# Patient Record
Sex: Female | Born: 1935 | Race: White | Hispanic: No | Smoking: Never smoker
Health system: Southern US, Community
[De-identification: ages and names within clinical notes are randomized; demographics above are authoritative.]

## PROBLEM LIST (undated history)

## (undated) DIAGNOSIS — M719 Bursopathy, unspecified: Secondary | ICD-10-CM

## (undated) HISTORY — DX: Bursopathy, unspecified: M71.9

---

## 2015-01-17 ENCOUNTER — Emergency Department
Admission: EM | Admit: 2015-01-17 | Discharge: 2015-01-17 | Disposition: A | Discharge status: Home / Self Care | Payer: Medicare Other | Attending: Emergency Medicine | Admitting: Emergency Medicine

## 2015-01-17 ENCOUNTER — Emergency Department: Payer: Medicare Other

## 2015-01-17 DIAGNOSIS — S0990XA Unspecified injury of head, initial encounter: Secondary | ICD-10-CM

## 2015-01-17 DIAGNOSIS — S0101XA Laceration without foreign body of scalp, initial encounter: Secondary | ICD-10-CM | POA: Insufficient documentation

## 2015-01-17 DIAGNOSIS — W19XXXA Unspecified fall, initial encounter: Secondary | ICD-10-CM | POA: Insufficient documentation

## 2015-01-17 MED ORDER — ACETAMINOPHEN 325 MG PO TABS
650.0000 mg | ORAL_TABLET | Freq: Once | ORAL | Status: AC
Start: 2015-01-17 — End: 2015-01-17
  Administered 2015-01-17: 650 mg via ORAL
  Filled 2015-01-17: qty 2

## 2015-01-17 NOTE — ED Provider Notes (Signed)
EMERGENCY DEPARTMENT HISTORY AND PHYSICAL EXAM    Date: 01/17/2015  Patient Name: Jessica Patton    History of Presenting Illness     Chief Complaint   Patient presents with   . Fall   . Head Laceration     BACK OF HEAD        History Provided By: Patient   Chief Complaint:  laceration  Onset: Immediately PTA  Timing: constant  Location: scalp (posterior)  Quality: denies pain  Severity:   Modifying Factors: None reported.  Associated Symptoms: Denies paresthesias, weakness in the affected area.  Tetanus UTD.    Additional History: Jessica Patton is a 81 y.o. female who was BIB family members with c/o laceration to the scalp.  This injury occurred when pt sustained mechanical fall, fell backward.  No LOC, neck pain, back pain, amnesia, blood thinners, amnesia of event, or c/o pain    PCP: Pcp, Noneorunknown, MD    No current facility-administered medications for this encounter.     Current Outpatient Prescriptions   Medication Sig Dispense Refill   . Celecoxib (CELEBREX PO) Take by mouth.     . Loratadine (CLARITIN PO) Take by mouth.     . Mirabegron (MYRBETRIQ PO) Take by mouth.       Past History     Past Medical History:  Past Medical History   Diagnosis Date   . Bursitis        Past Surgical History:  Past Surgical History   Procedure Laterality Date   . Back surgery     . Joint replacement       LEFT PARTIAL        Family History:  No family history on file.    Social History:  Social History   Substance Use Topics   . Smoking status: Never Smoker    . Smokeless tobacco: None   . Alcohol Use: Yes       Allergies:  Allergies   Allergen Reactions   . Bacitracin    . Neosporin Wound Cleanser [Benzalkonium Chloride]        Review of Systems   Review of Systems   Constitutional: Negative for fever and chills.   Musculoskeletal: Negative for joint pain and falls.   Skin: Negative for itching and rash.        + for laceration   Neurological: Negative for dizziness, tingling, sensory change, focal weakness and  weakness.   Endo/Heme/Allergies:        Negative for h/o DM     Physical Exam   BP 161/74 mmHg  Pulse 76  Temp(Src) 96.9 F (36.1 C) (Oral)  Resp 18  Ht 5\' 7"  (1.702 m)  Wt 68.04 kg  BMI 23.49 kg/m2  SpO2 98%    CONSTITUTIONAL: Well-developed, well-nourished. Alert, oriented, NAD. Vital signs reviewed. Vital signs stable.  HEAD: Normocephalic.   There is no bony tenderness, deformity, crepitus, or instability noted to skull or facial bones  EENT: PERRL, EOMs intact, red reflex + bilat.  TMs clear with normal cone of light bilat.  No pain with tragus depression or tug test.  No hemotympanum.  MS: No bony tenderness. Full ROM without pain. No edema, erythema.  SKIN: Warm, dry. No rash or lesions. Pt has 1cm linear partial thickness laceration to the scalp.  Edges are tidy and there is no foreign body noted.  2 point discrimination intact distal to wound.  There is no joint space involvement.  There is no  tendon, nerve, or vascular involvement evident during exam.  NEURO: A&O x 3. CN II-XII intact. 5/5 strength in bilateral upper and lower extremities. Sensory intact throughout. Point to point and heel to shin performed without Difficulty. Romberg and pronator drift negative. Gait steady and balanced.  GCS 15.  PSYCH: Normal affect, insight and concentration for age.    Diagnostic Study Results     Labs -     Results     ** No results found for the last 24 hours. **          Radiologic Studies -   Radiology Results (24 Hour)     Procedure Component Value Units Date/Time    CT Head without Contrast [[id number]] Collected:  01/17/15 0114    Order Status:  Completed Updated:  01/17/15 0119    Narrative:      History: Fall posterior head injury.    Comparisons: None.    Technique: Axial CT brain obtained from vertex to skull base without  contrast.     A combination of automatic exposure control, adjustment of the mA and/or  kV according to patient size and/or use of iterative reconstruction  technique was  utilized.    Findings:  Gray-white matter differentiation preserved.        Basal ganglia, thalami, midbrain, pons unremarkable.    No mass, mass effect, or midline shift. No intra-axial or extra-axial  hemorrhage or collection.    Ventricles, sulci, cisterns age-appropriate in size and configuration.    Calvarium appears intact.         Small hematoma left posterior scalp.      Impression:         Small left posterior scalp hematoma.    Adaline Sill, MD   01/17/2015 1:15 AM          Medical Decision Making   I am the first provider for this patient.    I reviewed the available nursing notes, past medical history, past surgical history, family history and social history.       Patient Vitals for the past 12 hrs:   BP Temp Pulse Resp   01/17/15 0011 161/74 mmHg (!) 96.9 F (36.1 C) 76 18   01/17/15 0007 - - - 18     Pulse Oximetry Analysis - Normal 98% on RA      Provider Notes:   Simple laceration noted to scalp.  Repaired without difficulty and with good cosmesis.  Tetanus UTD.  Pt educated on home wound care, when to return for suture removal.  Stable to d/c home at this time.  Dr. Layla Barter in agreement with POC.  Pt educated on return precautions.  D/C instructions provided to pt - verbalized understanding.  Business card provided to call with any questions or concerns.  Educated on home care post-head injury and importance of close monitoring.  Neuro exam non focal  Throughout ED visit and CT neg.     Lac Repair  Date/Time: 01/17/2015 2:07 AM  Performed by: Charlton Haws  Authorized by: Tempie Donning B    Consent:     Consent obtained:  Verbal    Consent given by:  Patient    Risks discussed:  Infection, pain, poor cosmetic result, poor wound healing, vascular damage, need for additional repair and nerve damage  Universal protocol:     Procedure explained and questions answered to patient or proxy's satisfaction: yes      Site/side marked: yes  Immediately prior to procedure, a time out was  called: yes      Patient identity confirmed:  Arm band  Anesthesia (see MAR for exact dosages):     Anesthesia method:  Local infiltration    Local anesthetic:  Lidocaine 1% WITH epi  Laceration details:     Location:  Scalp    Scalp location:  Occipital    Length (cm):  0.5    Depth (mm):  1  Repair type:     Repair type:  Simple  Exploration:     Hemostasis achieved with:  Direct pressure    Wound exploration: entire depth of wound probed and visualized      Wound extent: no foreign bodies/material noted, no muscle damage noted, no nerve damage noted, no tendon damage noted, no underlying fracture noted and no vascular damage noted      Contaminated: no    Treatment:     Area cleansed with:  Saline and Shur-Clens    Amount of cleaning:  Standard    Irrigation solution:  Sterile saline    Irrigation method:  Syringe    Visualized foreign bodies/material removed: no    Skin repair:     Repair method:  Staples    Number of staples:  4  Approximation:     Approximation:  Close    Diagnosis     Clinical Impression:   1. Scalp laceration, initial encounter    2. Head injury, initial encounter        _______________________________              Charlton Haws, NP  01/17/15 0221    Jethro Bastos, MD  01/17/15 [phone]

## 2015-01-17 NOTE — Discharge Instructions (Signed)
Thank you for trusting us with your care today, and thank you for your patience while you have been here in the emergency department.  Please do not hesitate to call or return if you have any questions or concerns, or for any change in status.    Laceration, Scalp    You have been seen today and treated for a scalp laceration (cut).    The wound has been repaired with surgical staples.    Follow up with your doctor OR come back here or go to the nearest Emergency Department for staple/suture removal in:   10 days.    Use the following wound care instructions:   Keep the wound clean and dry for the next 24 hours. Avoid excessive moisture. You can wash the wound gently with soap and water and Then keep the area as clean and dry as possible.   DO NOT let your wound to soak in water (bathing in a bathtub, using a hot tub or going swimming, for example).   You can shower. However, be careful not to rub your staples/stitches too hard (do not be too abrasive).   Let the wound dry before putting another bandage on (if using a bandage).   To help take off a scab, cleanse the area with a half hydrogen peroxide/half water mix. This will also help us to take out the staples/sutures when they are ready to be taken out.   Unless you were told not to, you can put a thin layer of antibiotic ointment over the wound. You can buy Polysporin, Bacitracin, or Neosporin over the counter (without a prescription). Neosporin can sometimes irritate the skin. If this happens, stop using it and switch to another topical antibiotic.    Keep the affected area elevated for the next 24 hours to lower swelling and pain. You may also want to put ice on the area. Put some ice cubes in a re-sealable (Ziploc) bag and add some water. Put a thin washcloth between the bag and the skin. Apply the ice bag to the area for at least 20 minutes. Do this at least 4 times per day. Longer times and more often are OK. NEVER APPLY ICE DIRECTLY TO THE  SKIN.    If you had a local anesthetic, it will wear off in about 2 hours. Until then, be careful not to hurt yourself because of less feeling in the area.    YOU SHOULD SEEK MEDICAL ATTENTION IMMEDIATELY, EITHER HERE OR AT THE NEAREST EMERGENCY DEPARTMENT, IF ANY OF THE FOLLOWING OCCURS:   Unusual redness or swelling.   Red streaks or red patches around the wound.   The wound smells bad or has a lot of drainage.   Fever (temperature higher than 100.4F / 38C), chills, increasing pain or swelling.    Head Injury, NOS    You have been seen for a head injury.    A head injury can happen after something strikes the head or as a result of a fall or other injury. Head injuries can range from mild injuries to more severe injuries. The more severe injuries can result in broken bones or injury to the brain itself. Mild head injuries will show no abnormalities if a CT (CAT) scan of the brain is done.     Although you had an injury to your head, you do not seem to have a serious brain injury.     Head injury symptoms can last from hours to months. The time depends   on how bad the injury was. It also depends on whether you ve had a concussion in the past. Some problems with a concussion can include: Sleep, memory and concentration problems. They also include chronic (ongoing) headaches and sensitivity to light. These symptoms can happen soon after the concussion. They can also develop slowly over time. They can last up to a year. When this happens, it is called "post concussion syndrome."    If you develop "post-concussive syndrome," you should follow up with your doctor. Your doctor can care for you or provide a referral to a head-injury specialist.    Treatment includes observation at home and pain medicine like acetaminophen (Tylenol) or ibuprofen (Advil or Motrin). Prescription pain medicine is probably not needed.    You might have a mild headache for a few days.    Over the next 24 hours:   Stay with  family or friends who can watch your behavior.   Avoid alcohol or drugs.    YOU SHOULD SEEK MEDICAL ATTENTION IMMEDIATELY, EITHER HERE OR AT THE NEAREST EMERGENCY DEPARTMENT, IF ANY OF THE FOLLOWING OCCURS:   Your headache gets worse.   Your headache pain changes.   You have fever (temperature higher than 100.4F / 38C), neck pain, vision changes, difficulty walking or change of behavior.   You feel numbness, tingling, weakness in your arms or legs.   You faint.   Your vision changes.   You vomit often or cannot keep medicine down.   You are confused or have difficulty waking from sleep.

## 2015-01-17 NOTE — ED Notes (Signed)
Jessica Patton is a 81 y.o. female FROM HOME C/O FALL AND LAC TO BACK OF HEAD, NO LOC.  PT TRIPPED AND  FELL BACKWARDS ONTO GRANITE BAR.  NO LOC . DENIES DIZZINESS .

## 2019-07-29 ENCOUNTER — Emergency Department (HOSPITAL_COMMUNITY)
Admission: EM | Admit: 2019-07-29 | Discharge: 2019-07-29 | Disposition: A | Discharge status: Home / Self Care | Payer: Medicare Other | Attending: Emergency Medicine | Admitting: Emergency Medicine

## 2019-07-29 ENCOUNTER — Other Ambulatory Visit: Payer: Self-pay

## 2019-07-29 ENCOUNTER — Encounter (HOSPITAL_COMMUNITY): Payer: Self-pay | Admitting: Emergency Medicine

## 2019-07-29 ENCOUNTER — Emergency Department (HOSPITAL_COMMUNITY): Payer: Medicare Other

## 2019-07-29 DIAGNOSIS — M25551 Pain in right hip: Secondary | ICD-10-CM | POA: Diagnosis not present

## 2019-07-29 DIAGNOSIS — Y939 Activity, unspecified: Secondary | ICD-10-CM | POA: Diagnosis not present

## 2019-07-29 DIAGNOSIS — Y999 Unspecified external cause status: Secondary | ICD-10-CM | POA: Insufficient documentation

## 2019-07-29 DIAGNOSIS — Z79899 Other long term (current) drug therapy: Secondary | ICD-10-CM | POA: Diagnosis not present

## 2019-07-29 DIAGNOSIS — R519 Headache, unspecified: Secondary | ICD-10-CM | POA: Insufficient documentation

## 2019-07-29 DIAGNOSIS — Y92015 Private garage of single-family (private) house as the place of occurrence of the external cause: Secondary | ICD-10-CM | POA: Insufficient documentation

## 2019-07-29 DIAGNOSIS — W1830XA Fall on same level, unspecified, initial encounter: Secondary | ICD-10-CM | POA: Diagnosis not present

## 2019-07-29 DIAGNOSIS — W19XXXA Unspecified fall, initial encounter: Secondary | ICD-10-CM

## 2019-07-29 DIAGNOSIS — M25552 Pain in left hip: Secondary | ICD-10-CM | POA: Insufficient documentation

## 2019-07-29 DIAGNOSIS — S0990XA Unspecified injury of head, initial encounter: Secondary | ICD-10-CM

## 2019-07-29 DIAGNOSIS — T07XXXA Unspecified multiple injuries, initial encounter: Secondary | ICD-10-CM | POA: Diagnosis present

## 2019-07-29 LAB — CBC
HCT: 42.4 % (ref 36.0–46.0)
Hemoglobin: 13.6 g/dL (ref 12.0–15.0)
MCH: 30.3 pg (ref 26.0–34.0)
MCHC: 32.1 g/dL (ref 30.0–36.0)
MCV: 94.4 fL (ref 80.0–100.0)
Platelets: 213 10*3/uL (ref 150–400)
RBC: 4.49 MIL/uL (ref 3.87–5.11)
RDW: 14.1 % (ref 11.5–15.5)
WBC: 6.9 10*3/uL (ref 4.0–10.5)
nRBC: 0 % (ref 0.0–0.2)

## 2019-07-29 LAB — BASIC METABOLIC PANEL
Anion gap: 8 (ref 5–15)
BUN: 18 mg/dL (ref 8–23)
CO2: 26 mmol/L (ref 22–32)
Calcium: 9.4 mg/dL (ref 8.9–10.3)
Chloride: 103 mmol/L (ref 98–111)
Creatinine, Ser: 0.85 mg/dL (ref 0.44–1.00)
GFR calc Af Amer: >60 mL/min (ref >60)
GFR calc non Af Amer: >60 mL/min (ref >60)
Glucose, Bld: 103 mg/dL — ABNORMAL HIGH (ref 70–99)
Potassium: 4.1 mmol/L (ref 3.5–5.1)
Sodium: 137 mmol/L (ref 135–145)

## 2019-07-29 LAB — CBG MONITORING, ED: Glucose-Capillary: 90 mg/dL (ref 70–99)

## 2019-07-29 MED ORDER — SODIUM CHLORIDE 0.9% FLUSH: 3.0000 mL | Freq: Once | INTRAVENOUS | Status: DC

## 2019-07-29 NOTE — ED Provider Notes (Signed)
[facility] EMERGENCY DEPARTMENT Provider Note   CSN: [id number] Arrival date & time: 07/29/19  1131     History Chief Complaint  Patient presents with  . Fall    Marie Mitchell is a 86 y.o. female who presents to the ED today with complaint of mechanical fall that occurred last night. Patient reports that she was in the garage looking at a balance machine that her husband had bought. She states that this caused her to fall backwards and land directly onto her bottom and then hit the back of her head. No loss of consciousness. Patient states she was able to get up on her own afterwards. He states that she went to bed and immediately. She states when she woke today she was very stiff. She is currently complaining of a very mild headache as well as pain in her bilateral hips. She states that she wanted to make sure she was okay to make sure that she did not have a big bruise on her backside. Patient is not anticoagulated. She has been able to ambulate. She is currently being prescribed tramadol for back and hip pain. Recent xrays on 05/03 of L spine with findings of s/p spinal fusion at L4-L5.  Hardware intact. Severe DDD at L3-L4. Xray of hips with degenerative changes.   The history is provided by the patient and medical records.       History reviewed. No pertinent past medical history.  There are no problems to display for this patient.    OB History   No obstetric history on file.     History reviewed. No pertinent family history.  Social History   Tobacco Use  . Smoking status: Not on file  Substance Use Topics  . Alcohol use: Not on file  . Drug use: Not on file    Home Medications Prior to Admission medications   Medication Sig Start Date End Date Taking? Authorizing Provider  calcium carbonate (OSCAL) 1500 (600 Ca) MG TABS tablet Take 1,200 mg by mouth daily with breakfast.   Yes [provider]  celecoxib (CELEBREX) 200 MG capsule Take  200 mg by mouth daily.   Yes [provider]  Cholecalciferol 25 MCG (1000 UT) tablet Take 1,000 Units by mouth in the morning and at bedtime.   Yes [provider]  estradiol (ESTRING) 2 MG vaginal ring Place 2 mg vaginally every 3 (three) months.   Yes [provider]  FLUoxetine (PROZAC) 20 MG capsule Take 20 mg by mouth daily.   Yes [provider]  mirabegron ER (MYRBETRIQ) 50 MG TB24 tablet Take 50 mg by mouth daily.   Yes [provider]  traMADol (ULTRAM) 50 MG tablet Take 50 mg by mouth 2 (two) times daily as needed for pain. 07/25/19  Yes [provider]  Turmeric 500 MG TABS Take 500 mg by mouth daily.   Yes [provider]    Allergies    Bacitracin  Review of Systems   Review of Systems  Constitutional: Negative for chills and fever.  Eyes: Negative for visual disturbance.  Musculoskeletal: Positive for arthralgias.  Neurological: Positive for headaches. Negative for dizziness, syncope and light-headedness.  All other systems reviewed and are negative.   Physical Exam Updated Vital Signs BP 135/73   Pulse 69   Temp 98.6 F (37 C) (Oral)   Resp 16   Ht 5' 6.5" (1.689 m)   Wt 65.8 kg   SpO2 96%   BMI  23.05 kg/m   Physical Exam Vitals and nursing note reviewed.  Constitutional:      Appearance: She is not ill-appearing or diaphoretic.  HENT:     Head: Normocephalic and atraumatic.     Comments: No raccoon's sign or battle's sign. Negative hemotympanum bilaterally.  Eyes:     Extraocular Movements: Extraocular movements intact.     Conjunctiva/sclera: Conjunctivae normal.     Pupils: Pupils are equal, round, and reactive to light.  Cardiovascular:     Rate and Rhythm: Normal rate and regular rhythm.     Pulses: Normal pulses.  Pulmonary:     Effort: Pulmonary effort is normal.     Breath sounds: Normal breath sounds. No wheezing, rhonchi or rales.  Abdominal:     Palpations: Abdomen is soft.      Tenderness: There is no abdominal tenderness. There is no guarding or rebound.  Musculoskeletal:     Cervical back: Neck supple.     Comments: No C, T, or L midline spinal TTP. + mild TTP to bilateral hips. No pain illicited with log roll or flexion of hips. No overlying skin changes including ecchymosis to the back, hips, or buttocks. Strength 5/5 to BLEs. Sensation intact throughout. 2+ DP pulses bilaterally.   Skin:    General: Skin is warm and dry.  Neurological:     Mental Status: She is alert.     ED Results / Procedures / Treatments   Labs (all labs ordered are listed, but only abnormal results are displayed) Labs Reviewed  BASIC METABOLIC PANEL - Abnormal; Notable for the following components:      Result Value   Glucose, Bld 103 (*)    All other components within normal limits  CBC  CBG MONITORING, ED    EKG None  Radiology DG Hips Bilat W or Wo Pelvis 3-4 Views  Result Date: 07/29/2019 CLINICAL DATA:  Hip pain status post fall EXAM: DG HIP (WITH OR WITHOUT PELVIS) 3-4V BILAT COMPARISON:  None. FINDINGS: There is no evidence of hip fracture or dislocation. There is no evidence of arthropathy or other focal bone abnormality. IMPRESSION: Negative. Electronically Signed   By: Katherine Mantle M.D.   On: 07/29/2019 14:49    Procedures Procedures (including critical care time)  Medications Ordered in ED Medications  sodium chloride flush (NS) 0.9 % injection 3 mL (has no administration in time range)    ED Course  I have reviewed the triage vital signs and the nursing notes.  Pertinent labs & imaging results that were available during my care of the patient were reviewed by me and considered in my medical decision making (see chart for details).    MDM Rules/Calculators/A&P                      87 year old female presents the ED today after mechanical fall that occurred last night. Currently complaining of very mild headache, no loss of consciousness last night  patient is not anticoagulated. As well as bilateral hip pain however patient has chronic bilateral hip pain and is currently taking tramadol at home for it. She did have x-rays done on 0503 by PCP for her hip pain without any acute findings. To the ED patient is afebrile, nontachycardic nontachypneic. She appears to be in no acute distress. He has no focal neuro deficits on exam today. No signs of basilar skull fractures. Has no overlying skin changes to her back buttocks or hips. She states she was concerned  that she could have a big bruise. She has some mild bilateral tenderness palpation of her hips. Will obtain x-rays of her bilateral pelvis as well as obtain CT head given fall yesterday. Lab work was obtained while patient was in the waiting room, no acute findings on CBC or BMP.   X-rays negative of hip. Still awaiting CT scan of head. Anticipate discharge home if no acute findings on CT scan.   3:34 PM At shift change case signed out to [employer], PA-C, who will dispo patient after CT scan returns.   Final Clinical Impression(s) / ED Diagnoses Final diagnoses:  Fall, initial encounter  Injury of head, initial encounter  Bilateral hip pain    Rx / DC Orders ED Discharge Orders    None       Tanda Rockers, PA-C 07/29/19 1535    Benjiman Core, MD 07/29/19 [phone]

## 2019-07-29 NOTE — ED Triage Notes (Signed)
Pt arrives to ED from home with complaints of a fall last night onto a concrete floor. Patient states she fell backwards and hit her head. Patient states that this morning her head, back, and both hips hurt. Patient denies vision problems.

## 2019-07-29 NOTE — Discharge Instructions (Addendum)
Your imaging was reassuring today. There were no broken bones seen or bruising or bleeding in your skull. Continue taking your pain medication as needed.  Follow up with your PCP for further evaluation.

## 2019-07-29 NOTE — ED Provider Notes (Signed)
6:42 PM Signout from [employer] at shift change.  Patient presents after a fall for of bilateral hip pain.  She also hit the back of her head.  Imaging of the hips was negative.  Pending head CT.  Head CT with chronic changes but no acute injury.  Patient does not have any concussion symptoms at this point.  We did briefly discuss signs and symptoms of concussion and need to follow-up if these occur.  Patient and family at bedside are comfortable with discharged home at this time.  Patient has been prescribed tramadol which she has taken several times this week and tolerated well.  No history of seizures.  BP 119/85   Pulse 77   Temp 98.3 F (36.8 C) (Oral)   Resp 18   Ht 5' 6.5" (1.689 m)   Wt 65.8 kg   SpO2 99%   BMI 23.05 kg/m    Patient urged to return with worsening symptoms or other concerns. Patient verbalized understanding and agrees with plan.      Renne Crigler, PA-C 07/29/19 1846    Sabas Sous, MD 08/04/19 Rich Fuchs

## 2019-07-29 NOTE — ED Notes (Signed)
Patient verbalizes understanding of discharge instructions. Opportunity for questioning and answers were provided. Armband removed by staff, pt discharged from ED.  

## 2021-02-08 ENCOUNTER — Emergency Department (HOSPITAL_BASED_OUTPATIENT_CLINIC_OR_DEPARTMENT_OTHER): Payer: Medicare Other | Admitting: Radiology

## 2021-02-08 ENCOUNTER — Emergency Department (HOSPITAL_BASED_OUTPATIENT_CLINIC_OR_DEPARTMENT_OTHER)
Admission: EM | Admit: 2021-02-08 | Discharge: 2021-02-08 | Disposition: A | Discharge status: Home / Self Care | Payer: Medicare Other | Attending: Emergency Medicine | Admitting: Emergency Medicine

## 2021-02-08 ENCOUNTER — Other Ambulatory Visit: Payer: Self-pay

## 2021-02-08 ENCOUNTER — Emergency Department (HOSPITAL_BASED_OUTPATIENT_CLINIC_OR_DEPARTMENT_OTHER): Payer: Medicare Other

## 2021-02-08 ENCOUNTER — Encounter (HOSPITAL_BASED_OUTPATIENT_CLINIC_OR_DEPARTMENT_OTHER): Payer: Self-pay | Admitting: *Deleted

## 2021-02-08 DIAGNOSIS — M25552 Pain in left hip: Secondary | ICD-10-CM | POA: Insufficient documentation

## 2021-02-08 DIAGNOSIS — W01198A Fall on same level from slipping, tripping and stumbling with subsequent striking against other object, initial encounter: Secondary | ICD-10-CM | POA: Diagnosis not present

## 2021-02-08 DIAGNOSIS — W19XXXA Unspecified fall, initial encounter: Secondary | ICD-10-CM

## 2021-02-08 DIAGNOSIS — M25531 Pain in right wrist: Secondary | ICD-10-CM | POA: Insufficient documentation

## 2021-02-08 DIAGNOSIS — S0001XA Abrasion of scalp, initial encounter: Secondary | ICD-10-CM | POA: Insufficient documentation

## 2021-02-08 DIAGNOSIS — S0990XA Unspecified injury of head, initial encounter: Secondary | ICD-10-CM | POA: Diagnosis present

## 2021-02-08 NOTE — Discharge Instructions (Signed)
Take Tylenol or Motrin as needed for pain.  Follow-up with your primary doctor.  Return back to ER as needed.

## 2021-02-08 NOTE — ED Notes (Signed)
No acute distress noted upon this RN's departure of patient. Verified discharge paperwork with name and DOB. Vital signs stable. Discharge paperwork discussed with patient and family. No further questions voiced upon discharge.

## 2021-02-08 NOTE — ED Triage Notes (Signed)
Pt reports she fell in her closet last night and hit her head. Denies LOC. States her right wrist and left hip are hurting. Pt is here with her caregiver who has to leave. States pt's family is on the way. Pt alert, NAD

## 2021-02-08 NOTE — ED Provider Notes (Signed)
[facility]ENTER [facility] EMERGENCY DEPT Provider Note   CSN: [id number] Arrival date & time: 02/08/21  1218     History Chief Complaint  Patient presents with   Marie Mitchell is a 87 y.o. female.  Presents to ER with concern for fall.  Patient reports that she fell in the closet last night.  Believes that she hit her head.  Did not lose consciousness.  Had some pain in her right wrist and left hip previously however currently she does not have any pain.  Not on blood thinners.  Has been ambulatory since the fall.  No nausea or vomiting or significant headache.  HPI     No past medical history on file.  There are no problems to display for this patient.  OB History   No obstetric history on file.     No family history on file.  Social History   Tobacco Use   Smoking status: Never   Smokeless tobacco: Never  Substance Use Topics   Alcohol use: Yes    Comment: occasional wine    Home Medications Prior to Admission medications   Medication Sig Start Date End Date Taking? Authorizing Provider  calcium carbonate (OSCAL) 1500 (600 Ca) MG TABS tablet Take 1,200 mg by mouth daily with breakfast.    [provider]  celecoxib (CELEBREX) 200 MG capsule Take 200 mg by mouth daily.    [provider]  Cholecalciferol 25 MCG (1000 UT) tablet Take 1,000 Units by mouth in the morning and at bedtime.    [provider]  estradiol (ESTRING) 2 MG vaginal ring Place 2 mg vaginally every 3 (three) months.    [provider]  FLUoxetine (PROZAC) 20 MG capsule Take 20 mg by mouth daily.    [provider]  mirabegron ER (MYRBETRIQ) 50 MG TB24 tablet Take 50 mg by mouth daily.    [provider]  traMADol (ULTRAM) 50 MG tablet Take 50 mg by mouth 2 (two) times daily as needed for pain. 07/25/19   [provider]  Turmeric 500 MG TABS Take 500 mg by mouth daily.    [provider]    Allergies     Bacitracin  Review of Systems   Review of Systems  Constitutional:  Negative for chills and fever.  HENT:  Negative for ear pain and sore throat.   Eyes:  Negative for pain and visual disturbance.  Respiratory:  Negative for cough and shortness of breath.   Cardiovascular:  Negative for chest pain and palpitations.  Gastrointestinal:  Negative for abdominal pain and vomiting.  Genitourinary:  Negative for dysuria and hematuria.  Musculoskeletal:  Positive for arthralgias. Negative for back pain.  Skin:  Negative for color change and rash.  Neurological:  Negative for seizures and syncope.  All other systems reviewed and are negative.  Physical Exam Updated Vital Signs BP 138/70 (BP Location: Left Arm)   Pulse 66   Temp 98.1 F (36.7 C)   Resp 16   Ht 5\' 7"  (1.702 m)   Wt 59 kg   SpO2 99%   BMI 20.36 kg/m   Physical Exam Vitals and nursing note reviewed.  Constitutional:      General: She is not in acute distress.    Appearance: She is well-developed.  HENT:     Head: Normocephalic.     Comments: Superficial abrasion to posterior occiput, no significant laceration appreciated Eyes:     Conjunctiva/sclera: Conjunctivae normal.  Cardiovascular:  Rate and Rhythm: Normal rate and regular rhythm.     Heart sounds: No murmur heard. Pulmonary:     Effort: Pulmonary effort is normal. No respiratory distress.     Breath sounds: Normal breath sounds.  Abdominal:     Palpations: Abdomen is soft.     Tenderness: There is no abdominal tenderness.  Musculoskeletal:        General: No swelling.     Cervical back: Neck supple.     Comments: Back: no C, T, L spine TTP, no step off or deformity RUE: no TTP throughout, no deformity, normal joint ROM, radial pulse intact, distal sensation and motor intact LUE: no TTP throughout, no deformity, normal joint ROM, radial pulse intact, distal sensation and motor intact RLE:  no TTP throughout, no deformity, normal joint ROM, distal  pulse, sensation and motor intact LLE: no TTP throughout, no deformity, normal joint ROM, distal pulse, sensation and motor intact  Skin:    General: Skin is warm and dry.     Capillary Refill: Capillary refill takes less than 2 seconds.  Neurological:     Mental Status: She is alert.  Psychiatric:        Mitchell and Affect: Mitchell normal.    ED Results / Procedures / Treatments   Labs (all labs ordered are listed, but only abnormal results are displayed) Labs Reviewed - No data to display  EKG None  Radiology DG Wrist Complete Right  Result Date: 02/08/2021 CLINICAL DATA:  Right wrist pain after fall last night. EXAM: RIGHT WRIST - COMPLETE 3+ VIEW COMPARISON:  None. FINDINGS: There is no evidence of fracture or dislocation. Severe degenerative changes seen involving the first carpometacarpal joint. Soft tissues are unremarkable. IMPRESSION: Severe osteoarthritis of the first carpometacarpal joint. No acute abnormality is noted. Electronically Signed   By: Marijo Conception M.D.   On: 02/08/2021 16:00   CT Head Wo Contrast  Result Date: 02/08/2021 CLINICAL DATA:  87 year old female with history of trauma from a fall with injury to the head yesterday evening. EXAM: CT HEAD WITHOUT CONTRAST CT CERVICAL SPINE WITHOUT CONTRAST TECHNIQUE: Multidetector CT imaging of the head and cervical spine was performed following the standard protocol without intravenous contrast. Multiplanar CT image reconstructions of the cervical spine were also generated. COMPARISON:  Head CT 07/29/2019. FINDINGS: CT HEAD FINDINGS Brain: Mild cerebral atrophy. Patchy and confluent areas of decreased attenuation are noted throughout the deep and periventricular white matter of the cerebral hemispheres bilaterally, compatible with chronic microvascular ischemic disease. No evidence of acute infarction, hemorrhage, hydrocephalus, extra-axial collection or mass lesion/mass effect. Vascular: No hyperdense vessel or unexpected  calcification. Skull: Normal. Negative for fracture or focal lesion. Sinuses/Orbits: No acute finding. Other: None. CT CERVICAL SPINE FINDINGS Alignment: Mild reversal of normal cervical lordosis centered at the level of C5-C6, likely chronic and degenerative in nature or positional. Alignment is otherwise anatomic. Skull base and vertebrae: No acute fracture. No primary bone lesion or focal pathologic process. Soft tissues and spinal canal: No prevertebral fluid or swelling. No visible canal hematoma. Disc levels: Multilevel degenerative disc disease, most evident at C5-C6 and C6-C7. Multilevel facet arthropathy. Upper chest: Mild scarring in the apices of the lungs bilaterally. Other: None. IMPRESSION: 1. No evidence of significant acute traumatic injury to the skull, brain or cervical spine. 2. Mild cerebral atrophy with chronic microvascular ischemic changes in the cerebral white matter, as above. 3. Multilevel degenerative disc disease and cervical spondylosis, as above. Electronically Signed  By: Vinnie Langton M.D.   On: 02/08/2021 15:53   CT Cervical Spine Wo Contrast  Result Date: 02/08/2021 CLINICAL DATA:  87 year old female with history of trauma from a fall with injury to the head yesterday evening. EXAM: CT HEAD WITHOUT CONTRAST CT CERVICAL SPINE WITHOUT CONTRAST TECHNIQUE: Multidetector CT imaging of the head and cervical spine was performed following the standard protocol without intravenous contrast. Multiplanar CT image reconstructions of the cervical spine were also generated. COMPARISON:  Head CT 07/29/2019. FINDINGS: CT HEAD FINDINGS Brain: Mild cerebral atrophy. Patchy and confluent areas of decreased attenuation are noted throughout the deep and periventricular white matter of the cerebral hemispheres bilaterally, compatible with chronic microvascular ischemic disease. No evidence of acute infarction, hemorrhage, hydrocephalus, extra-axial collection or mass lesion/mass effect.  Vascular: No hyperdense vessel or unexpected calcification. Skull: Normal. Negative for fracture or focal lesion. Sinuses/Orbits: No acute finding. Other: None. CT CERVICAL SPINE FINDINGS Alignment: Mild reversal of normal cervical lordosis centered at the level of C5-C6, likely chronic and degenerative in nature or positional. Alignment is otherwise anatomic. Skull base and vertebrae: No acute fracture. No primary bone lesion or focal pathologic process. Soft tissues and spinal canal: No prevertebral fluid or swelling. No visible canal hematoma. Disc levels: Multilevel degenerative disc disease, most evident at C5-C6 and C6-C7. Multilevel facet arthropathy. Upper chest: Mild scarring in the apices of the lungs bilaterally. Other: None. IMPRESSION: 1. No evidence of significant acute traumatic injury to the skull, brain or cervical spine. 2. Mild cerebral atrophy with chronic microvascular ischemic changes in the cerebral white matter, as above. 3. Multilevel degenerative disc disease and cervical spondylosis, as above. Electronically Signed   By: Vinnie Langton M.D.   On: 02/08/2021 15:53   DG Hip Unilat With Pelvis 2-3 Views Left  Result Date: 02/08/2021 CLINICAL DATA:  Left hip pain after fall last night. EXAM: DG HIP (WITH OR WITHOUT PELVIS) 2-3V LEFT COMPARISON:  Jul 29, 2019. FINDINGS: There is no evidence of hip fracture or dislocation. There is no evidence of arthropathy or other focal bone abnormality. IMPRESSION: Negative. Electronically Signed   By: Marijo Conception M.D.   On: 02/08/2021 15:58    Procedures Procedures   Medications Ordered in ED Medications - No data to display  ED Course  I have reviewed the triage vital signs and the nursing notes.  Pertinent labs & imaging results that were available during my care of the patient were reviewed by me and considered in my medical decision making (see chart for details).    MDM Rules/Calculators/A&P                           87 year old lady presents to ER after fall last night.  Reported hitting her head.  Initially had also reported left hip pain and right wrist pain.  CT head, plain films of left hip and right wrist were negative for acute traumatic process.  When I evaluated patient, she did not have any extremity pain.  No traumatic findings were identified on exam except for abrasion to her posterior occiput.  No active bleeding, no significant laceration requiring repair.  Discharged home with family.  After the discussed management above, the patient was determined to be safe for discharge.  The patient was in agreement with this plan and all questions regarding their care were answered.  ED return precautions were discussed and the patient will return to the ED with any significant worsening  of condition.   Final Clinical Impression(s) / ED Diagnoses Final diagnoses:  Fall, initial encounter  Abrasion of scalp, initial encounter    Rx / DC Orders ED Discharge Orders     None        Lucrezia Starch, MD 02/08/21 1850

## 2021-04-22 ENCOUNTER — Emergency Department (HOSPITAL_BASED_OUTPATIENT_CLINIC_OR_DEPARTMENT_OTHER): Payer: Medicare Other | Admitting: Radiology

## 2021-04-22 ENCOUNTER — Emergency Department (HOSPITAL_BASED_OUTPATIENT_CLINIC_OR_DEPARTMENT_OTHER): Payer: Medicare Other

## 2021-04-22 ENCOUNTER — Emergency Department (HOSPITAL_BASED_OUTPATIENT_CLINIC_OR_DEPARTMENT_OTHER)
Admission: EM | Admit: 2021-04-22 | Discharge: 2021-04-23 | Disposition: A | Discharge status: Home / Self Care | Payer: Medicare Other | Attending: Emergency Medicine | Admitting: Emergency Medicine

## 2021-04-22 ENCOUNTER — Other Ambulatory Visit: Payer: Self-pay

## 2021-04-22 DIAGNOSIS — M25511 Pain in right shoulder: Secondary | ICD-10-CM | POA: Insufficient documentation

## 2021-04-22 DIAGNOSIS — S0101XA Laceration without foreign body of scalp, initial encounter: Secondary | ICD-10-CM | POA: Insufficient documentation

## 2021-04-22 DIAGNOSIS — W19XXXA Unspecified fall, initial encounter: Secondary | ICD-10-CM

## 2021-04-22 DIAGNOSIS — S51012A Laceration without foreign body of left elbow, initial encounter: Secondary | ICD-10-CM | POA: Insufficient documentation

## 2021-04-22 DIAGNOSIS — W01198A Fall on same level from slipping, tripping and stumbling with subsequent striking against other object, initial encounter: Secondary | ICD-10-CM | POA: Diagnosis not present

## 2021-04-22 DIAGNOSIS — R410 Disorientation, unspecified: Secondary | ICD-10-CM | POA: Diagnosis not present

## 2021-04-22 DIAGNOSIS — Y92002 Bathroom of unspecified non-institutional (private) residence single-family (private) house as the place of occurrence of the external cause: Secondary | ICD-10-CM | POA: Diagnosis not present

## 2021-04-22 DIAGNOSIS — T1490XA Injury, unspecified, initial encounter: Secondary | ICD-10-CM

## 2021-04-22 DIAGNOSIS — Z20822 Contact with and (suspected) exposure to covid-19: Secondary | ICD-10-CM | POA: Diagnosis not present

## 2021-04-22 DIAGNOSIS — S0990XA Unspecified injury of head, initial encounter: Secondary | ICD-10-CM | POA: Diagnosis present

## 2021-04-22 DIAGNOSIS — Z87440 Personal history of urinary (tract) infections: Secondary | ICD-10-CM | POA: Insufficient documentation

## 2021-04-22 LAB — ETHANOL: Alcohol, Ethyl (B): <10 mg/dL (ref <10)

## 2021-04-22 LAB — COMPREHENSIVE METABOLIC PANEL
ALT: 14 U/L (ref 0–44)
AST: 19 U/L (ref 15–41)
Albumin: 3.8 g/dL (ref 3.5–5.0)
Alkaline Phosphatase: 41 U/L (ref 38–126)
Anion gap: 7 (ref 5–15)
BUN: 21 mg/dL (ref 8–23)
CO2: 28 mmol/L (ref 22–32)
Calcium: 9.5 mg/dL (ref 8.9–10.3)
Chloride: 105 mmol/L (ref 98–111)
Creatinine, Ser: 0.84 mg/dL (ref 0.44–1.00)
GFR, Estimated: >60 mL/min (ref >60)
Glucose, Bld: 130 mg/dL — ABNORMAL HIGH (ref 70–99)
Potassium: 3.5 mmol/L (ref 3.5–5.1)
Sodium: 140 mmol/L (ref 135–145)
Total Bilirubin: 0.4 mg/dL (ref 0.3–1.2)
Total Protein: 6.3 g/dL — ABNORMAL LOW (ref 6.5–8.1)

## 2021-04-22 LAB — CBC
HCT: 38.9 % (ref 36.0–46.0)
Hemoglobin: 12.5 g/dL (ref 12.0–15.0)
MCH: 29.9 pg (ref 26.0–34.0)
MCHC: 32.1 g/dL (ref 30.0–36.0)
MCV: 93.1 fL (ref 80.0–100.0)
Platelets: 267 10*3/uL (ref 150–400)
RBC: 4.18 MIL/uL (ref 3.87–5.11)
RDW: 14.2 % (ref 11.5–15.5)
WBC: 7.6 10*3/uL (ref 4.0–10.5)
nRBC: 0 % (ref 0.0–0.2)

## 2021-04-22 LAB — PROTIME-INR
INR: 0.9 (ref 0.8–1.2)
Prothrombin Time: 12.2 seconds (ref 11.4–15.2)

## 2021-04-22 LAB — RESP PANEL BY RT-PCR (FLU A&B, COVID) ARPGX2
Influenza A by PCR: NEGATIVE
Influenza B by PCR: NEGATIVE
SARS Coronavirus 2 by RT PCR: NEGATIVE

## 2021-04-22 MED ORDER — LIDOCAINE-EPINEPHRINE (PF) 2 %-1:200000 IJ SOLN
10.0000 mL | Freq: Once | INTRAMUSCULAR | Status: AC
  Administered 2021-04-22: 10 mL
  Filled 2021-04-22: qty 20

## 2021-04-22 NOTE — ED Provider Notes (Signed)
[facility] EMERGENCY DEPT Provider Note   CSN: [id number] Arrival date & time: 04/22/21  2036     History  Chief Complaint  Patient presents with   Marie Mitchell is a 88 y.o. female.   Fall   88 year old female with medical history significant for vitamin D deficiency who presents to the emergency department after a fall.  The patient states that she occasionally will lose her balance and fall at home.  She denies a history of stroke.  She denies any acute neurologic deficits.  She states that she lost her balance and struck her head on the commode earlier today.  She denied any loss of consciousness.  She endorses some pain in her right shoulder.  She sustained a skin tear to her left elbow.  She is not sure of her last tetanus status but thinks it was within the past few years.  On chart review, the patient's tetanus was updated in 2021.  She arrived to the [facility] emergency department GCS 15, ABC intact.  Home Medications Prior to Admission medications   Medication Sig Start Date End Date Taking? Authorizing Provider  cephALEXin (KEFLEX) 500 MG capsule Take 1 capsule (500 mg total) by mouth 3 (three) times daily for 7 days. 04/23/21 04/30/21 Yes Bero, Elmer Sow, MD  calcium carbonate (OSCAL) 1500 (600 Ca) MG TABS tablet Take 1,200 mg by mouth daily with breakfast.    [provider]  celecoxib (CELEBREX) 200 MG capsule Take 200 mg by mouth daily.    [provider]  Cholecalciferol 25 MCG (1000 UT) tablet Take 1,000 Units by mouth in the morning and at bedtime.    [provider]  estradiol (ESTRING) 2 MG vaginal ring Place 2 mg vaginally every 3 (three) months.    [provider]  FLUoxetine (PROZAC) 20 MG capsule Take 20 mg by mouth daily.    [provider]  mirabegron ER (MYRBETRIQ) 50 MG TB24 tablet Take 50 mg by mouth daily.    [provider]  traMADol (ULTRAM) 50 MG tablet Take 50 mg by mouth 2  (two) times daily as needed for pain. 07/25/19   [provider]  Turmeric 500 MG TABS Take 500 mg by mouth daily.    [provider]      Allergies    Bacitracin    Review of Systems   Review of Systems  All other systems reviewed and are negative.  Physical Exam Updated Vital Signs BP (!) 153/86    Pulse 70    Temp 98 F (36.7 C) (Oral)    Resp 16    Ht 5\' 7"  (1.702 m)    Wt 59 kg    SpO2 97%    BMI 20.36 kg/m  Physical Exam Vitals and nursing note reviewed.  Constitutional:      General: She is not in acute distress.    Appearance: She is well-developed.     Comments: GCS 15, ABC intact  HENT:     Head: Normocephalic.     Comments: 2 cm laceration to the left parietal scalp Eyes:     Extraocular Movements: Extraocular movements intact.     Conjunctiva/sclera: Conjunctivae normal.     Pupils: Pupils are equal, round, and reactive to light.  Neck:     Comments: No midline tenderness to palpation of the cervical spine.  Range of motion intact Cardiovascular:     Rate and Rhythm: Normal rate and regular rhythm.  Heart sounds: No murmur heard. Pulmonary:     Effort: Pulmonary effort is normal. No respiratory distress.     Breath sounds: Normal breath sounds.  Chest:     Comments: Clavicles stable nontender to AP compression.  Chest wall stable and nontender to AP and lateral compression. Abdominal:     Palpations: Abdomen is soft.     Tenderness: There is no abdominal tenderness.  Musculoskeletal:     Cervical back: Neck supple.     Comments: No midline tenderness to palpation of the thoracic or lumbar spine.  Extremities atraumatic with intact range of motion, mild tenderness of the right shoulder  Skin:    General: Skin is warm and dry.     Comments: Skin tear on left elbow  Neurological:     Mental Status: She is alert.     Comments: Cranial nerves II through XII grossly intact.  Moving all 4 extremities spontaneously.  Sensation grossly intact  all 4 extremities    ED Results / Procedures / Treatments   Labs (all labs ordered are listed, but only abnormal results are displayed) Labs Reviewed  COMPREHENSIVE METABOLIC PANEL - Abnormal; Notable for the following components:      Result Value   Glucose, Bld 130 (*)    Total Protein 6.3 (*)    All other components within normal limits  URINALYSIS, ROUTINE W REFLEX MICROSCOPIC - Abnormal; Notable for the following components:   APPearance HAZY (*)    Hgb urine dipstick TRACE (*)    Nitrite POSITIVE (*)    Leukocytes,Ua LARGE (*)    WBC, UA >50 (*)    Bacteria, UA FEW (*)    All other components within normal limits  RESP PANEL BY RT-PCR (FLU A&B, COVID) ARPGX2  URINE CULTURE  CBC  ETHANOL  PROTIME-INR    EKG None  Radiology DG Elbow Complete Left  Result Date: 04/22/2021 CLINICAL DATA:  Fall. EXAM: LEFT ELBOW - COMPLETE 3+ VIEW COMPARISON:  None. FINDINGS: There is no evidence of fracture, dislocation, or joint effusion. There is no evidence of arthropathy or other focal bone abnormality. Soft tissues are unremarkable. IMPRESSION: Negative. Electronically Signed   By: Darliss CheneyAmy  Guttmann M.D.   On: 04/22/2021 22:31   CT HEAD WO CONTRAST  Result Date: 04/22/2021 CLINICAL DATA:  Neck trauma.  Fall. EXAM: CT HEAD WITHOUT CONTRAST CT CERVICAL SPINE WITHOUT CONTRAST TECHNIQUE: Multidetector CT imaging of the head and cervical spine was performed following the standard protocol without intravenous contrast. Multiplanar CT image reconstructions of the cervical spine were also generated. RADIATION DOSE REDUCTION: This exam was performed according to the departmental dose-optimization program which includes automated exposure control, adjustment of the mA and/or kV according to patient size and/or use of iterative reconstruction technique. COMPARISON:  CT head and cervical spine 02/08/2021. FINDINGS: CT HEAD FINDINGS Brain: No evidence of acute infarction, hemorrhage, hydrocephalus,  extra-axial collection or mass lesion/mass effect. There is stable mild diffuse atrophy and mild periventricular white matter hypodensity, likely chronic small vessel ischemic change. Vascular: No hyperdense vessel or unexpected calcification. Skull: Normal. Negative for fracture or focal lesion. Sinuses/Orbits: No acute finding. Other: None. CT CERVICAL SPINE FINDINGS Alignment: Normal. There is reversal of normal cervical lordosis which can be seen with muscle spasm. Skull base and vertebrae: No acute fracture. No primary bone lesion or focal pathologic process. The bones are osteopenic. Soft tissues and spinal canal: No prevertebral fluid or swelling. No visible canal hematoma. Disc levels: There is moderate disc space  narrowing and endplate osteophyte formation at C5-C6, C6-C7 and C7-T1 compatible with degenerative change similar to the prior study. No significant central canal or neural foraminal stenosis at any level. Upper chest: Negative. Other: None. IMPRESSION: No acute intracranial process. No acute fracture or traumatic subluxation of the cervical spine. Electronically Signed   By: Darliss Cheney M.D.   On: 04/22/2021 22:18   CT CERVICAL SPINE WO CONTRAST  Result Date: 04/22/2021 CLINICAL DATA:  Neck trauma.  Fall. EXAM: CT HEAD WITHOUT CONTRAST CT CERVICAL SPINE WITHOUT CONTRAST TECHNIQUE: Multidetector CT imaging of the head and cervical spine was performed following the standard protocol without intravenous contrast. Multiplanar CT image reconstructions of the cervical spine were also generated. RADIATION DOSE REDUCTION: This exam was performed according to the departmental dose-optimization program which includes automated exposure control, adjustment of the mA and/or kV according to patient size and/or use of iterative reconstruction technique. COMPARISON:  CT head and cervical spine 02/08/2021. FINDINGS: CT HEAD FINDINGS Brain: No evidence of acute infarction, hemorrhage, hydrocephalus,  extra-axial collection or mass lesion/mass effect. There is stable mild diffuse atrophy and mild periventricular white matter hypodensity, likely chronic small vessel ischemic change. Vascular: No hyperdense vessel or unexpected calcification. Skull: Normal. Negative for fracture or focal lesion. Sinuses/Orbits: No acute finding. Other: None. CT CERVICAL SPINE FINDINGS Alignment: Normal. There is reversal of normal cervical lordosis which can be seen with muscle spasm. Skull base and vertebrae: No acute fracture. No primary bone lesion or focal pathologic process. The bones are osteopenic. Soft tissues and spinal canal: No prevertebral fluid or swelling. No visible canal hematoma. Disc levels: There is moderate disc space narrowing and endplate osteophyte formation at C5-C6, C6-C7 and C7-T1 compatible with degenerative change similar to the prior study. No significant central canal or neural foraminal stenosis at any level. Upper chest: Negative. Other: None. IMPRESSION: No acute intracranial process. No acute fracture or traumatic subluxation of the cervical spine. Electronically Signed   By: Darliss Cheney M.D.   On: 04/22/2021 22:18   DG Chest Port 1 View  Result Date: 04/22/2021 CLINICAL DATA:  Fall EXAM: PORTABLE CHEST 1 VIEW COMPARISON:  None. FINDINGS: Increased interstitial markings. No focal consolidation. No pleural effusion or pneumothorax. The heart is normal in size. IMPRESSION: No evidence of acute cardiopulmonary disease. Electronically Signed   By: Charline Bills M.D.   On: 04/22/2021 22:29   DG Shoulder Right Port  Result Date: 04/22/2021 CLINICAL DATA:  Fall. EXAM: RIGHT SHOULDER - 1 VIEW COMPARISON:  None. FINDINGS: There is no evidence of fracture or dislocation. The bones are osteopenic. There are mild degenerative changes of the acromioclavicular joint and glenohumeral joint with joint space narrowing. Soft tissues are unremarkable. IMPRESSION: 1. No acute fracture or dislocation of  the right shoulder. Electronically Signed   By: Darliss Cheney M.D.   On: 04/22/2021 22:31    Procedures .Marland KitchenLaceration Repair  Date/Time: 04/22/2021 11:45 PM Performed by: Ernie Avena, MD Authorized by: Ernie Avena, MD   Consent:    Consent obtained:  Verbal   Consent given by:  Patient   Risks discussed:  Infection and pain   Alternatives discussed:  No treatment Universal protocol:    Patient identity confirmed:  Verbally with patient Anesthesia:    Anesthesia method:  Local infiltration   Local anesthetic:  Lidocaine 1% WITH epi Laceration details:    Location:  Scalp   Scalp location:  L parietal   Length (cm):  2   Depth (mm):  3  Treatment:    Area cleansed with:  Saline   Amount of cleaning:  Standard Skin repair:    Repair method:  Staples   Number of staples:  4 Approximation:    Approximation:  Close Repair type:    Repair type:  Simple Post-procedure details:    Procedure completion:  Tolerated    Medications Ordered in ED Medications  lidocaine-EPINEPHrine (XYLOCAINE W/EPI) 2 %-1:200000 (PF) injection 10 mL (10 mLs Infiltration Given 04/22/21 2236)    ED Course/ Medical Decision Making/ A&P                           Medical Decision Making Amount and/or Complexity of Data Reviewed Labs: ordered. Radiology: ordered.  Risk Prescription drug management.   88 year old female with medical history significant for vitamin D deficiency who presents to the emergency department after a fall.  The patient states that she occasionally will lose her balance and fall at home.  She denies a history of stroke.  She denies any acute neurologic deficits.  She states that she lost her balance and struck her head on the commode earlier today.  She denied any loss of consciousness.  She endorses some pain in her right shoulder.  She sustained a skin tear to her left elbow.  She is not sure of her last tetanus status but thinks it was within the past few years.  On chart  review, the patient's tetanus was updated in 2021.  [facility] GCS [facility], ABC intact.  Marie EvensBridget Matich is a 88 y.o. female who presents by EMS as an unleveled trauma patient after a fall as per above.  Currently, she is awake, alert, and protecting her own airway and is hemodynamically stable.  Trauma imaging revealed (full reports in EMR): CT head and cervical spine: No acute traumatic abnormalities.  XR Right shoulder and left elbow: Negative CXR: Negative  Labs: Unremarkable, urinalysis pending, to be obtained from a the patient's indwelling foley.   There were no significant lab abnormalities.  The patient's scalp laceration was repairs with staples as per the procedure note above. Wound care instructions were provided.  Plan to follow-up the patient's urine studies as her daughter had reported intermittent confusion recently.Signout given to Dr. Pilar PlateBero at 0000.      Final Clinical Impression(s) / ED Diagnoses Final diagnoses:  Trauma  Fall, initial encounter  Laceration of scalp, initial encounter    Rx / DC Orders ED Discharge Orders          Ordered    cephALEXin (KEFLEX) 500 MG capsule  3 times daily        04/23/21 0026              Ernie AvenaLawsing, Bert Ptacek, MD 04/23/21 1406

## 2021-04-22 NOTE — Discharge Instructions (Addendum)
You were evaluated in the Emergency Department and after careful evaluation, we did not find any emergent condition requiring admission or further testing in the hospital.  Your exam/testing today was overall reassuring.  No evidence of broken bones or emergencies.  Your urine sample may be infected.  Recommend taking the Keflex as directed and following up closely with your regular doctor.  Please return to the Emergency Department if you experience any worsening of your condition.  Thank you for allowing [place] to be a part of your care.

## 2021-04-22 NOTE — ED Triage Notes (Signed)
Pt was in bathroom at home and lost balance hitting left sid eof head on bathtub, denies any loc, also has small scrape to right shoulder and left elbow. Laceration to left side of head- bleeding controlled. Pt complains of should pain. Per daughter pt has dementia at baseline but seems more confused to her. Pt is alert and oriented during triage.

## 2021-04-22 NOTE — ED Notes (Signed)
Patient transported to CT 

## 2021-04-22 NOTE — ED Provider Notes (Signed)
°  Provider Note MRN:  [id number]  Arrival date & time: 04/22/21    ED Course and Medical Decision Making  Assumed care from Dr. Armandina Gemma at shift change.  Ground-level fall today, also endorsing some intermittent confusion.  Recently stopped antibiotics for a UTI.  Currently with a normal neurological exam and mental status.  Normal vital signs.  Work-up thus far is reassuring, no signs of traumatic injuries.  Awaiting urinalysis, anticipating discharge possibly with antibiotics.  Procedures  Final Clinical Impressions(s) / ED Diagnoses     ICD-10-CM   1. Fall, initial encounter  W19.XXXA     2. Trauma  T14.90XA DG Chest [facility] 1 View    DG Shoulder Right Port    DG Chest Port 1 View    DG Shoulder Right Port    3. Laceration of scalp, initial encounter  [email or web address]       ED Discharge Orders     None         Discharge Instructions      You were evaluated in the Emergency Department and after careful evaluation, we did not find any emergent condition requiring admission or further testing in the hospital.  Your exam/testing today was overall reassuring.  Please return to the Emergency Department if you experience any worsening of your condition.  Thank you for allowing [place] to be a part of your care.     Barth Kirks. Sedonia Small, [place] [email or web address]    Maudie Flakes, MD 04/23/21 [phone]

## 2021-04-23 LAB — URINALYSIS, ROUTINE W REFLEX MICROSCOPIC
Bilirubin Urine: NEGATIVE
Glucose, UA: NEGATIVE mg/dL
Ketones, ur: NEGATIVE mg/dL
Nitrite: POSITIVE — AB
Protein, ur: NEGATIVE mg/dL
Specific Gravity, Urine: 1.012 (ref 1.005–1.030)
WBC, UA: >50 WBC/hpf — ABNORMAL HIGH (ref 0–5)
pH: 6.5 (ref 5.0–8.0)

## 2021-04-23 MED ORDER — CEPHALEXIN 500 MG PO CAPS: 500.0000 mg | ORAL_CAPSULE | Freq: Three times a day (TID) | ORAL | 0 refills | Status: AC

## 2021-04-23 NOTE — ED Notes (Signed)
Dressed skin tear with telfa and curlix

## 2021-04-23 NOTE — ED Notes (Signed)
Pt and daughter verbalize understanding of discharge instructions. Opportunity for questioning and answers were provided. Pt discharged from ED to home.

## 2021-04-25 LAB — URINE CULTURE: Culture: >=100000 — AB

## 2021-04-26 ENCOUNTER — Telehealth (HOSPITAL_BASED_OUTPATIENT_CLINIC_OR_DEPARTMENT_OTHER): Payer: Self-pay | Admitting: Emergency Medicine

## 2021-04-26 NOTE — Telephone Encounter (Signed)
Post ED Visit - Positive Culture Follow-up  Culture report reviewed by antimicrobial stewardship pharmacist: [facility] []  [address] Pharm.D. []  [place], Pharm.D., BCPS AQ-ID []  , Pharm.D., BCPS []  Celedonio Miyamoto, Pharm.D., BCPS []  [place], Garvin Fila.D., BCPS, AAHIVP []  , Pharm.D., BCPS, AAHIVP []  Georgina Pillion, PharmD, BCPS []  , PharmD, BCPS []  [place], PharmD, BCPS [x]  [address], PharmD []  , PharmD, BCPS []  Estella Husk, PharmD  Pharmacy Team []  Lysle Pearl, PharmD []  , PharmD []  Phillips Climes, PharmD []  , Rph []  Agapito Games) , PharmD []  Daylene Posey, PharmD []  , PharmD []  Mervyn Gay, PharmD []  , PharmD []  Vinnie Level, PharmD []  [facility], PharmD []  , PharmD []  Len Childs, PharmD   Positive urine culture Treated with Cephalexin, organism sensitive to the same and no further patient follow-up is required at this time.  04/26/2021, 9:33 AM

## 2021-05-09 ENCOUNTER — Ambulatory Visit: Payer: Self-pay | Admitting: *Deleted

## 2021-05-09 NOTE — Telephone Encounter (Signed)
Pt's daughter calling hoping to initiate placement for pt. States "Near miss fall" today. Pt requiring max assist in home. Advised PCP best resource, advised to speak with Case manager or SW at practice. Pt has PCP. States will do so.

## 2021-08-14 ENCOUNTER — Emergency Department (HOSPITAL_COMMUNITY): Payer: Medicare Other

## 2021-08-14 ENCOUNTER — Other Ambulatory Visit: Payer: Self-pay

## 2021-08-14 ENCOUNTER — Inpatient Hospital Stay (HOSPITAL_COMMUNITY)
Admission: EM | Admit: 2021-08-14 | Discharge: 2021-08-17 | DRG: 065 | Disposition: A | Discharge status: Rehabilitation Facility | Payer: Medicare Other | Source: Skilled Nursing Facility | Attending: Family Medicine | Admitting: Family Medicine

## 2021-08-14 ENCOUNTER — Encounter (HOSPITAL_COMMUNITY): Payer: Self-pay

## 2021-08-14 DIAGNOSIS — R531 Weakness: Secondary | ICD-10-CM

## 2021-08-14 DIAGNOSIS — Z79899 Other long term (current) drug therapy: Secondary | ICD-10-CM

## 2021-08-14 DIAGNOSIS — I639 Cerebral infarction, unspecified: Secondary | ICD-10-CM | POA: Diagnosis present

## 2021-08-14 DIAGNOSIS — Y92129 Unspecified place in nursing home as the place of occurrence of the external cause: Secondary | ICD-10-CM

## 2021-08-14 DIAGNOSIS — Z881 Allergy status to other antibiotic agents status: Secondary | ICD-10-CM

## 2021-08-14 DIAGNOSIS — F419 Anxiety disorder, unspecified: Secondary | ICD-10-CM

## 2021-08-14 DIAGNOSIS — W19XXXA Unspecified fall, initial encounter: Secondary | ICD-10-CM

## 2021-08-14 DIAGNOSIS — K5900 Constipation, unspecified: Secondary | ICD-10-CM | POA: Diagnosis present

## 2021-08-14 DIAGNOSIS — W1830XA Fall on same level, unspecified, initial encounter: Secondary | ICD-10-CM | POA: Diagnosis present

## 2021-08-14 DIAGNOSIS — R4701 Aphasia: Secondary | ICD-10-CM | POA: Diagnosis present

## 2021-08-14 DIAGNOSIS — I63512 Cerebral infarction due to unspecified occlusion or stenosis of left middle cerebral artery: Secondary | ICD-10-CM | POA: Diagnosis not present

## 2021-08-14 DIAGNOSIS — N3281 Overactive bladder: Secondary | ICD-10-CM | POA: Diagnosis present

## 2021-08-14 DIAGNOSIS — I10 Essential (primary) hypertension: Secondary | ICD-10-CM

## 2021-08-14 DIAGNOSIS — E785 Hyperlipidemia, unspecified: Secondary | ICD-10-CM | POA: Diagnosis present

## 2021-08-14 DIAGNOSIS — R54 Age-related physical debility: Secondary | ICD-10-CM | POA: Diagnosis present

## 2021-08-14 DIAGNOSIS — F05 Delirium due to known physiological condition: Secondary | ICD-10-CM | POA: Diagnosis present

## 2021-08-14 DIAGNOSIS — M8008XA Age-related osteoporosis with current pathological fracture, vertebra(e), initial encounter for fracture: Secondary | ICD-10-CM | POA: Diagnosis present

## 2021-08-14 DIAGNOSIS — R4789 Other speech disturbances: Secondary | ICD-10-CM

## 2021-08-14 DIAGNOSIS — G8191 Hemiplegia, unspecified affecting right dominant side: Secondary | ICD-10-CM | POA: Diagnosis present

## 2021-08-14 DIAGNOSIS — M810 Age-related osteoporosis without current pathological fracture: Secondary | ICD-10-CM

## 2021-08-14 DIAGNOSIS — S3210XA Unspecified fracture of sacrum, initial encounter for closed fracture: Principal | ICD-10-CM

## 2021-08-14 DIAGNOSIS — R296 Repeated falls: Secondary | ICD-10-CM

## 2021-08-14 DIAGNOSIS — R29705 NIHSS score 5: Secondary | ICD-10-CM | POA: Diagnosis present

## 2021-08-14 LAB — CBC WITH DIFFERENTIAL/PLATELET
Abs Immature Granulocytes: 0.04 10*3/uL (ref 0.00–0.07)
Basophils Absolute: 0 10*3/uL (ref 0.0–0.1)
Basophils Relative: 0 %
Eosinophils Absolute: 0.2 10*3/uL (ref 0.0–0.5)
Eosinophils Relative: 3 %
HCT: 41.3 % (ref 36.0–46.0)
Hemoglobin: 13 g/dL (ref 12.0–15.0)
Immature Granulocytes: 1 %
Lymphocytes Relative: 7 %
Lymphs Abs: 0.5 10*3/uL — ABNORMAL LOW (ref 0.7–4.0)
MCH: 30.2 pg (ref 26.0–34.0)
MCHC: 31.5 g/dL (ref 30.0–36.0)
MCV: 96 fL (ref 80.0–100.0)
Monocytes Absolute: 0.5 10*3/uL (ref 0.1–1.0)
Monocytes Relative: 6 %
Neutro Abs: 6.2 10*3/uL (ref 1.7–7.7)
Neutrophils Relative %: 83 %
Platelets: 226 10*3/uL (ref 150–400)
RBC: 4.3 MIL/uL (ref 3.87–5.11)
RDW: 14.1 % (ref 11.5–15.5)
WBC: 7.5 10*3/uL (ref 4.0–10.5)
nRBC: 0 % (ref 0.0–0.2)

## 2021-08-14 LAB — COMPREHENSIVE METABOLIC PANEL
ALT: 25 U/L (ref 0–44)
AST: 30 U/L (ref 15–41)
Albumin: 3.6 g/dL (ref 3.5–5.0)
Alkaline Phosphatase: 53 U/L (ref 38–126)
Anion gap: 5 (ref 5–15)
BUN: 20 mg/dL (ref 8–23)
CO2: 26 mmol/L (ref 22–32)
Calcium: 8.7 mg/dL — ABNORMAL LOW (ref 8.9–10.3)
Chloride: 107 mmol/L (ref 98–111)
Creatinine, Ser: 1.04 mg/dL — ABNORMAL HIGH (ref 0.44–1.00)
GFR, Estimated: 52 mL/min — ABNORMAL LOW (ref >60)
Glucose, Bld: 116 mg/dL — ABNORMAL HIGH (ref 70–99)
Potassium: 3.6 mmol/L (ref 3.5–5.1)
Sodium: 138 mmol/L (ref 135–145)
Total Bilirubin: 0.4 mg/dL (ref 0.3–1.2)
Total Protein: 6.5 g/dL (ref 6.5–8.1)

## 2021-08-14 LAB — URINALYSIS, ROUTINE W REFLEX MICROSCOPIC
Bilirubin Urine: NEGATIVE
Glucose, UA: NEGATIVE mg/dL
Hgb urine dipstick: NEGATIVE
Ketones, ur: NEGATIVE mg/dL
Leukocytes,Ua: NEGATIVE
Nitrite: NEGATIVE
Protein, ur: NEGATIVE mg/dL
Specific Gravity, Urine: 1.006 (ref 1.005–1.030)
pH: 8 (ref 5.0–8.0)

## 2021-08-14 MED ORDER — SODIUM CHLORIDE 0.9 % IV BOLUS
500.0000 mL | Freq: Once | INTRAVENOUS | Status: AC
  Administered 2021-08-14: 500 mL via INTRAVENOUS

## 2021-08-14 NOTE — ED Triage Notes (Signed)
Pt arrived via EMS from [facility] reporting a fall. Pt fell in the kitchen while making a sandwich denies hitting her head, denies LOC, denies blood thinner use. Pt states she has been feeling weak. Pt c/o pain to her sacrum.

## 2021-08-14 NOTE — ED Provider Notes (Signed)
[facility]EMERGENCY [facility]PT Provider Note   CSN: [id number] Arrival date & time: 08/14/21  2002     History  Chief Complaint  Patient presents with   Marie Mitchell is a 88 y.o. female.  HPI Patient is a 88 year old female with a history of frequent falls, recent T12 compression fracture requiring T8, T12, L1 spine kyphoplasty on May 10, 2021 with neurosurgeon Dr. Manson Passey.  She presents to the emergency department today from her skilled nursing facility due to a fall.  Per EMS, patient was feeling weak while making a sandwich and fell to the floor.  Patient reports pain to the left hip.  Denies any head trauma or any other regions of pain.  I spoke to the patient's daughter Dewayne Hatch. Patient currently appears confused.  She is able to tell me who the president is but otherwise cannot tell me the year or her location.  Her daughter states that at times she will become confused, particularly in the evening.  She states that is not normal for her to feel weak or weakness.  She does have a chronic Foley catheter in place.  Also notes that she has had less p.o. intake over the past 24 hours.  Level 5 caveat due to confusion.     Home Medications Prior to Admission medications   Medication Sig Start Date End Date Taking? Authorizing Provider  calcium carbonate (OSCAL) 1500 (600 Ca) MG TABS tablet Take 1,200 mg by mouth daily with breakfast.    [provider]  celecoxib (CELEBREX) 200 MG capsule Take 200 mg by mouth daily.    [provider]  Cholecalciferol 25 MCG (1000 UT) tablet Take 1,000 Units by mouth in the morning and at bedtime.    [provider]  estradiol (ESTRING) 2 MG vaginal ring Place 2 mg vaginally every 3 (three) months.    [provider]  FLUoxetine (PROZAC) 20 MG capsule Take 20 mg by mouth daily.    [provider]  mirabegron ER (MYRBETRIQ) 50 MG TB24 tablet Take 50 mg by mouth daily.     [provider]  traMADol (ULTRAM) 50 MG tablet Take 50 mg by mouth 2 (two) times daily as needed for pain. 07/25/19   [provider]  Turmeric 500 MG TABS Take 500 mg by mouth daily.    [provider]      Allergies    Bacitracin    Review of Systems   Review of Systems  Unable to perform ROS: Other   Physical Exam Updated Vital Signs BP (!) 162/104   Pulse 97   Temp 97.8 F (36.6 C) (Oral)   Resp 16   SpO2 99%  Physical Exam Vitals and nursing note reviewed.  Constitutional:      General: She is not in acute distress.    Appearance: Normal appearance. She is not ill-appearing, toxic-appearing or diaphoretic.  HENT:     Head: Normocephalic and atraumatic.     Right Ear: External ear normal.     Left Ear: External ear normal.     Nose: Nose normal.     Mouth/Throat:     Mouth: Mucous membranes are moist.     Pharynx: Oropharynx is clear. No oropharyngeal exudate or posterior oropharyngeal erythema.  Eyes:     Extraocular Movements: Extraocular movements intact.  Cardiovascular:     Rate and Rhythm: Normal rate and regular rhythm.     Pulses: Normal pulses.  Heart sounds: Normal heart sounds. No murmur heard.   No friction rub. No gallop.     Comments: Regular rate and rhythm without murmurs, rubs, or gallops. Pulmonary:     Effort: Pulmonary effort is normal. No respiratory distress.     Breath sounds: Normal breath sounds. No stridor. No wheezing, rhonchi or rales.     Comments: Lungs are clear to auscultation bilaterally. Abdominal:     General: Abdomen is flat.     Palpations: Abdomen is soft.     Tenderness: There is no abdominal tenderness.     Comments: Abdomen is soft and nontender.  Musculoskeletal:        General: Tenderness present. Normal range of motion.     Cervical back: Normal range of motion and neck supple. No tenderness.     Right lower leg: No edema.     Left lower leg: No edema.     Comments: Mild tenderness  noted to the left posterior hip.  No midline C, T, or L-spine tenderness.  No step-offs, crepitus, or deformities.  Full range of motion of all the joints of the arms and legs with no signs of tenderness.  Skin:    General: Skin is warm and dry.  Neurological:     General: No focal deficit present.     Mental Status: She is alert.     Comments: Answering questions and following commands.  Moving all 4 extremities with ease.  No gross deficits.  Patient can tell me her name and the president but unsure of the year or her location.  Psychiatric:        Mitchell and Affect: Mitchell normal.        Behavior: Behavior normal.   ED Results / Procedures / Treatments   Labs (all labs ordered are listed, but only abnormal results are displayed) Labs Reviewed  COMPREHENSIVE METABOLIC PANEL - Abnormal; Notable for the following components:      Result Value   Glucose, Bld 116 (*)    Creatinine, Ser 1.04 (*)    Calcium 8.7 (*)    GFR, Estimated 52 (*)    All other components within normal limits  CBC WITH DIFFERENTIAL/PLATELET - Abnormal; Notable for the following components:   Lymphs Abs 0.5 (*)    All other components within normal limits  URINALYSIS, ROUTINE W REFLEX MICROSCOPIC - Abnormal; Notable for the following components:   Color, Urine COLORLESS (*)    All other components within normal limits    EKG None  Radiology CT HEAD WO CONTRAST ( )  Result Date: 08/14/2021 CLINICAL DATA:  Head trauma due to a fall. No loss of consciousness. No blood thinner use. EXAM: CT HEAD WITHOUT CONTRAST TECHNIQUE: Contiguous axial images were obtained from the base of the skull through the vertex without intravenous contrast. RADIATION DOSE REDUCTION: This exam was performed according to the departmental dose-optimization program which includes automated exposure control, adjustment of the mA and/or kV according to patient size and/or use of iterative reconstruction technique. COMPARISON:  04/22/2021  FINDINGS: Brain: Diffuse cerebral atrophy. Ventricular dilatation consistent with central atrophy. Low-attenuation changes in the deep white matter consistent with small vessel ischemia. No abnormal extra-axial fluid collections. No mass effect or midline shift. Gray-white matter junctions are distinct. Basal cisterns are not effaced. No acute intracranial hemorrhage. Vascular: Intracranial arterial vascular calcifications. Skull: Calvarium appears intact. Small subcutaneous scalp hematoma over the anterior frontal region. Sinuses/Orbits: Paranasal sinuses and mastoid air cells are clear. Other: None. IMPRESSION: No  acute intracranial abnormalities. Chronic atrophy and small vessel ischemic changes. Electronically Signed   By: Burman Nieves M.D.   On: 08/14/2021 20:50   CT Cervical Spine Wo Contrast  Result Date: 08/14/2021 CLINICAL DATA:  Acute neck pain after a fall. EXAM: CT CERVICAL SPINE WITHOUT CONTRAST TECHNIQUE: Multidetector CT imaging of the cervical spine was performed without intravenous contrast. Multiplanar CT image reconstructions were also generated. RADIATION DOSE REDUCTION: This exam was performed according to the departmental dose-optimization program which includes automated exposure control, adjustment of the mA and/or kV according to patient size and/or use of iterative reconstruction technique. COMPARISON:  04/22/2021 FINDINGS: Alignment: Straightening of usual cervical lordosis without anterior subluxation. This may be due to patient positioning but could indicate muscle spasm. Normal alignment of the posterior elements. C1-2 articulation appears intact. Skull base and vertebrae: No acute fracture. No primary bone lesion or focal pathologic process. Soft tissues and spinal canal: No prevertebral fluid or swelling. No visible canal hematoma. Disc levels: Diffuse degenerative change with intervertebral disc space narrowing and endplate osteophyte formation throughout. Degenerative  changes in the facet joints. Upper chest: Mild scarring and calcification in the lung apices, likely postinflammatory. Other: None. IMPRESSION: Nonspecific straightening of usual cervical lordosis. Diffuse degenerative change. No acute displaced fractures are identified. Electronically Signed   By: Burman Nieves M.D.   On: 08/14/2021 20:52   DG Hip Unilat W or Wo Pelvis 2-3 Views Left  Result Date: 08/14/2021 CLINICAL DATA:  Fall, left hip pain EXAM: DG HIP (WITH OR WITHOUT PELVIS) 2-3V LEFT COMPARISON:  None Available. FINDINGS: Normal alignment. No acute fracture or dislocation. Mild left hip degenerative arthritis. Lumbar fusion hardware partially visualized. Foley catheter overlies the expected bladder lumen. Soft tissues are otherwise unremarkable. IMPRESSION: No acute abnormality Electronically Signed   By: Helyn Numbers M.D.   On: 08/14/2021 21:06    Procedures Procedures   Medications Ordered in ED Medications  sodium chloride 0.9 % bolus 500 mL (500 mLs Intravenous New Bag/Given 08/14/21 2218)   ED Course/ Medical Decision Making/ A&P Clinical Course as of 08/14/21 2338  Wed Aug 14, 2021  2146 Nursing staff notified me that patient accidentally removed her Foley catheter.  We will place an order for a new Foley. [LJ]    Clinical Course User Index [LJ] Placido Sou, PA-C                           Medical Decision Making Amount and/or Complexity of Data Reviewed Labs: ordered. Radiology: ordered.  Pt is a 88 y.o. female who presents to the emergency department due to weakness as well as a fall that occurred prior to arrival.  Labs: CBC with lymphocytes of 0.5. CMP with a glucose of 116, creatinine of 1.04, calcium of 8.7, GFR 52. UA is within normal limits.  Imaging: X-ray the left hip and pelvis shows no acute abnormality.  CT scan of the cervical spine without contrast shows nonspecific straightening of usual cervical lordosis.  Diffuse degenerative change.  No  acute displaced fractures are identified.  CT scan of the head without contrast shows no acute intracranial abnormalities.  Chronic atrophy and small vessel ischemic changes  I, Placido Sou, PA-C, personally reviewed and evaluated these images and lab results as part of my medical decision-making.  On my exam patient is disoriented.  She knows her name and the president but otherwise is unsure of where she is at or the year.  Moving  all 4 extremities.  Following commands.  She was complaining of pain to the left hip and has some mild tenderness along the left posterior hip but has good range of motion in the left hip.  Neurovascularly intact in the bilateral lower extremities.  CT scan of the head, cervical spine as well as x-rays of the left hip and pelvis appeared reassuring.  Patient's daughter at bedside states that she has had poor p.o. intake recently.  Creatinine is mildly elevated at 1.04 with a GFR 52.  Possible mild AKI.  She was given 500 cc of normal saline.  UA does not appear infectious.  CBC without leukocytosis.  After IV fluids nursing staff attempted to ambulate the patient but she had a difficult time standing upright and nearly fell.  Does not appear to be able to ambulate safely at this time.  Patient discussed with and evaluated by my attending physician Dr. Bethann BerkshireJoseph Zammit who recommends additional CT scans of the lumbar spine as well as left hip.  These have been ordered.  [facility]  Patient pending remaining CT scans.  Patient will require possible admission.  If not admitted, patient will likely need to board in the emergency department pending PT/OT evaluation.  Note: Portions of this report may have been transcribed using voice recognition software. Every effort was made to ensure accuracy; however, inadvertent computerized transcription errors may be present.   Final Clinical Impression(s) / ED Diagnoses Final diagnoses:   Weakness  Fall, initial encounter   Rx / DC Orders ED Discharge Orders     None         Placido SouJoldersma, Caston Coop[place], PA-C 08/14/21 2340    Bethann BerkshireZammit, Joseph, MD 08/17/21 [phone]

## 2021-08-15 ENCOUNTER — Emergency Department (HOSPITAL_COMMUNITY): Payer: Medicare Other

## 2021-08-15 ENCOUNTER — Inpatient Hospital Stay (HOSPITAL_COMMUNITY): Payer: Medicare Other

## 2021-08-15 DIAGNOSIS — Y92129 Unspecified place in nursing home as the place of occurrence of the external cause: Secondary | ICD-10-CM | POA: Diagnosis not present

## 2021-08-15 DIAGNOSIS — R4701 Aphasia: Secondary | ICD-10-CM | POA: Diagnosis present

## 2021-08-15 DIAGNOSIS — R296 Repeated falls: Secondary | ICD-10-CM | POA: Diagnosis present

## 2021-08-15 DIAGNOSIS — I69322 Dysarthria following cerebral infarction: Secondary | ICD-10-CM | POA: Diagnosis not present

## 2021-08-15 DIAGNOSIS — F05 Delirium due to known physiological condition: Secondary | ICD-10-CM | POA: Diagnosis present

## 2021-08-15 DIAGNOSIS — F32A Depression, unspecified: Secondary | ICD-10-CM | POA: Diagnosis present

## 2021-08-15 DIAGNOSIS — R4789 Other speech disturbances: Secondary | ICD-10-CM | POA: Diagnosis present

## 2021-08-15 DIAGNOSIS — E785 Hyperlipidemia, unspecified: Secondary | ICD-10-CM | POA: Diagnosis present

## 2021-08-15 DIAGNOSIS — S3210XA Unspecified fracture of sacrum, initial encounter for closed fracture: Secondary | ICD-10-CM

## 2021-08-15 DIAGNOSIS — G4700 Insomnia, unspecified: Secondary | ICD-10-CM | POA: Diagnosis present

## 2021-08-15 DIAGNOSIS — Z79899 Other long term (current) drug therapy: Secondary | ICD-10-CM | POA: Diagnosis not present

## 2021-08-15 DIAGNOSIS — N3289 Other specified disorders of bladder: Secondary | ICD-10-CM | POA: Diagnosis present

## 2021-08-15 DIAGNOSIS — I639 Cerebral infarction, unspecified: Secondary | ICD-10-CM | POA: Diagnosis present

## 2021-08-15 DIAGNOSIS — F419 Anxiety disorder, unspecified: Secondary | ICD-10-CM

## 2021-08-15 DIAGNOSIS — I10 Essential (primary) hypertension: Secondary | ICD-10-CM | POA: Diagnosis not present

## 2021-08-15 DIAGNOSIS — W1830XA Fall on same level, unspecified, initial encounter: Secondary | ICD-10-CM | POA: Diagnosis present

## 2021-08-15 DIAGNOSIS — Z881 Allergy status to other antibiotic agents status: Secondary | ICD-10-CM | POA: Diagnosis not present

## 2021-08-15 DIAGNOSIS — M533 Sacrococcygeal disorders, not elsewhere classified: Secondary | ICD-10-CM | POA: Diagnosis not present

## 2021-08-15 DIAGNOSIS — R52 Pain, unspecified: Secondary | ICD-10-CM | POA: Diagnosis not present

## 2021-08-15 DIAGNOSIS — I6389 Other cerebral infarction: Secondary | ICD-10-CM | POA: Diagnosis not present

## 2021-08-15 DIAGNOSIS — N3281 Overactive bladder: Secondary | ICD-10-CM | POA: Diagnosis present

## 2021-08-15 DIAGNOSIS — E559 Vitamin D deficiency, unspecified: Secondary | ICD-10-CM | POA: Diagnosis present

## 2021-08-15 DIAGNOSIS — R29705 NIHSS score 5: Secondary | ICD-10-CM | POA: Diagnosis present

## 2021-08-15 DIAGNOSIS — R54 Age-related physical debility: Secondary | ICD-10-CM | POA: Diagnosis present

## 2021-08-15 DIAGNOSIS — I69351 Hemiplegia and hemiparesis following cerebral infarction affecting right dominant side: Secondary | ICD-10-CM | POA: Diagnosis not present

## 2021-08-15 DIAGNOSIS — R001 Bradycardia, unspecified: Secondary | ICD-10-CM | POA: Diagnosis not present

## 2021-08-15 DIAGNOSIS — I63512 Cerebral infarction due to unspecified occlusion or stenosis of left middle cerebral artery: Secondary | ICD-10-CM | POA: Diagnosis present

## 2021-08-15 DIAGNOSIS — I959 Hypotension, unspecified: Secondary | ICD-10-CM | POA: Diagnosis not present

## 2021-08-15 DIAGNOSIS — K5900 Constipation, unspecified: Secondary | ICD-10-CM | POA: Diagnosis not present

## 2021-08-15 DIAGNOSIS — G8191 Hemiplegia, unspecified affecting right dominant side: Secondary | ICD-10-CM | POA: Diagnosis present

## 2021-08-15 DIAGNOSIS — W1839XD Other fall on same level, subsequent encounter: Secondary | ICD-10-CM | POA: Diagnosis not present

## 2021-08-15 DIAGNOSIS — R7989 Other specified abnormal findings of blood chemistry: Secondary | ICD-10-CM | POA: Diagnosis not present

## 2021-08-15 DIAGNOSIS — M810 Age-related osteoporosis without current pathological fracture: Secondary | ICD-10-CM

## 2021-08-15 DIAGNOSIS — M8008XA Age-related osteoporosis with current pathological fracture, vertebra(e), initial encounter for fracture: Secondary | ICD-10-CM | POA: Diagnosis present

## 2021-08-15 DIAGNOSIS — S3219XD Other fracture of sacrum, subsequent encounter for fracture with routine healing: Secondary | ICD-10-CM | POA: Diagnosis not present

## 2021-08-15 DIAGNOSIS — R339 Retention of urine, unspecified: Secondary | ICD-10-CM | POA: Diagnosis present

## 2021-08-15 DIAGNOSIS — I69398 Other sequelae of cerebral infarction: Secondary | ICD-10-CM | POA: Diagnosis present

## 2021-08-15 DIAGNOSIS — K5909 Other constipation: Secondary | ICD-10-CM | POA: Diagnosis present

## 2021-08-15 LAB — HEMOGLOBIN A1C
Hgb A1c MFr Bld: 5.3 % (ref 4.8–5.6)
Mean Plasma Glucose: 105.41 mg/dL

## 2021-08-15 LAB — ECHOCARDIOGRAM COMPLETE
Area-P 1/2: 1.91 cm2
Calc EF: 59.3 %
S' Lateral: 3.1 cm
Single Plane A2C EF: 59.3 %
Single Plane A4C EF: 58.4 %

## 2021-08-15 MED ORDER — STROKE: EARLY STAGES OF RECOVERY BOOK
Freq: Once | Status: AC
  Filled 2021-08-15: qty 1

## 2021-08-15 MED ORDER — OYSTER SHELL CALCIUM/D3 500-5 MG-MCG PO TABS
2.0000 | ORAL_TABLET | Freq: Every day | ORAL | Status: DC
  Administered 2021-08-16 – 2021-08-17 (×2): 2 via ORAL
  Filled 2021-08-15 (×2): qty 2

## 2021-08-15 MED ORDER — CLOPIDOGREL BISULFATE 75 MG PO TABS
75.0000 mg | ORAL_TABLET | Freq: Every day | ORAL | Status: DC
  Administered 2021-08-16 – 2021-08-17 (×2): 75 mg via ORAL
  Filled 2021-08-15 (×3): qty 1

## 2021-08-15 MED ORDER — IOHEXOL 350 MG/ML SOLN
75.0000 mL | Freq: Once | INTRAVENOUS | Status: AC | PRN
  Administered 2021-08-15: 75 mL via INTRAVENOUS

## 2021-08-15 MED ORDER — ACETAMINOPHEN 500 MG PO TABS
1000.0000 mg | ORAL_TABLET | Freq: Once | ORAL | Status: AC
  Administered 2021-08-15: 500 mg via ORAL
  Filled 2021-08-15: qty 2

## 2021-08-15 MED ORDER — OYSTER SHELL CALCIUM/D3 500-5 MG-MCG PO TABS
1.0000 | ORAL_TABLET | Freq: Every day | ORAL | Status: DC
  Filled 2021-08-15: qty 1

## 2021-08-15 MED ORDER — CALCIUM CARBONATE 1500 (600 CA) MG PO TABS: 600.0000 mg | ORAL_TABLET | ORAL | Status: DC

## 2021-08-15 MED ORDER — ASPIRIN 300 MG RE SUPP: 300.0000 mg | Freq: Every day | RECTAL | Status: DC

## 2021-08-15 MED ORDER — FENTANYL CITRATE PF 50 MCG/ML IJ SOSY
50.0000 ug | PREFILLED_SYRINGE | Freq: Once | INTRAMUSCULAR | Status: AC
  Administered 2021-08-15: 50 ug via INTRAVENOUS
  Filled 2021-08-15: qty 1

## 2021-08-15 MED ORDER — ACETAMINOPHEN 160 MG/5ML PO SOLN: 650.0000 mg | ORAL | Status: DC | PRN

## 2021-08-15 MED ORDER — VITAMIN D3 25 MCG (1000 UNIT) PO TABS
1000.0000 [IU] | ORAL_TABLET | Freq: Every day | ORAL | Status: DC
  Administered 2021-08-16: 1000 [IU] via ORAL
  Filled 2021-08-15 (×4): qty 1

## 2021-08-15 MED ORDER — FLUOXETINE HCL 10 MG PO CAPS
30.0000 mg | ORAL_CAPSULE | Freq: Every day | ORAL | Status: DC
  Administered 2021-08-16 – 2021-08-17 (×2): 30 mg via ORAL
  Filled 2021-08-15: qty 3
  Filled 2021-08-15: qty 2
  Filled 2021-08-15: qty 3

## 2021-08-15 MED ORDER — SODIUM CHLORIDE 0.9 % IV SOLN: INTRAVENOUS | Status: AC

## 2021-08-15 MED ORDER — CHLORHEXIDINE GLUCONATE CLOTH 2 % EX PADS
6.0000 | MEDICATED_PAD | Freq: Every day | CUTANEOUS | Status: DC
  Administered 2021-08-16 – 2021-08-17 (×2): 6 via TOPICAL

## 2021-08-15 MED ORDER — ASPIRIN 325 MG PO TABS
325.0000 mg | ORAL_TABLET | Freq: Every day | ORAL | Status: DC
  Administered 2021-08-16: 325 mg via ORAL
  Filled 2021-08-15: qty 1

## 2021-08-15 MED ORDER — SODIUM CHLORIDE 0.9 % IV SOLN: Freq: Once | INTRAVENOUS | Status: AC

## 2021-08-15 MED ORDER — ACETAMINOPHEN 325 MG PO TABS
650.0000 mg | ORAL_TABLET | ORAL | Status: DC | PRN
  Administered 2021-08-17: 650 mg via ORAL
  Filled 2021-08-15: qty 2

## 2021-08-15 MED ORDER — ACETAMINOPHEN 650 MG RE SUPP: 650.0000 mg | RECTAL | Status: DC | PRN

## 2021-08-15 MED ORDER — ATORVASTATIN CALCIUM 40 MG PO TABS
40.0000 mg | ORAL_TABLET | Freq: Every day | ORAL | Status: DC
  Administered 2021-08-16 – 2021-08-17 (×2): 40 mg via ORAL
  Filled 2021-08-15 (×2): qty 1

## 2021-08-15 MED ORDER — SENNOSIDES-DOCUSATE SODIUM 8.6-50 MG PO TABS: 1.0000 | ORAL_TABLET | Freq: Every evening | ORAL | Status: DC | PRN

## 2021-08-15 NOTE — Progress Notes (Signed)
  Echocardiogram 2D Echocardiogram has been performed.  Augustine Radar 08/15/2021, 2:53 PM

## 2021-08-15 NOTE — H&P (Signed)
History and Physical    Patient: Marie Mitchell [id number] DOB: 12-28-1933 DOA: 08/14/2021 DOS: the patient was seen and examined on 08/15/2021 PCP: System, Provider Not In  Patient coming from: ALF/ILF  Chief Complaint:  Chief Complaint  Patient presents with   Fall   HPI: Marie Mitchell is a 88 y.o. female with medical history significant of osteoporosis, vitamin d deficiency, anxiety. Presenting with fall. History is from her daughter. 2 nights ago, she fell after closing her blinds in her room. Unclear if she had and head injury or LOC. She decided not to come to ED. Yesterday afternoon, she was becoming more confused and said it was difficult to see and write. She went to make dinner in her kitchen. Her legs felt weak and she fell. There was now head injury or LOC. EMS was alerted and she was brought to the ED. The daughter was with her last night in the ED and she noticed that she was slurring speech and choking on water.   Review of Systems: As mentioned in the history of present illness. All other systems reviewed and are negative.  PMHx Anxiety Overactive bladder  PSHx Kyphoplasty Knee arthroplasty Hysterectomy Vericose veinsurgery  Social History:  reports that she has never smoked. She has never used smokeless tobacco. She reports current alcohol use. No history on file for drug use.  Allergies  Allergen Reactions   Bacitracin     Not on MAR    No family history on file.  Prior to Admission medications   Medication Sig Start Date End Date Taking? Authorizing Provider  acetaminophen (TYLENOL) 500 MG tablet Take 500 mg by mouth every 6 (six) hours as needed (for pain).   Yes [provider]  calcium carbonate (OSCAL) 1500 (600 Ca) MG TABS tablet Take 600-1,200 mg by mouth See admin instructions. Give 2 tablets by mouth in the morning then give 1 tablet every evening   Yes [provider]  Cholecalciferol 25 MCG (1000 UT) tablet Take 1,000 Units  by mouth at bedtime.   Yes [provider]  FLUoxetine (PROZAC) 20 MG tablet Take 30 mg by mouth daily.   Yes [provider]  polyethylene glycol (MIRALAX / GLYCOLAX) 17 g packet Take 17 g by mouth daily as needed (for constipation).   Yes [provider]  Turmeric 500 MG TABS Take 500 mg by mouth 2 (two) times daily.   Yes [provider]  vitamin B-12 (CYANOCOBALAMIN) 1000 MCG tablet Take 1,000 mcg by mouth daily.   Yes [provider]    Physical Exam: Vitals:   08/15/21 0610 08/15/21 0722 08/15/21 0834 08/15/21 0945  BP:  (!) 147/93 (!) 158/129 (!) 156/113  Pulse: 91 94 (!) 106 97  Resp: 14 16 16 16   Temp:  98 F (36.7 C)    TempSrc:  Oral    SpO2: 95% 97% 96% 97%   General: 88 y.o. female resting in bed in NAD Eyes: PERRL, normal sclera ENMT: Nares patent w/o discharge, orophaynx clear, dentition normal, ears w/o discharge/lesions/ulcers Neck: Supple, trachea midline Cardiovascular: RRR, +S1, S2, no m/g/r, equal pulses throughout Respiratory: CTABL, no w/r/r, normal WOB GI: BS+, NDNT, no masses noted, no organomegaly noted MSK: No e/c/c Neuro: A&O x 3, no focal deficits but hard to completely assess d/t sacral pain, she is somewhat confused/having difficulty relaying her thoughts Psyc: flat affect, sluggish calm/cooperative  Data Reviewed:  Na+  138 Scr 1.04 WBC  7.5  MRI Brain 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 Slight edema without mass effect.  MRI Sacrum/SI 1. Acute nondisplaced fracture of the inferior aspect of the left sacral ala extending into the S3 vertebral body with associated marrow edema and adjacent soft tissue edema. 2. Mild muscle edema in the left piriformis muscle likely reflecting reactive edema secondary to the adjacent sacral fracture versus mild muscle strain.  Assessment and Plan: Acute CVA     - admit to inpt, tele @ [facility]     - neurology  consulted, appreciate assistance     - MRI as above     - check CTA H&N, echo     - lipid panel, A1c     - PT/OT/SLP     - ASA, plavix, statin     - swallow screen, if pass, can have diet     - permissive HTN     - some expressive aphasia noted on exam, otherwise, no focality with the caveat of limited BLE exam d/t pain  Fall Sacral fracture     - MRI sacrum as above     - pain control     - PT/OT  HTN     - allow for permissive HTN  Anxiety     - resume home regimen when confirmed  Osteoporosis     - continue home regimen when confirmed  Advance Care Planning:   Code Status: FULL  Consults: Neurology (Dr. Theda Sers)  Family Communication: w/ daughter by phone  Severity of Illness: The appropriate patient status for this patient is INPATIENT. Inpatient status is judged to be reasonable and necessary in order to provide the required intensity of service to ensure the patient's safety. The patient's presenting symptoms, physical exam findings, and initial radiographic and laboratory data in the context of their chronic comorbidities is felt to place them at high risk for further clinical deterioration. Furthermore, it is not anticipated that the patient will be medically stable for discharge from the hospital within 2 midnights of admission.   * I certify that at the point of admission it is my clinical judgment that the patient will require inpatient hospital care spanning beyond 2 midnights from the point of admission due to high intensity of service, high risk for further deterioration and high frequency of surveillance required.*  Author: Jonnie Finner, DO 08/15/2021 11:31 AM  For on call review [email or web address].

## 2021-08-15 NOTE — ED Provider Notes (Signed)
Assumed care at shift change.  See prior note for full H&P.  Briefly, 88 year old female who fell today and now unable to ambulate.  Initial imaging is reassuring.  Daughter reports history of compression fractures not visible on x-ray so CT of hip and lumbar spine ordered.  We will follow-up on images.  Results for orders placed or performed during the hospital encounter of 08/14/21  Comprehensive metabolic panel  Result Value Ref Range   Sodium 138 135 - 145 mmol/L   Potassium 3.6 3.5 - 5.1 mmol/L   Chloride 107 98 - 111 mmol/L   CO2 26 22 - 32 mmol/L   Glucose, Bld 116 (H) 70 - 99 mg/dL   BUN 20 8 - 23 mg/dL   Creatinine, Ser 2.59 (H) 0.44 - 1.00 mg/dL   Calcium 8.7 (L) 8.9 - 10.3 mg/dL   Total Protein 6.5 6.5 - 8.1 g/dL   Albumin 3.6 3.5 - 5.0 g/dL   AST 30 15 - 41 U/L   ALT 25 0 - 44 U/L   Alkaline Phosphatase 53 38 - 126 U/L   Total Bilirubin 0.4 0.3 - 1.2 mg/dL   GFR, Estimated 52 (L) >60 mL/min   Anion gap 5 5 - 15  CBC with Differential  Result Value Ref Range   WBC 7.5 4.0 - 10.5 K/uL   RBC 4.30 3.87 - 5.11 MIL/uL   Hemoglobin 13.0 12.0 - 15.0 g/dL   HCT 56.3 87.5 - 64.3 %   MCV 96.0 80.0 - 100.0 fL   MCH 30.2 26.0 - 34.0 pg   MCHC 31.5 30.0 - 36.0 g/dL   RDW 32.9 51.8 - 84.1 %   Platelets 226 150 - 400 K/uL   nRBC 0.0 0.0 - 0.2 %   Neutrophils Relative % 83 %   Neutro Abs 6.2 1.7 - 7.7 K/uL   Lymphocytes Relative 7 %   Lymphs Abs 0.5 (L) 0.7 - 4.0 K/uL   Monocytes Relative 6 %   Monocytes Absolute 0.5 0.1 - 1.0 K/uL   Eosinophils Relative 3 %   Eosinophils Absolute 0.2 0.0 - 0.5 K/uL   Basophils Relative 0 %   Basophils Absolute 0.0 0.0 - 0.1 K/uL   Immature Granulocytes 1 %   Abs Immature Granulocytes 0.04 0.00 - 0.07 K/uL  Urinalysis, Routine w reflex microscopic Urine, Catheterized  Result Value Ref Range   Color, Urine COLORLESS (A) YELLOW   APPearance CLEAR CLEAR   Specific Gravity, Urine 1.006 1.005 - 1.030   pH 8.0 5.0 - 8.0   Glucose, UA NEGATIVE  NEGATIVE mg/dL   Hgb urine dipstick NEGATIVE NEGATIVE   Bilirubin Urine NEGATIVE NEGATIVE   Ketones, ur NEGATIVE NEGATIVE mg/dL   Protein, ur NEGATIVE NEGATIVE mg/dL   Nitrite NEGATIVE NEGATIVE   Leukocytes,Ua NEGATIVE NEGATIVE   CT HEAD WO CONTRAST ( )  Result Date: 08/14/2021 CLINICAL DATA:  Head trauma due to a fall. No loss of consciousness. No blood thinner use. EXAM: CT HEAD WITHOUT CONTRAST TECHNIQUE: Contiguous axial images were obtained from the base of the skull through the vertex without intravenous contrast. RADIATION DOSE REDUCTION: This exam was performed according to the departmental dose-optimization program which includes automated exposure control, adjustment of the mA and/or kV according to patient size and/or use of iterative reconstruction technique. COMPARISON:  04/22/2021 FINDINGS: Brain: Diffuse cerebral atrophy. Ventricular dilatation consistent with central atrophy. Low-attenuation changes in the deep white matter consistent with small vessel ischemia. No abnormal extra-axial fluid collections. No mass effect or  midline shift. Gray-white matter junctions are distinct. Basal cisterns are not effaced. No acute intracranial hemorrhage. Vascular: Intracranial arterial vascular calcifications. Skull: Calvarium appears intact. Small subcutaneous scalp hematoma over the anterior frontal region. Sinuses/Orbits: Paranasal sinuses and mastoid air cells are clear. Other: None. IMPRESSION: No acute intracranial abnormalities. Chronic atrophy and small vessel ischemic changes. Electronically Signed   By: Burman NievesWilliam  Stevens M.D.   On: 08/14/2021 20:50   CT Cervical Spine Wo Contrast  Result Date: 08/14/2021 CLINICAL DATA:  Acute neck pain after a fall. EXAM: CT CERVICAL SPINE WITHOUT CONTRAST TECHNIQUE: Multidetector CT imaging of the cervical spine was performed without intravenous contrast. Multiplanar CT image reconstructions were also generated. RADIATION DOSE REDUCTION: This exam was  performed according to the departmental dose-optimization program which includes automated exposure control, adjustment of the mA and/or kV according to patient size and/or use of iterative reconstruction technique. COMPARISON:  04/22/2021 FINDINGS: Alignment: Straightening of usual cervical lordosis without anterior subluxation. This may be due to patient positioning but could indicate muscle spasm. Normal alignment of the posterior elements. C1-2 articulation appears intact. Skull base and vertebrae: No acute fracture. No primary bone lesion or focal pathologic process. Soft tissues and spinal canal: No prevertebral fluid or swelling. No visible canal hematoma. Disc levels: Diffuse degenerative change with intervertebral disc space narrowing and endplate osteophyte formation throughout. Degenerative changes in the facet joints. Upper chest: Mild scarring and calcification in the lung apices, likely postinflammatory. Other: None. IMPRESSION: Nonspecific straightening of usual cervical lordosis. Diffuse degenerative change. No acute displaced fractures are identified. Electronically Signed   By: Burman NievesWilliam  Stevens M.D.   On: 08/14/2021 20:52   CT Lumbar Spine Wo Contrast  Result Date: 08/15/2021 CLINICAL DATA:  Low back pain, trauma, fall EXAM: CT LUMBAR SPINE WITHOUT CONTRAST TECHNIQUE: Multidetector CT imaging of the lumbar spine was performed without intravenous contrast administration. Multiplanar CT image reconstructions were also generated. RADIATION DOSE REDUCTION: This exam was performed according to the departmental dose-optimization program which includes automated exposure control, adjustment of the mA and/or kV according to patient size and/or use of iterative reconstruction technique. COMPARISON:  None Available. FINDINGS: Evaluation is somewhat limited by degree of osteopenia segmentation: 5 lumbar type vertebrae. Alignment: Trace anterolisthesis of T11 on T12, in part secondary to the T12  compression fracture. Trace anterolisthesis of L3 on L4 and L4 on L5 which appears facet mediated. Vertebrae: No definite acute fracture. T12 and L1 chronic appearing compression fractures status post kyphoplasty. There is approximately 5 mm of retropulsion of the posterosuperior cortex of T12 and 4 mm retropulsion of the posterosuperior cortex of L1 L4-L5 posterior decompression and fixation with interbody disc spacer. Paraspinal and other soft tissues: Aortic atherosclerosis. Prior cholecystectomy. Diverticulosis without evidence diverticulitis. Disc levels: Mild spinal canal stenosis at T11-T12 and T12-L1 secondary to retropulsed fracture fragments. Moderate spinal canal stenosis at L3-L4 secondary to disc bulge, ligamentum flavum hypertrophy, and facet arthropathy. IMPRESSION: Evaluation is somewhat limited by degree of osteopenia. Within this limitation, no definite acute fracture or traumatic listhesis. Electronically Signed   By: Wiliam KeAlison  Vasan M.D.   On: 08/15/2021 00:27   CT Hip Left Wo Contrast  Result Date: 08/15/2021 CLINICAL DATA:  Hip trauma, fracture suspected. EXAM: CT OF THE LEFT HIP WITHOUT CONTRAST TECHNIQUE: Multidetector CT imaging of the left hip was performed according to the standard protocol. Multiplanar CT image reconstructions were also generated. RADIATION DOSE REDUCTION: This exam was performed according to the departmental dose-optimization program which includes automated exposure control, adjustment  of the mA and/or kV according to patient size and/or use of iterative reconstruction technique. COMPARISON:  08/14/2021. FINDINGS: Bones/Joint/Cartilage No acute fracture or dislocation at the left hip. Mild degenerative changes are present at the left hip. There is a linear lucency involving the lateral aspect of the inferior sacrum on the left and cortical offset, suspicious for nondisplaced fracture. Degenerative changes are noted at the symphysis pubis and sacroiliac joints  bilaterally. Ligaments Suboptimally assessed by CT. Muscles and Tendons There is mild edema and enlargement of the posterior aspect of the obturator internus and piriformis muscles on the left in the region of the presacral space with fat stranding. No significant hematoma is seen. Soft tissues No significant hematoma. Scattered diverticula are noted along the colon without evidence of diverticulitis. There is a left inguinal hernia containing nonobstructed bowel. A Foley catheter is present in the urinary bladder IMPRESSION: 1. No acute fracture at the left hip. 2. Linear lucency and cortical offset in the inferior aspect of the lateral aspect of the sacrum on the left, possible nondisplaced fracture. MRI is suggested for further evaluation. 3. Soft tissue edema and enlargement of the piriformis and obturator internus muscles on the left. No intramuscular hematoma is identified. 4. Left inguinal hernia containing nonobstructed bowel. Electronically Signed   By: Thornell Sartorius M.D.   On: 08/15/2021 00:24   DG Hip Unilat W or Wo Pelvis 2-3 Views Left  Result Date: 08/14/2021 CLINICAL DATA:  Fall, left hip pain EXAM: DG HIP (WITH OR WITHOUT PELVIS) 2-3V LEFT COMPARISON:  None Available. FINDINGS: Normal alignment. No acute fracture or dislocation. Mild left hip degenerative arthritis. Lumbar fusion hardware partially visualized. Foley catheter overlies the expected bladder lumen. Soft tissues are otherwise unremarkable. IMPRESSION: No acute abnormality Electronically Signed   By: Helyn Numbers M.D.   On: 08/14/2021 21:06    CT of hip is negative but there is question of possible nondisplaced fracture of the left sacrum.  MRI is suggested for further evaluation.  CT of lumbar spine without any noted acute fractures.  Unfortunately, at this hour MRI is unavailable.  As patient is unable to ambulate.  Will discuss with hospitalist for admission and obtain MRI in the morning.  1:25 AM Discussed with hospitalist,  Dr. Julian Reil-- recommends to get MRI in the morning and then can call back if acute fractures requiring admission.  Otherwise without admitting diagnosis, would need CSW involvement for alternative placement or PT/OT.  Daughter at bedside made aware of plan for MRI in the morning and further disposition from there.   Care will be signed out to oncoming provider in the morning to follow-up on MRI.    Garlon Hatchet, PA-C 08/15/21 [id number]    Tilden Fossa, MD 08/15/21 [phone]

## 2021-08-15 NOTE — ED Notes (Signed)
Spoke to patient's daughter Dewayne Hatch and provided brief update that pt will be admitted to hospital. Call was cut off but daughter expressed concern that pt had had mini stroke, suspects pt had one earlier this year. Concerned that pt stated her legs felt weak and then she fell. Also stated that pt was slurring words and coughing when drinking water. This writer noted pt coughing when drinking water as well. Informed daughter concerns would be passed on to provider.

## 2021-08-15 NOTE — Evaluation (Signed)
Occupational Therapy Evaluation Patient Details Name: Marie Mitchell MRN: [id number] DOB: 12-Dec-1933 Today's Date: 08/15/2021   History of Present Illness 88 y.o. female with medical history significant of osteoporosis, vitamin d deficiency, anxiety. Presenting with fall.  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  Pelvis BT:DHRCB nondisplaced fracture of the inferior aspect of the left  sacral ala extending into the S3 vertebral body.   Clinical Impression   Patient admitted for the diagnosis above.  PTA she lives at a local ALF, was walking to the common dinning room 200'+ with her 4WRW, and was performing her own ADLs from a sit/stand level.  Decreased initiation, poor static stand balance, slowed movements and responses are some of the deficits impacting independence.  Currently she is needing up to Max A for lower body ADL, and Mod A for basic transfers at [facility] level.  OT will follow in the acute setting, with AIR being recommended for post acute rehab prior to returning home.  Patient has had several falls with compression fractures.        Recommendations for follow up therapy are one component of a multi-disciplinary discharge planning process, led by the attending physician.  Recommendations may be updated based on patient status, additional functional criteria and insurance authorization.   Follow Up Recommendations  Acute inpatient rehab (3hours/day)    Assistance Recommended at Discharge Frequent or constant Supervision/Assistance  Patient can return home with the following A lot of help with walking and/or transfers;A lot of help with bathing/dressing/bathroom;Direct supervision/assist for medications management;Assist for transportation    Functional Status Assessment  Patient has had a recent decline in their functional status and demonstrates the ability to make significant improvements in function in a reasonable and  predictable amount of time.  Equipment Recommendations  Wheelchair (measurements OT);Wheelchair cushion (measurements OT);BSC/3in1    Recommendations for Other Services Rehab consult     Precautions / Restrictions Precautions Precautions: Fall Restrictions Weight Bearing Restrictions: No      Mobility Bed Mobility Overal bed mobility: Needs Assistance Bed Mobility: Supine to Sit     Supine to sit: Mod assist          Transfers Overall transfer level: Needs assistance   Transfers: Sit to/from Stand, Bed to chair/wheelchair/BSC Sit to Stand: Mod assist Stand pivot transfers: Mod assist                Balance Overall balance assessment: Needs assistance Sitting-balance support: Feet supported, Bilateral upper extremity supported Sitting balance-Leahy Scale: Fair   Postural control: Posterior lean Standing balance support: Reliant on assistive device for balance Standing balance-Leahy Scale: Zero Standing balance comment: heavy lean backwards                           ADL either performed or assessed with clinical judgement   ADL Overall ADL's : Needs assistance/impaired Eating/Feeding: NPO   Grooming: Wash/dry hands;Wash/dry face;Min guard;Sitting   Upper Body Bathing: Minimal assistance;Sitting   Lower Body Bathing: Sitting/lateral leans;Maximal assistance   Upper Body Dressing : Minimal assistance;Sitting   Lower Body Dressing: Maximal assistance;Sit to/from stand   Toilet Transfer: Moderate assistance;Stand-pivot;BSC/3in1;Rolling walker (2 wheels)                   Vision Baseline Vision/History: 4 Cataracts Ability to See in Adequate Light: 0 Adequate Patient Visual Report: No change from baseline Vision Assessment?: No apparent visual deficits  Perception Perception Perception: Within Functional Limits   Praxis Praxis Praxis: Intact    Pertinent Vitals/Pain Pain Assessment Pain Assessment: Faces Faces Pain  Scale: No hurt Pain Intervention(s): Monitored during session     Hand Dominance Right   Extremity/Trunk Assessment Upper Extremity Assessment Upper Extremity Assessment: Generalized weakness;RUE deficits/detail;LUE deficits/detail RUE Sensation: WNL RUE Coordination: decreased fine motor;decreased gross motor LUE Sensation: WNL LUE Coordination: decreased fine motor;decreased gross motor   Lower Extremity Assessment Lower Extremity Assessment: Defer to PT evaluation   Cervical / Trunk Assessment Cervical / Trunk Assessment: Kyphotic   Communication Communication Communication: Expressive difficulties   Cognition Arousal/Alertness: Awake/alert Behavior During Therapy: WFL for tasks assessed/performed Overall Cognitive Status: Impaired/Different from baseline Area of Impairment: Orientation, Memory, Following commands, Attention, Problem solving                 Orientation Level: Person, Place, Time Current Attention Level: Selective Memory: Decreased short-term memory Following Commands: Follows one step commands inconsistently     Problem Solving: Decreased initiation, Requires verbal cues, Slow processing       General Comments   VSS on RA    Exercises     Shoulder Instructions      Home Living Family/patient expects to be discharged to:: Assisted living                             Home Equipment: Rolling Walker (2 wheels);Grab bars - toilet;Grab bars - tub/shower;Hand held shower head;Rollator (4 wheels)   Additional Comments: Uses 4WRW to walk to common dinning room.      Prior Functioning/Environment Prior Level of Function : Needs assist;History of Falls (last six months)                        OT Problem List: Decreased strength;Decreased coordination;Impaired balance (sitting and/or standing);Decreased safety awareness;Pain      OT Treatment/Interventions: Self-care/ADL training;Therapeutic exercise;Patient/family  education;Balance training;Therapeutic activities;DME and/or AE instruction    OT Goals(Current goals can be found in the care plan section) Acute Rehab OT Goals Patient Stated Goal: To return to her apartment OT Goal Formulation: With patient Time For Goal Achievement: 08/29/21 Potential to Achieve Goals: Good  OT Frequency: Min 2X/week    Co-evaluation              AM-PAC OT "6 Clicks" Daily Activity     Outcome Measure Help from another person eating meals?: Total Help from another person taking care of personal grooming?: A Little Help from another person toileting, which includes using toliet, bedpan, or urinal?: A Lot Help from another person bathing (including washing, rinsing, drying)?: A Lot Help from another person to put on and taking off regular upper body clothing?: A Little Help from another person to put on and taking off regular lower body clothing?: A Lot 6 Click Score: 13   End of Session Equipment Utilized During Treatment: Gait belt;Rolling walker (2 wheels) Nurse Communication: Mobility status  Activity Tolerance: Patient tolerated treatment well Patient left: in chair;with call bell/phone within reach;with family/visitor present  OT Visit Diagnosis: Unsteadiness on feet (R26.81);Muscle weakness (generalized) (M62.81);History of falling (Z91.81)                Time: 1540-1605 OT Time Calculation (min): 25 min Charges:  OT General Charges $OT Visit: 1 Visit OT Evaluation $OT Eval Moderate Complexity: 1 Mod OT Treatments $Therapeutic Activity: 8-22 mins  08/15/2021  RP,  OTR/L  Acute Rehabilitation Services  Office:  [phone]   Suzanna ObeyRichard D Kati Riggenbach 08/15/2021, 4:21 PM

## 2021-08-15 NOTE — ED Notes (Signed)
Pt transported to MRI 

## 2021-08-15 NOTE — ED Provider Notes (Signed)
Fall at facility, unable to ambulate. CT pelvis and L-spine without finding, MRI sacrum for further review. If fx- will need admit for PT/OT. If unable to admit- will need SW.  Physical Exam  BP (!) 156/113   Pulse 97   Temp 98 F (36.7 C) (Oral)   Resp 16   SpO2 97%   Physical Exam  Procedures  Procedures  ED Course / MDM   Clinical Course as of 08/15/21 0950  Wed Aug 14, 2021  2146 Nursing staff notified me that patient accidentally removed her Foley catheter.  We will place an order for a new Foley. [LJ]    Clinical Course User Index [LJ] Placido Sou, PA-C   Medical Decision Making Amount and/or Complexity of Data Reviewed Labs: ordered. Radiology: ordered.  Risk OTC drugs. Prescription drug management.   MRI with +fracture of sacrum. Hospitalist paged for admission, patient updated with results and plan of care.   Case discussed with hospitalist Dr. Ronaldo Miyamoto, concern for RN note from this morning regarding daughter's Dewayne Hatch) concern for possible stroke, suspects she may have had 1 earlier this year, notes that patient's legs felt weak and then she fell, also notes slurring words and coughing when drinking water. This provider was not informed of this conversation until after consult with Dr. Ronaldo Miyamoto and discussing with RN who reports conversation with daughter on the phone this morning but got disconnected. Patient was reassessed, she is a fairly good historian, tells me about moving here from [place] where she lived [place], her daughter has an 59 area code phone number as she also moved here from [place]. 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  She is aware that it is 2023. She has equal sensation in all 4 extremities, facial movements are symmetric.  She does not enunciate her words however I do not know if this is baseline for her. Call to daughter x 2 unanswered.  Will add MRI brain to help  clarify concern for speech change. Call to Dr. Ronaldo Miyamoto who will see the patient.     Jeannie Fend, PA-C 08/15/21 0950    Tilden Fossa, MD 08/15/21 [phone]

## 2021-08-16 DIAGNOSIS — I639 Cerebral infarction, unspecified: Secondary | ICD-10-CM | POA: Diagnosis not present

## 2021-08-16 LAB — LIPID PANEL
Cholesterol: 236 mg/dL — ABNORMAL HIGH (ref 0–200)
HDL: 104 mg/dL (ref >40)
LDL Cholesterol: 119 mg/dL — ABNORMAL HIGH (ref 0–99)
Total CHOL/HDL Ratio: 2.3 RATIO
Triglycerides: 66 mg/dL (ref <150)
VLDL: 13 mg/dL (ref 0–40)

## 2021-08-16 MED ORDER — ENOXAPARIN SODIUM 40 MG/0.4ML IJ SOSY
40.0000 mg | PREFILLED_SYRINGE | INTRAMUSCULAR | Status: DC
  Administered 2021-08-16 – 2021-08-17 (×2): 40 mg via SUBCUTANEOUS
  Filled 2021-08-16 (×2): qty 0.4

## 2021-08-16 MED ORDER — AMLODIPINE BESYLATE 5 MG PO TABS
5.0000 mg | ORAL_TABLET | Freq: Every day | ORAL | Status: DC
Start: 2021-08-16 — End: 2021-08-17
  Administered 2021-08-16 – 2021-08-17 (×2): 5 mg via ORAL
  Filled 2021-08-16 (×2): qty 1

## 2021-08-16 MED ORDER — ASPIRIN 81 MG PO TBEC
81.0000 mg | DELAYED_RELEASE_TABLET | Freq: Every day | ORAL | Status: DC
  Administered 2021-08-17: 81 mg via ORAL
  Filled 2021-08-16: qty 1

## 2021-08-16 NOTE — Progress Notes (Signed)
Occupational Therapy Treatment Patient Details Name: Marie Mitchell MRN: [id number] DOB: [birth year] Today's Date: 08/16/2021   History of present illness 88 y.o. female presenting to ED with fall at [facility] on 5/24. 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  MRI pelvis showing acute nondisplaced fracture of the inferior aspect of the left  sacral ala extending into the S3 vertebral body. PMH including osteoporosis, vitamin d deficiency, anxiety, and recent falls with T12 compression fx requiring s/p T8, T12, L1 kyphplasty (05/10/21).   OT comments  Pt progressing towards established OT goals and continues to demonstrate high motivation to participate in therapy. Pt requiring initial Mod-Max A for functional mobility due to posterior lean; able to achieve Min A with time. Pt performing oral care at sink with Min A for balance and cues for problem solving. Pt requiring increased time throughout for processing. Continue to recommend dc to AIR and will continue to follow acutely as admitted.    Recommendations for follow up therapy are one component of a multi-disciplinary discharge planning process, led by the attending physician.  Recommendations may be updated based on patient status, additional functional criteria and insurance authorization.    Follow Up Recommendations  Acute inpatient rehab (3hours/day)    Assistance Recommended at Discharge Frequent or constant Supervision/Assistance  Patient can return home with the following  A lot of help with walking and/or transfers;A lot of help with bathing/dressing/bathroom;Direct supervision/assist for medications management;Assist for transportation   Equipment Recommendations  Wheelchair (measurements OT);Wheelchair cushion (measurements OT);BSC/3in1    Recommendations for Other Services Rehab consult    Precautions / Restrictions Precautions Precautions:  Fall Restrictions Weight Bearing Restrictions: No       Mobility Bed Mobility               General bed mobility comments: Sitting in recliner upon arrival    Transfers Overall transfer level: Needs assistance Equipment used: Rolling walker (2 wheels) Transfers: Sit to/from Stand, Bed to chair/wheelchair/BSC Sit to Stand: Mod assist           General transfer comment: Mod A to power up into standing and weight shift forward     Balance Overall balance assessment: Needs assistance Sitting-balance support: Feet supported, Bilateral upper extremity supported Sitting balance-Leahy Scale: Fair     Standing balance support: Reliant on assistive device for balance, During functional activity, Bilateral upper extremity supported, No upper extremity supported Standing balance-Leahy Scale: Poor Standing balance comment: Heavy posterior lean. Able to achieve moments of fair balance at the sink                           ADL either performed or assessed with clinical judgement   ADL Overall ADL's : Needs assistance/impaired     Grooming: Wash/dry hands;Wash/dry face;Min guard;Sitting   Upper Body Bathing: Minimal assistance;Sitting   Lower Body Bathing: Sitting/lateral leans;Maximal assistance   Upper Body Dressing : Minimal assistance;Sitting   Lower Body Dressing: Maximal assistance;Sit to/from stand   Toilet Transfer: Moderate assistance;Stand-pivot;BSC/3in1;Rolling walker (2 wheels)           Functional mobility during ADLs: Minimal assistance;Maximal assistance;Rolling walker (2 wheels) (Initially Max A for correcting posterior lean.) General ADL Comments: Pt presenting with decreased coordination, strength, balance, and processing.    Extremity/Trunk Assessment Upper Extremity Assessment Upper Extremity Assessment: RUE deficits/detail;LUE deficits/detail RUE Deficits / Details: Decreased pinch/grasp strength and in-hand manipulation as seen  during oral care. Decreased coordination RUE Sensation: WNL RUE Coordination: decreased fine motor;decreased gross motor LUE Deficits / Details: decreased strength and coorindation LUE Sensation: WNL LUE Coordination: decreased fine motor;decreased gross motor   Lower Extremity Assessment Lower Extremity Assessment: Defer to PT evaluation        Vision   Vision Assessment?: No apparent visual deficits   Perception Perception Perception: Within Functional Limits   Praxis Praxis Praxis: Intact    Cognition Arousal/Alertness: Awake/alert Behavior During Therapy: WFL for tasks assessed/performed Overall Cognitive Status: Impaired/Different from baseline Area of Impairment: Memory, Following commands, Attention, Problem solving                   Current Attention Level: Selective Memory: Decreased short-term memory Following Commands: Follows one step commands inconsistently     Problem Solving: Decreased initiation, Requires verbal cues, Slow processing General Comments: Demonstrating selective attention to perform ADLs in distracting environment. Unable to divide to perform two tasks together.        Exercises      Shoulder Instructions       General Comments      Pertinent Vitals/ Pain       Pain Assessment Pain Assessment: No/denies pain  Home Living                                          Prior Functioning/Environment              Frequency  Min 2X/week        Progress Toward Goals  OT Goals(current goals can now be found in the care plan section)  Progress towards OT goals: Progressing toward goals  Acute Rehab OT Goals OT Goal Formulation: With patient Time For Goal Achievement: 08/29/21 Potential to Achieve Goals: Good  Plan Discharge plan remains appropriate    Co-evaluation                 AM-PAC OT "6 Clicks" Daily Activity     Outcome Measure   Help from another person eating meals?: A  Lot Help from another person taking care of personal grooming?: A Little Help from another person toileting, which includes using toliet, bedpan, or urinal?: A Lot Help from another person bathing (including washing, rinsing, drying)?: A Lot Help from another person to put on and taking off regular upper body clothing?: A Little Help from another person to put on and taking off regular lower body clothing?: A Lot 6 Click Score: 14    End of Session Equipment Utilized During Treatment: Gait belt;Rolling walker (2 wheels)  OT Visit Diagnosis: Unsteadiness on feet (R26.81);Muscle weakness (generalized) (M62.81);History of falling (Z91.81)   Activity Tolerance Patient tolerated treatment well   Patient Left in chair;with call bell/phone within reach;with family/visitor present   Nurse Communication Mobility status        Time: 0017-4944 OT Time Calculation (min): 40 min  Charges: OT General Charges $OT Visit: 1 Visit OT Treatments $Self Care/Home Management : 38-52 mins  Dajiah Kooi MSOT, OTR/L Acute Rehab Pager: [phone] Office: [phone]  Theodoro Grist Jaylnn Ullery 08/16/2021, 1:21 PM

## 2021-08-16 NOTE — Evaluation (Signed)
Physical Therapy Evaluation Patient Details Name: Marie EvensBridget Gallicchio MRN: [id number] DOB: 1934/02/04 Today's Date: 08/16/2021  History of Present Illness  88 y.o. female presenting to ED with fall at [facility] on 5/24. 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  MRI pelvis showing acute nondisplaced fracture of the inferior aspect of the left  sacral ala extending into the S3 vertebral body. PMH including osteoporosis, vitamin d deficiency, anxiety, and recent falls with T12 compression fx requiring s/p T8, T12, L1 kyphplasty (05/10/21).  Clinical Impression  Pt admitted with above diagnosis. Pt was able to pivot to chair with max assist and cues as pt leans posteriorly and to her right needing incr support and use of RW. Pt will need Rehab to gain strength and endurance prior to d/c back to A living.  Pt currently with functional limitations due to the deficits listed below (see PT Problem List). Pt will benefit from skilled PT to increase their independence and safety with mobility to allow discharge to the venue listed below.          Recommendations for follow up therapy are one component of a multi-disciplinary discharge planning process, led by the attending physician.  Recommendations may be updated based on patient status, additional functional criteria and insurance authorization.  Follow Up Recommendations Acute inpatient rehab (3hours/day)    Assistance Recommended at Discharge Frequent or constant Supervision/Assistance  Patient can return home with the following  A lot of help with walking and/or transfers;A lot of help with bathing/dressing/bathroom;Help with stairs or ramp for entrance;Direct supervision/assist for medications management;Direct supervision/assist for financial management;Assistance with cooking/housework    Equipment Recommendations None recommended by PT  Recommendations for Other Services   Rehab consult    Functional Status Assessment Patient has had a recent decline in their functional status and demonstrates the ability to make significant improvements in function in a reasonable and predictable amount of time.     Precautions / Restrictions Precautions Precautions: Fall Restrictions Weight Bearing Restrictions: No      Mobility  Bed Mobility Overal bed mobility: Needs Assistance Bed Mobility: Supine to Sit     Supine to sit: Min assist     General bed mobility comments: If given time, pt can move to EOB without much assist.  Did assist to get hips to EOB with use of pad once pt was sitting with LEs off bed.    Transfers Overall transfer level: Needs assistance Equipment used: Rolling walker (2 wheels) Transfers: Sit to/from Stand, Bed to chair/wheelchair/BSC Sit to Stand: Mod assist, Max assist, From elevated surface   Step pivot transfers: Mod assist, Max assist       General transfer comment: Mod to max A to power up into standing and weight shift forward.  Pt with forward lean onto RW and hips shifted to her right side. Pt had diffifulty with anterior weight shift and keeps buttocks posterior to body. Also with narrow BOS and would not widen it even wtih commands.  She was able to step around to the chair with mod to max assist.    Ambulation/Gait                  Stairs            Wheelchair Mobility    Modified Rankin (Stroke Patients Only)       Balance Overall balance assessment: Needs assistance Sitting-balance support: Feet supported, Bilateral upper extremity supported Sitting  balance-Leahy Scale: Fair Sitting balance - Comments: Can sit EOB with supervision Postural control: Posterior lean Standing balance support: Reliant on assistive device for balance, During functional activity, Bilateral upper extremity supported, No upper extremity supported Standing balance-Leahy Scale: Poor Standing balance comment: Heavy  posterior lean. Able to achieve better posture with cues, assist and weight shifting manually by therapist.                             Pertinent Vitals/Pain Pain Assessment Pain Assessment: No/denies pain Faces Pain Scale: No hurt    Home Living Family/patient expects to be discharged to:: Assisted living                 Home Equipment: Rolling Walker (2 wheels);Grab bars - toilet;Grab bars - tub/shower;Hand held shower head;Rollator (4 wheels) Additional Comments: Uses 4WRW to walk to common dinning room.    Prior Function Prior Level of Function : Needs assist;History of Falls (last six months)                     Hand Dominance   Dominant Hand: Right    Extremity/Trunk Assessment   Upper Extremity Assessment Upper Extremity Assessment: Defer to OT evaluation RUE Deficits / Details: Decreased pinch/grasp strength and in-hand manipulation as seen during oral care. Decreased coordination RUE Sensation: WNL RUE Coordination: decreased fine motor;decreased gross motor LUE Deficits / Details: decreased strength and coorindation LUE Sensation: WNL LUE Coordination: decreased fine motor;decreased gross motor    Lower Extremity Assessment Lower Extremity Assessment: RLE deficits/detail;LLE deficits/detail RLE Deficits / Details: grossly 3/5 RLE Coordination: decreased gross motor LLE Deficits / Details: grossly 3/5 LLE Coordination: decreased gross motor    Cervical / Trunk Assessment Cervical / Trunk Assessment: Kyphotic  Communication   Communication: Expressive difficulties  Cognition Arousal/Alertness: Awake/alert Behavior During Therapy: WFL for tasks assessed/performed Overall Cognitive Status: Impaired/Different from baseline Area of Impairment: Memory, Following commands, Attention, Problem solving                 Orientation Level: Person, Place, Time Current Attention Level: Selective Memory: Decreased short-term  memory Following Commands: Follows one step commands inconsistently     Problem Solving: Decreased initiation, Requires verbal cues, Slow processing General Comments: Demonstrating selective attention to perform ADLs in distracting environment. Unable to divide to perform two tasks together.        General Comments General comments (skin integrity, edema, etc.): VSS    Exercises     Assessment/Plan    PT Assessment Patient needs continued PT services  PT Problem List Decreased activity tolerance;Decreased balance;Decreased mobility;Decreased knowledge of use of DME;Decreased safety awareness;Decreased knowledge of precautions;Decreased coordination       PT Treatment Interventions DME instruction;Functional mobility training;Therapeutic activities;Therapeutic exercise;Balance training;Patient/family education;Gait training    PT Goals (Current goals can be found in the Care Plan section)  Acute Rehab PT Goals Patient Stated Goal: unable to state PT Goal Formulation: Patient unable to participate in goal setting Time For Goal Achievement: 08/30/21 Potential to Achieve Goals: Good    Frequency Min 4X/week     Co-evaluation               AM-PAC PT "6 Clicks" Mobility  Outcome Measure Help needed turning from your back to your side while in a flat bed without using bedrails?: A Little Help needed moving from lying on your back to sitting on the side of a flat bed  without using bedrails?: A Little Help needed moving to and from a bed to a chair (including a wheelchair)?: A Lot Help needed standing up from a chair using your arms (e.g., wheelchair or bedside chair)?: A Lot Help needed to walk in hospital room?: Total Help needed climbing 3-5 steps with a railing? : Total 6 Click Score: 12    End of Session Equipment Utilized During Treatment: Gait belt Activity Tolerance: Patient limited by fatigue Patient left: in chair;with call bell/phone within reach;with chair  alarm set Nurse Communication: Mobility status PT Visit Diagnosis: Unsteadiness on feet (R26.81);Muscle weakness (generalized) (M62.81)    Time: 1245-8099 PT Time Calculation (min) (ACUTE ONLY): 17 min   Charges:   PT Evaluation $PT Eval Moderate Complexity: 1 Mod          Atziry Baranski M,PT Acute Rehab Services [phone] (pager)   Bevelyn Buckles 08/16/2021, 2:18 PM

## 2021-08-16 NOTE — Progress Notes (Signed)
Inpatient Rehab Admissions Coordinator Note:   Per therapy recommendations patient was screened for CIR candidacy by Stephania Fragmin, PT. At this time, pt appears to be a potential candidate for CIR. I will place an order for rehab consult for full assessment, per our protocol.  Please contact me any with questions.Estill Dooms, PT, DPT [phone] 08/16/21 3:00 PM

## 2021-08-16 NOTE — Plan of Care (Signed)
  Problem: Health Behavior/Discharge Planning: Goal: Ability to manage health-related needs will improve Outcome: Progressing   Problem: Clinical Measurements: Goal: Ability to maintain clinical measurements within normal limits will improve Outcome: Progressing Goal: Will remain free from infection Outcome: Progressing Goal: Diagnostic test results will improve Outcome: Progressing Goal: Respiratory complications will improve Outcome: Progressing Goal: Cardiovascular complication will be avoided Outcome: Progressing   Problem: Activity: Goal: Risk for activity intolerance will decrease Outcome: Progressing   Problem: Nutrition: Goal: Adequate nutrition will be maintained Outcome: Progressing   Problem: Elimination: Goal: Will not experience complications related to bowel motility Outcome: Progressing Goal: Will not experience complications related to urinary retention Outcome: Progressing   Problem: Pain Managment: Goal: General experience of comfort will improve Outcome: Progressing   Problem: Skin Integrity: Goal: Risk for impaired skin integrity will decrease Outcome: Progressing   

## 2021-08-16 NOTE — Consult Note (Addendum)
Neurology Consultation Reason for Consult: Stroke Requesting Physician: Maurilio Lovely  CC: "I fell"  History is obtained from: Patient and chart review   HPI: Marie Mitchell is a 88 y.o. female with a past medical history significant for recent kyphoplasty (T8, T12, L1, 07/08/2021), frequent falls, chronic Foley catheter.  She presented after another fall, and was found to have a sacral fracture.  Additionally while here she was noted to have some difficulty drinking water and slurred speech for which MRI brain was obtained which demonstrated an acute infarct for which neurology is consulted.  She is still slightly confused on my evaluation, for example when I ask about her stroke she starts explaining little bit about her fracture to me.  She denies any headache or other acute pain at this time.  Per chart review, her daughter reported that she does have some sundowning at home, but typically does not have this much weakness  Per nurse at bedside she is markedly improved at the time of my bedside evaluation at 4:15 AM then she was the day before  LKW: 2 days prior to admission tPA given?: No, out of the window Premorbid modified rankin scale: 3-4     3 - Moderate disability. Requires some help, but able to walk unassisted.     4 - Moderately severe disability. Unable to attend to own bodily needs without assistance, and unable to walk unassisted.  ROS: Unable to obtain due to altered mental status.   History reviewed. No pertinent past medical history. See pertinent history in HPI above   Current Outpatient Medications  Medication Instructions   acetaminophen (TYLENOL) 500 mg, Oral, Every 6 hours PRN   calcium carbonate (OSCAL) 600-1,200 mg, Oral, See admin instructions, Give 2 tablets by mouth in the morning then give 1 tablet every evening   Cholecalciferol 1,000 Units, Oral, Nightly   FLUoxetine (PROZAC) 30 mg, Oral, Daily   polyethylene glycol (MIRALAX / GLYCOLAX) 17 g, Oral,  Daily PRN   Turmeric 500 mg, Oral, 2 times daily   vitamin B-12 (CYANOCOBALAMIN) 1,000 mcg, Oral, Daily    No family history on file.  Social History:  reports that she has never smoked. She has never used smokeless tobacco. She reports current alcohol use. No history on file for drug use.   Exam: Current vital signs: BP (!) 153/87 (BP Location: Right Arm)   Pulse 80   Temp 99.2 F (37.3 C) (Oral)   Resp 18   SpO2 95%  Vital signs in last 24 hours: Temp:  [97.9 F (36.6 C)-99.2 F (37.3 C)] 99.2 F (37.3 C) (05/25 2317) Pulse Rate:  [66-108] 80 (05/25 2317) Resp:  [14-21] 18 (05/25 2317) BP: (139-166)/(83-129) 153/87 (05/25 2317) SpO2:  [94 %-98 %] 95 % (05/25 2317)   Physical Exam  Constitutional: Appears frail Psych: Affect appropriate to situation, calm and cooperative Eyes: No scleral injection HENT: No oropharyngeal obstruction.  MSK: no joint deformities.  Cardiovascular: Normal rate and regular rhythm. Perfusing extremities well Respiratory: Effort normal, non-labored breathing GI: Soft.  No distension.  Skin: Warm dry and intact visible skin  Neuro: Mental Status: Patient is awake, alert, oriented to person, place, month, though a bit unclear on details of the situation. She has mild aphasia, for example naming the band of the watch as "wrist" and having some mild speech hesitation and some missed words when asked to repeat Cranial Nerves: II: Visual Fields are full. Pupils are equal, round, and reactive to light.   III,IV,  VI: EOMI without ptosis or diploplia.  V: Facial sensation is symmetric to temperature VII: Facial movement is notable for mild right facial droop VIII: hearing is intact to voice with hearing aids in place X: Uvula elevates symmetrically XI: Shoulder shrug is slightly weaker on the right side XII: tongue is midline without atrophy or fasciculations.  Motor: Tone is normal. Bulk is normal. 5/5 strength was present in all four  extremities, other than mild deltoid and elbow extension weakness on the right side 4+/5.  She is bradykinetic with right upper extremity movements.  She does have some slight pronator drift of the right upper extremity Sensory: Sensation is symmetric to light touch in the arms and legs. Deep Tendon Reflexes: 3+ in the bilateral biceps and brachioradialis, 2+ in the bilateral patellae  Cerebellar: FNF and HKS are intact bilaterally Gait:  Deferred   NIHSS total 5 Score breakdown: One-point for slight drowsiness, one-point for right facial droop, one-point for right upper extremity pronator drift, one-point for mild aphasia (question if this is baseline), one-point for mild dysarthria Performed at 4 AM on 5/26   I have reviewed labs in epic and the results pertinent to this consultation are:  Basic Metabolic Panel: Recent Labs  Lab 08/14/21 2153  NA 138  K 3.6  CL 107  CO2 26  GLUCOSE 116*  BUN 20  CREATININE 1.04*  CALCIUM 8.7*  GFR 52, (Baseline Cr 0.8)  Lab Results  Component Value Date   ALT 25 08/14/2021   AST 30 08/14/2021   ALKPHOS 53 08/14/2021   BILITOT 0.4 08/14/2021    CBC: Recent Labs  Lab 08/14/21 2153  WBC 7.5  NEUTROABS 6.2  HGB 13.0  HCT 41.3  MCV 96.0  PLT 226    Coagulation Studies: No results for input(s): LABPROT, INR in the last 72 hours.    I have reviewed the images obtained:  5/24 Head CT personally reviewed, agree with radiology: No acute intracranial abnormalities. Chronic atrophy and small vessel ischemic changes.  5/25 MRI brain personally reviewed, agree with radiology: Acute perforator infarct in the left basal ganglia, extending into overlying corona radiata. Slight edema without mass effect. [3-4 foci of  cortical microhemorrhages on GRE sequences]   5/25 CTA reviewed, agree with radiology  CTA neck:  1. The common carotid, internal carotid and vertebral arteries are patent within the neck without stenosis or  significant atherosclerotic disease. 2. Cervical spondylosis and cervical dextrocurvature. 3. Nonspecific 10 mm cutaneous/subcutaneous lesion within the left perimandibular soft tissues. Direct visualization recommended.   CTA head: 1. No intracranial large vessel occlusion is identified. 2. 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 3. Mild focal stenosis within the left PCA at the P1/P2 junction.  ECHO  1. Left ventricular ejection fraction, by estimation, is 55 to 60%. Left  ventricular ejection fraction by 2D MOD biplane is 59.3 %. The left  ventricle has normal function. The left ventricle has no regional wall  motion abnormalities. Left ventricular  diastolic parameters are consistent with Grade I diastolic dysfunction  (impaired relaxation).   2. Right ventricular systolic function is normal. The right ventricular  size is normal. There is normal pulmonary artery systolic pressure. The  estimated right ventricular systolic pressure is 25.5 mmHg.   3. The mitral valve is grossly normal. Trivial mitral valve  regurgitation.   4. The aortic valve is tricuspid. Aortic valve regurgitation is not  visualized.   5. The inferior vena cava  is normal in size with greater than 50%  respiratory variability, suggesting right atrial pressure of 3 mmHg [Normal biatrial sizes]  Lab Results  Component Value Date   HGBA1C 5.3 08/15/2021    No results found for: CHOL, HDL, LDLCALC, LDLDIRECT, TRIG, CHOLHDL [pending]  Impression: Likely atheroembolic or small vessel stroke in the setting of some intracranial atherosclerotic disease (severe focal stenosis within the left callosomarginal artery)  Recommendations: # Left basal ganglia / corona radiata stroke, likely atheroembolic - Stroke labs: HgbA1c meeting goal of < 7 (5.3%) - fasting lipid panel pending, goal LDL < 70, please initiate atorvastatin 40 to 80 mg if LDL is above goal - Frequent  neuro checks - Prophylactic therapy-Antiplatelet med: Aspirin - dose 325mg  PO or 300mg  PR, followed by 81 mg daily - Plavix 300 mg load with 75 mg daily for 21 day course once patient able to take PO -- appreciate primary team ordering this if there is no operative intervention planned for the sacral fracture that would make Plavix contraindicated - Risk factor modification - Telemetry monitoring - Blood pressure goal   - Normotension as she is out of the window for permissive HTN - PT consult, OT consult, Speech consult - Given stroke mechanism has been elucidated and work-up is complete, neurology will be available on an as-needed basis, please reach out if new questions or concerns arise  Brooke DareSrishti Jazzy Parmer MD-PhD [facility] [phone] Available 7 PM to 7 AM, outside of these hours please call Neurologist on call as listed on Amion.

## 2021-08-16 NOTE — TOC CAGE-AID Note (Signed)
Transition of Care [facility]) - CAGE-AID Screening   Patient Details  Name: Marie Mitchell MRN: [id number] Date of Birth: [birth year]  [facility] CM/SW Contact:    Bradie Lacock C Tarpley-Carter, LCSWA Phone Number: 08/16/2021, 9:36 AM   Clinical Narrative: Pt participated in Cage-Aid.  Pt stated she does not use substance or ETOH.  Pt was not offered resources, due to no usage of substance or ETOH.     Doyal Saric Tarpley-Carter, MSW, LCSW-A Pronouns:  She/Her/Hers [facility] Clinical Social Worker Direct Number:  [phone] [email or web address]  CAGE-AID Screening:    Have You Ever Felt You Ought to Cut Down on Your Drinking or Drug Use?: No Have People Annoyed You By [employer] Your Drinking Or Drug Use?: No Have You Felt Bad Or Guilty About Your Drinking Or Drug Use?: No Have You Ever Had a Drink or Used Drugs First Thing In The Morning to Steady Your Nerves or to Get Rid of a Hangover?: No CAGE-AID Score: 0  Substance Abuse Education Offered: No

## 2021-08-16 NOTE — Hospital Course (Addendum)
88 y.o. female with medical history significant of osteoporosis, vitamin d deficiency, anxiety. Presenting with fall. History is from her daughter. 2 nights PTA she fell after closing her blinds in her room. Unclear if she had and head injury or LOC. She decided not to come to ED 5/25.  5/24 afternoon, she was becoming more confused and said it was difficult to see and write. She went to make dinner in her kitchen. Her legs felt weak and she fell. There was now head injury or LOC. EMS was alerted and she was brought to the ED. The daughter was with her last night in the ED and she noticed that she was slurring speech and choking on water. Patient was admitted for further work-up MRI brain showed acute perforator infarct in the left basal ganglia extending into the overlying corona radiata.MRI sacrum acute nondisplaced fracture of the inferior aspect of the left sacral ala Extending into the S3 vertebral body with associated marrow edema and adjacent soft tissue edema, mild muscle edema in the left pyriformis muscle likely reflecting reactive edema.Neurology was consulted

## 2021-08-16 NOTE — Progress Notes (Addendum)
PROGRESS NOTE Seletha Zimmermann  ZOX:[id number] DOB: Oct 26, 1933 DOA: 08/14/2021 PCP: System, Provider Not In   Brief Narrative/Hospital Course: 88 y.o. female with medical history significant of osteoporosis, vitamin d deficiency, anxiety. Presenting with fall. History is from her daughter. 2 nights PTA she fell after closing her blinds in her room. Unclear if she had and head injury or LOC. She decided not to come to ED 5/25.  5/24 afternoon, she was becoming more confused and said it was difficult to see and write. She went to make dinner in her kitchen. Her legs felt weak and she fell. There was now head injury or LOC. EMS was alerted and she was brought to the ED. The daughter was with her last night in the ED and she noticed that she was slurring speech and choking on water. Patient was admitted for further work-up MRI brain showed acute perforator infarct in the left basal ganglia extending into the overlying corona radiata.MRI sacrum acute nondisplaced fracture of the inferior aspect of the left sacral ala Extending into the S3 vertebral body with associated marrow edema and adjacent soft tissue edema, mild muscle edema in the left pyriformis muscle likely reflecting reactive edema.Neurology was consulted    Subjective: Seen and examined this morning she is alert awake oriented, appears weak, ill looking and frail. No pain on her pelvis while rested. Able to move her extremities well.   Assessment and Plan: Principal Problem:   Acute CVA (cerebrovascular accident) (HCC) Active Problems:   Falls frequently   Sacral fracture (HCC)   HTN (hypertension)   Anxiety   Osteoporosis   Left basal ganglia/corona radiata stroke likely atheroembolic: Patient completed stroke work-up A1c at goal less than 7, lipid panel-LDL 119 goal less than 70 initiate statin, echocardiogram EF 55 to 60%, grade 1 DD normal RV systolic function mitral valve normal aortic valve tricuspid.CTA head and neck shows patent  common carotid internal carotid and vertebral arteries, nonspecific 10 mm cutaneous/subcutaneous lesion within the left perimandibular soft tissue, 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 Dr Iver Nestle initially advised prophylactic antiplatelet with aspirin 81 daily after loading dose and Plavix  x 90 days I further discussed with Dr Iver Nestle and Dr Pearlean Brownie at 5 pm-  Callosal margin is ACA territory and  acute stroke is in MCA territory-and  asa 81 and Plavix is to be continued for 3 wks then asa alone.Cont risk factor modification telemetry monitoring and normotension as BP goal. OT advised  CIR pending. She will need to fu with Dr Pearlean Brownie who will manage on post stroke follow up- please f/u Dr Iver Nestle addended note this evening.  Falls frequently Sacral fracture: Seen in MRI as above continue pain control PT OT. Discussed with my and Dr. Jena Gauss reviewed the film WBAT- OP f/u.  HTN:BP not controlled, goal is normotension as she is out of window for permissive HTN-started on amlodipine 5 mg. Anxiety:cont home meds-Prozac. Osteoporosis: Cont Vitamin D supplementation.  DVT prophylaxis: enoxaparin (LOVENOX) injection 40 mg Start: 08/16/21 1000 SCD's Start: 08/15/21 1138 Code Status:   Code Status: Full Code Family Communication: plan of care discussed with patient at bedside. Daughter and son in law -updated on phone in details. Patient status is: Inpatient because of ongoing management of his stroke and sacral fracture PT OT evaluation Level of care: Telemetry Medical   Dispo: The patient is from:Home            Anticipated disposition:TBD pending PT eval,OT recs  CIR.  Mobility Assessment (last 72 hours)     Mobility Assessment     Row Name 08/16/21 0800 08/15/21 2015 08/15/21 1617 08/15/21 1400     Does patient have an order for bedrest or is patient medically unstable Yes- Bedfast (Level 1) - Complete Yes- Bedfast (Level 1) - Complete -- Yes-  Bedfast (Level 1) - Complete    What is the highest level of mobility based on the progressive mobility assessment? Level 1 (Bedfast) - Unable to balance while sitting on edge of bed Level 2 (Chairfast) - Balance while sitting on edge of bed and cannot stand Level 2 (Chairfast) - Balance while sitting on edge of bed and cannot stand Level 1 (Bedfast) - Unable to balance while sitting on edge of bed    Is the above level different from baseline mobility prior to current illness? Yes - Recommend PT order Yes - Recommend PT order -- Yes - Recommend PT order            Objective: Vitals last 24 hrs: Vitals:   08/15/21 2317 08/16/21 0316 08/16/21 0801 08/16/21 0803  BP: (!) 153/87 (!) 159/101 (!) 163/113 (!) 154/102  Pulse: 80 88 89 90  Resp: 18 20 20 16   Temp: 99.2 F (37.3 C) 98.7 F (37.1 C) 98 F (36.7 C)   TempSrc: Oral Oral Oral   SpO2: 95% 97% 97% 96%   Weight change:   Physical Examination: General exam: alert awake, at baseline,older than stated age, weak appearing. HEENT:Oral mucosa moist, Ear/Nose WNL grossly, dentition normal. Respiratory system: bilaterally diminished BS, no use of accessory muscle Cardiovascular system: S1 & S2 +, No JVD. Gastrointestinal system: Abdomen soft,NT,ND, BS+ Nervous System:Alert, awake, moving extremities and grossly nonfocal Extremities: LE edema none,distal peripheral pulses palpable.  Skin: No rashes,no icterus. MSK: Normal muscle bulk,tone, power  Medications reviewed:  Scheduled Meds:  amLODipine  5 mg Oral Daily   aspirin  300 mg Rectal Daily   Or   aspirin  325 mg Oral Daily   atorvastatin  40 mg Oral Daily   calcium-vitamin D  1 tablet Oral QHS   calcium-vitamin D  2 tablet Oral Q breakfast   Chlorhexidine Gluconate Cloth  6 each Topical Daily   cholecalciferol  1,000 Units Oral QHS   clopidogrel  75 mg Oral Daily   enoxaparin (LOVENOX) injection  40 mg Subcutaneous Q24H   FLUoxetine  30 mg Oral Daily  Continuous  Infusions:   Diet Order             Diet regular Room service appropriate? Yes; Fluid consistency: Thin  Diet effective now                  Intake/Output Summary (Last 24 hours) at 08/16/2021 1112 Last data filed at 08/16/2021 0900 Gross per 24 hour  Intake 670.39 ml  Output 2275 ml  Net -1604.61 ml   Net IO Since Admission: -4,854.61 mL [08/16/21 1112]  Wt Readings from Last 3 Encounters:  04/22/21 59 kg  02/08/21 59 kg  07/29/19 65.8 kg     Unresulted Labs (From admission, onward)     Start     Ordered   08/23/21 0500  Creatinine, serum  (enoxaparin (LOVENOX)    CrCl >/= 30 ml/min)  Weekly,   R     Comments: while on enoxaparin therapy    08/16/21 08/18/21          Data Reviewed: I have personally reviewed following labs and  imaging studies CBC: Recent Labs  Lab 08/14/21 2153  WBC 7.5  NEUTROABS 6.2  HGB 13.0  HCT 41.3  MCV 96.0  PLT 226   Basic Metabolic Panel: Recent Labs  Lab 08/14/21 2153  NA 138  K 3.6  CL 107  CO2 26  GLUCOSE 116*  BUN 20  CREATININE 1.04*  CALCIUM 8.7*   GFR: CrCl cannot be calculated (Unknown ideal weight.). Liver Function Tests: Recent Labs  Lab 08/14/21 2153  AST 30  ALT 25  ALKPHOS 53  BILITOT 0.4  PROT 6.5  ALBUMIN 3.6   No results for input(s): LIPASE, AMYLASE in the last 168 hours. No results for input(s): AMMONIA in the last 168 hours. Coagulation Profile: No results for input(s): INR, PROTIME in the last 168 hours. BNP (last 3 results) No results for input(s): PROBNP in the last 8760 hours. HbA1C: Recent Labs    08/15/21 1138  HGBA1C 5.3   CBG: No results for input(s): GLUCAP in the last 168 hours. Lipid Profile: Recent Labs    08/16/21 0412  CHOL 236*  HDL 104  LDLCALC 119*  TRIG 66  CHOLHDL 2.3   Thyroid Function Tests: No results for input(s): TSH, T4TOTAL, FREET4, T3FREE, THYROIDAB in the last 72 hours. Sepsis Labs: No results for input(s): PROCALCITON, LATICACIDVEN in the last  168 hours.  No results found for this or any previous visit (from the past 240 hour(s)).  Antimicrobials: Anti-infectives (From admission, onward)    None      Culture/Microbiology    Component Value Date/Time   SDES  04/22/2021 2333    URINE, CLEAN CATCH Performed at [facility], [address], [place], [place] [address]    [facility]  04/22/2021 2333    NONE Performed at [facility], [address], [place], [place] [address]    CULT >=100,000 COLONIES/mL KLEBSIELLA PNEUMONIAE (A) 04/22/2021 2333   REPTSTATUS 04/25/2021 FINAL 04/22/2021 2333    Other culture-see note  Radiology Studies: CT HEAD WO CONTRAST ( )  Result Date: 08/14/2021 CLINICAL DATA:  Head trauma due to a fall. No loss of consciousness. No blood thinner use. EXAM: CT HEAD WITHOUT CONTRAST TECHNIQUE: Contiguous axial images were obtained from the base of the skull through the vertex without intravenous contrast. RADIATION DOSE REDUCTION: This exam was performed according to the departmental dose-optimization program which includes automated exposure control, adjustment of the mA and/or kV according to patient size and/or use of iterative reconstruction technique. COMPARISON:  04/22/2021 FINDINGS: Brain: Diffuse cerebral atrophy. Ventricular dilatation consistent with central atrophy. Low-attenuation changes in the deep white matter consistent with small vessel ischemia. No abnormal extra-axial fluid collections. No mass effect or midline shift. Gray-white matter junctions are distinct. Basal cisterns are not effaced. No acute intracranial hemorrhage. Vascular: Intracranial arterial vascular calcifications. Skull: Calvarium appears intact. Small subcutaneous scalp hematoma over the anterior frontal region. Sinuses/Orbits: Paranasal sinuses and mastoid air cells are clear. Other: None. IMPRESSION: No acute intracranial abnormalities. Chronic atrophy and small vessel ischemic  changes. Electronically Signed   By: Burman Nieves M.D.   On: 08/14/2021 20:50   CT Cervical Spine Wo Contrast  Result Date: 08/14/2021 CLINICAL DATA:  Acute neck pain after a fall. EXAM: CT CERVICAL SPINE WITHOUT CONTRAST TECHNIQUE: Multidetector CT imaging of the cervical spine was performed without intravenous contrast. Multiplanar CT image reconstructions were also generated. RADIATION DOSE REDUCTION: This exam was performed according to the departmental dose-optimization program which includes automated exposure control, adjustment of the mA and/or  kV according to patient size and/or use of iterative reconstruction technique. COMPARISON:  04/22/2021 FINDINGS: Alignment: Straightening of usual cervical lordosis without anterior subluxation. This may be due to patient positioning but could indicate muscle spasm. Normal alignment of the posterior elements. C1-2 articulation appears intact. Skull base and vertebrae: No acute fracture. No primary bone lesion or focal pathologic process. Soft tissues and spinal canal: No prevertebral fluid or swelling. No visible canal hematoma. Disc levels: Diffuse degenerative change with intervertebral disc space narrowing and endplate osteophyte formation throughout. Degenerative changes in the facet joints. Upper chest: Mild scarring and calcification in the lung apices, likely postinflammatory. Other: None. IMPRESSION: Nonspecific straightening of usual cervical lordosis. Diffuse degenerative change. No acute displaced fractures are identified. Electronically Signed   By: Burman NievesWilliam  Stevens M.D.   On: 08/14/2021 20:52   CT Lumbar Spine Wo Contrast  Result Date: 08/15/2021 CLINICAL DATA:  Low back pain, trauma, fall EXAM: CT LUMBAR SPINE WITHOUT CONTRAST TECHNIQUE: Multidetector CT imaging of the lumbar spine was performed without intravenous contrast administration. Multiplanar CT image reconstructions were also generated. RADIATION DOSE REDUCTION: This exam was  performed according to the departmental dose-optimization program which includes automated exposure control, adjustment of the mA and/or kV according to patient size and/or use of iterative reconstruction technique. COMPARISON:  None Available. FINDINGS: Evaluation is somewhat limited by degree of osteopenia segmentation: 5 lumbar type vertebrae. Alignment: Trace anterolisthesis of T11 on T12, in part secondary to the T12 compression fracture. Trace anterolisthesis of L3 on L4 and L4 on L5 which appears facet mediated. Vertebrae: No definite acute fracture. T12 and L1 chronic appearing compression fractures status post kyphoplasty. There is approximately 5 mm of retropulsion of the posterosuperior cortex of T12 and 4 mm retropulsion of the posterosuperior cortex of L1 L4-L5 posterior decompression and fixation with interbody disc spacer. Paraspinal and other soft tissues: Aortic atherosclerosis. Prior cholecystectomy. Diverticulosis without evidence diverticulitis. Disc levels: Mild spinal canal stenosis at T11-T12 and T12-L1 secondary to retropulsed fracture fragments. Moderate spinal canal stenosis at L3-L4 secondary to disc bulge, ligamentum flavum hypertrophy, and facet arthropathy. IMPRESSION: Evaluation is somewhat limited by degree of osteopenia. Within this limitation, no definite acute fracture or traumatic listhesis. Electronically Signed   By: Wiliam KeAlison  Vasan M.D.   On: 08/15/2021 00:27   MR BRAIN WO CONTRAST  Result Date: 08/15/2021 CLINICAL DATA:  Neuro deficit, acute, stroke suspected EXAM: MRI HEAD WITHOUT CONTRAST TECHNIQUE: Multiplanar, multiecho pulse sequences of the brain and surrounding structures were obtained without intravenous contrast. COMPARISON:  CT head Aug 14, 2021. FINDINGS: Brain: Acute perforator infarct in the left basal ganglia, extending into overlying corona radiata. Slight edema without mass effect. Mild to moderate additional scattered T2/FLAIR hyperintensities in the white  matter, nonspecific but compatible with chronic microvascular disease. No evidence of acute hemorrhage, mass lesion, midline shift, hydrocephalus, or extra-axial fluid collection. A few small foci of susceptibility artifact, compatible with prior microhemorrhages. Vascular: Major arterial flow voids are maintained skull base. Skull and upper cervical spine: Normal marrow signal. Sinuses/Orbits: Clear sinuses.  No acute orbital findings. Other: No mastoid effusions IMPRESSION: Acute perforator infarct in the left basal ganglia, extending into overlying corona radiata. Slight edema without mass effect. Electronically Signed   By: Feliberto HartsFrederick S Jones M.D.   On: 08/15/2021 10:35   MR SACRUM SI JOINTS WO CONTRAST  Result Date: 08/15/2021 CLINICAL DATA:  Hip trauma, left-sided pain. EXAM: MRI LUMBAR SPINE WITHOUT CONTRAST TECHNIQUE: Multiplanar, multisequence MR imaging of the lumbar spine was performed.  No intravenous contrast was administered. COMPARISON:  CT hip 08/14/2021 FINDINGS: Patient motion degrades image quality limiting evaluation. Bones/Joint/Cartilage Acute nondisplaced fracture of the inferior aspect of the left sacral ala extending into the S3 vertebral body with associated marrow edema and adjacent soft tissue edema. No other acute fracture or subluxation. Normal alignment. No joint effusion. Mild osteoarthritis of bilateral SI joints. No SI joint widening or erosive changes. No subchondral reactive marrow changes. No SI joint effusion. Partially visualized posterior lumbar fusion at L4-5. Mild broad-based disc bulge at L5-S1 and bilateral facet arthropathy. Lumbar spine is better evaluated on the CT of the lumbar spine performed earlier same day and reported separately. Ligaments, Muscles and Tendons Mild muscle edema in the left piriformis muscle likely reflecting reactive edema secondary to the adjacent sacral fracture versus mild muscle strain. No intramuscular fluid collection or hematoma. Soft  tissue No fluid collection or hematoma. No soft tissue mass. Normal neurovascular bundles. IMPRESSION: 1. Acute nondisplaced fracture of the inferior aspect of the left sacral ala extending into the S3 vertebral body with associated marrow edema and adjacent soft tissue edema. 2. Mild muscle edema in the left piriformis muscle likely reflecting reactive edema secondary to the adjacent sacral fracture versus mild muscle strain. Electronically Signed   By: Elige Ko M.D.   On: 08/15/2021 08:11   CT Hip Left Wo Contrast  Result Date: 08/15/2021 CLINICAL DATA:  Hip trauma, fracture suspected. EXAM: CT OF THE LEFT HIP WITHOUT CONTRAST TECHNIQUE: Multidetector CT imaging of the left hip was performed according to the standard protocol. Multiplanar CT image reconstructions were also generated. RADIATION DOSE REDUCTION: This exam was performed according to the departmental dose-optimization program which includes automated exposure control, adjustment of the mA and/or kV according to patient size and/or use of iterative reconstruction technique. COMPARISON:  08/14/2021. FINDINGS: Bones/Joint/Cartilage No acute fracture or dislocation at the left hip. Mild degenerative changes are present at the left hip. There is a linear lucency involving the lateral aspect of the inferior sacrum on the left and cortical offset, suspicious for nondisplaced fracture. Degenerative changes are noted at the symphysis pubis and sacroiliac joints bilaterally. Ligaments Suboptimally assessed by CT. Muscles and Tendons There is mild edema and enlargement of the posterior aspect of the obturator internus and piriformis muscles on the left in the region of the presacral space with fat stranding. No significant hematoma is seen. Soft tissues No significant hematoma. Scattered diverticula are noted along the colon without evidence of diverticulitis. There is a left inguinal hernia containing nonobstructed bowel. A Foley catheter is present in  the urinary bladder IMPRESSION: 1. No acute fracture at the left hip. 2. Linear lucency and cortical offset in the inferior aspect of the lateral aspect of the sacrum on the left, possible nondisplaced fracture. MRI is suggested for further evaluation. 3. Soft tissue edema and enlargement of the piriformis and obturator internus muscles on the left. No intramuscular hematoma is identified. 4. Left inguinal hernia containing nonobstructed bowel. Electronically Signed   By: Thornell Sartorius M.D.   On: 08/15/2021 00:24   ECHOCARDIOGRAM COMPLETE  Result Date: 08/15/2021    ECHOCARDIOGRAM REPORT   Patient Name:   LASHEKA KEMPNER Date of Exam: 08/15/2021 Medical Rec #:  [id number]       Height:       67.0 in Accession #:    [id number]      Weight:       130.0 lb Date of Birth:  1933-05-21  BSA:          1.684 m Patient Age:    88 years        BP:           148/90 mmHg Patient Gender: F               HR:           71 bpm. Exam Location:  Inpatient Procedure: 2D Echo, Cardiac Doppler and Color Doppler Indications:    Stroke I63.9  History:        Patient has no prior history of Echocardiogram examinations.                 Stroke; Risk Factors:Hypertension.  Sonographer:    Eulah Pont RDCS Referring Phys: [id number] Teddy Spike IMPRESSIONS  1. Left ventricular ejection fraction, by estimation, is 55 to 60%. Left ventricular ejection fraction by 2D MOD biplane is 59.3 %. The left ventricle has normal function. The left ventricle has no regional wall motion abnormalities. Left ventricular diastolic parameters are consistent with Grade I diastolic dysfunction (impaired relaxation).  2. Right ventricular systolic function is normal. The right ventricular size is normal. There is normal pulmonary artery systolic pressure. The estimated right ventricular systolic pressure is 25.5 mmHg.  3. The mitral valve is grossly normal. Trivial mitral valve regurgitation.  4. The aortic valve is tricuspid. Aortic valve regurgitation is  not visualized.  5. The inferior vena cava is normal in size with greater than 50% respiratory variability, suggesting right atrial pressure of 3 mmHg. Comparison(s): No prior Echocardiogram. FINDINGS  Left Ventricle: Left ventricular ejection fraction, by estimation, is 55 to 60%. Left ventricular ejection fraction by 2D MOD biplane is 59.3 %. The left ventricle has normal function. The left ventricle has no regional wall motion abnormalities. The left ventricular internal cavity size was normal in size. There is no left ventricular hypertrophy. Left ventricular diastolic parameters are consistent with Grade I diastolic dysfunction (impaired relaxation). Indeterminate filling pressures. Right Ventricle: The right ventricular size is normal. No increase in right ventricular wall thickness. Right ventricular systolic function is normal. There is normal pulmonary artery systolic pressure. The tricuspid regurgitant velocity is 2.37 m/s, and  with an assumed right atrial pressure of 3 mmHg, the estimated right ventricular systolic pressure is 25.5 mmHg. Left Atrium: Left atrial size was normal in size. Right Atrium: Right atrial size was normal in size. Pericardium: There is no evidence of pericardial effusion. Mitral Valve: The mitral valve is grossly normal. Trivial mitral valve regurgitation. Tricuspid Valve: The tricuspid valve is grossly normal. Tricuspid valve regurgitation is mild. Aortic Valve: The aortic valve is tricuspid. Aortic valve regurgitation is not visualized. Pulmonic Valve: The pulmonic valve was normal in structure. Pulmonic valve regurgitation is not visualized. Aorta: The aortic root and ascending aorta are structurally normal, with no evidence of dilitation. Venous: The inferior vena cava is normal in size with greater than 50% respiratory variability, suggesting right atrial pressure of 3 mmHg. IAS/Shunts: No atrial level shunt detected by color flow Doppler.  LEFT VENTRICLE PLAX 2D                         Biplane EF (MOD) LVIDd:         4.30 cm         LV Biplane EF:   Left LVIDs:         3.10 cm  ventricular LV PW:         0.90 cm                          ejection LV IVS:        0.70 cm                          fraction by LVOT diam:     2.10 cm                          2D MOD LV SV:         62                               biplane is LV SV Index:   37                               59.3 %. LVOT Area:     3.46 cm                                Diastology                                LV e' medial:    5.48 cm/s LV Volumes (MOD)               LV E/e' medial:  9.7 LV vol d, MOD    56.8 ml       LV e' lateral:   7.20 cm/s A2C:                           LV E/e' lateral: 7.4 LV vol d, MOD    68.8 ml A4C: LV vol s, MOD    23.1 ml A2C: LV vol s, MOD    28.6 ml A4C: LV SV MOD A2C:   33.7 ml LV SV MOD A4C:   68.8 ml LV SV MOD BP:    37.8 ml RIGHT VENTRICLE RV S prime:     11.30 cm/s TAPSE (M-mode): 2.0 cm LEFT ATRIUM             Index        RIGHT ATRIUM           Index LA diam:        3.00 cm 1.78 cm/m   RA Area:     13.70 cm LA Vol (A2C):   45.9 ml 27.26 ml/m  RA Volume:   30.70 ml  18.23 ml/m LA Vol (A4C):   34.0 ml 20.19 ml/m LA Biplane Vol: 41.4 ml 24.59 ml/m  AORTIC VALVE LVOT Vmax:   82.50 cm/s LVOT Vmean:  53.000 cm/s LVOT VTI:    0.180 m  AORTA Ao Root diam: 3.60 cm Ao Asc diam:  3.60 cm MITRAL VALVE               TRICUSPID VALVE MV Area (PHT): 1.91 cm    TR Peak grad:   22.5 mmHg MV Decel Time: 398 msec    TR Vmax:        237.00 cm/s MV E velocity: 53.30  cm/s MV A velocity: 44.80 cm/s  SHUNTS MV E/A ratio:  1.19        Systemic VTI:  0.18 m                            Systemic Diam: 2.10 cm Zoila Shutter MD Electronically signed by Zoila Shutter MD Signature Date/Time: 08/15/2021/4:38:01 PM    Final    DG Hip Unilat W or Wo Pelvis 2-3 Views Left  Result Date: 08/14/2021 CLINICAL DATA:  Fall, left hip pain EXAM: DG HIP (WITH OR WITHOUT PELVIS) 2-3V LEFT COMPARISON:  None  Available. FINDINGS: Normal alignment. No acute fracture or dislocation. Mild left hip degenerative arthritis. Lumbar fusion hardware partially visualized. Foley catheter overlies the expected bladder lumen. Soft tissues are otherwise unremarkable. IMPRESSION: No acute abnormality Electronically Signed   By: Helyn Numbers M.D.   On: 08/14/2021 21:06   CT ANGIO HEAD NECK W WO CM (CODE STROKE)  Result Date: 08/15/2021 CLINICAL DATA:  Stroke, follow-up. EXAM: CT ANGIOGRAPHY HEAD AND NECK TECHNIQUE: Multidetector CT imaging of the head and neck was performed using the standard protocol during bolus administration of intravenous contrast. Multiplanar CT image reconstructions and MIPs were obtained to evaluate the vascular anatomy. Carotid stenosis measurements (when applicable) are obtained utilizing NASCET criteria, using the distal internal carotid diameter as the denominator. RADIATION DOSE REDUCTION: This exam was performed according to the departmental dose-optimization program which includes automated exposure control, adjustment of the mA and/or kV according to patient size and/or use of iterative reconstruction technique. CONTRAST:  75mL OMNIPAQUE IOHEXOL 350 MG/ML SOLN COMPARISON:  Brain MRI 08/15/2021. FINDINGS: CT HEAD FINDINGS Brain: Mild generalized cerebral atrophy. Redemonstrated 16 mm acute infarct within the left corona radiata. Background moderate patchy and ill-defined hypoattenuation within the cerebral white matter, nonspecific but compatible with chronic small vessel ischemic disease. There is no acute intracranial hemorrhage. No extra-axial fluid collection. No evidence of an intracranial mass. No midline shift. Vascular: No hyperdense vessel.  Atherosclerotic calcifications. Skull: No fracture or aggressive osseous lesion. Sinuses: Trace mucosal thickening scattered within the bilateral ethmoid sinuses. Orbits: No orbital mass or acute orbital finding. Review of the MIP images confirms the  above findings CTA NECK FINDINGS Aortic arch: Common origin of the innominate and left common carotid arteries. The visualized aortic arch is normal in caliber. No hemodynamically significant innominate or proximal subclavian artery stenosis. Right carotid system: CCA and ICA patent within the neck without stenosis. No significant atherosclerotic disease. Left carotid system: CCA and ICA patent within the neck without stenosis. No significant atherosclerotic disease. Vertebral arteries: Vertebral arteries patent within the neck without hemodynamically significant stenosis. Skeleton: Cervical dextrocurvature. Cervical spondylosis. Disc space narrowing is advanced at C5-C6, C6-C7 and C7-T1. No appreciable high-grade spinal canal stenosis. Multilevel bony neural foraminal narrowing. No acute fracture or aggressive osseous lesion. Other neck: Nonspecific 10 mm cutaneous/subcutaneous lesion within the left perimandibular soft tissues (series 13, image 237). Upper chest: No consolidation within the imaged lung apices. Review of the MIP images confirms the above findings CTA HEAD FINDINGS Anterior circulation: The intracranial internal carotid arteries are patent. The M1 middle cerebral arteries are patent. No M2 proximal branch occlusion or high-grade proximal stenosis. The anterior cerebral arteries are patent. The right A1 segment is developmentally hypoplastic. 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 1-2 mm inferiorly projecting vascular protrusion arising  from the supraclinoid left ICA, which may reflect an infundibulum or aneurysm. Posterior circulation: The intracranial vertebral arteries are patent. The basilar artery is patent. The posterior cerebral arteries are patent. The right PCA is fetal in origin. The left posterior communicating artery is diminutive or absent. Mild focal stenosis within the left PCA at the  P1/P2 junction. Venous sinuses: Within the limitations of contrast timing, no convincing thrombus. Anatomic variants: As described Review of the MIP images confirms the above findings IMPRESSION: CT head: 1. Redemonstrated 16 mm acute infarct within the left corona radiata. 2. Background moderate chronic small vessel ischemic changes within the cerebral white matter. 3. Mild generalized cerebral atrophy. CTA neck: 1. The common carotid, internal carotid and vertebral arteries are patent within the neck without stenosis or significant atherosclerotic disease. 2. Cervical spondylosis and cervical dextrocurvature. 3. Nonspecific 10 mm cutaneous/subcutaneous lesion within the left perimandibular soft tissues. Direct visualization recommended. CTA head: 1. No intracranial large vessel occlusion is identified. 2. 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 3. Mild focal stenosis within the left PCA at the P1/P2 junction. Electronically Signed   By: Jackey Loge D.O.   On: 08/15/2021 13:30     LOS: 1 day   Lanae Boast, MD [facility]  08/16/2021, 11:12 AM

## 2021-08-16 NOTE — Progress Notes (Signed)
PT Cancellation Note  Patient Details Name: Alexza Kina MRN: [id number] DOB: July 13, 1933   Cancelled Treatment:    Reason Eval/Treat Not Completed: Medical issues which prohibited therapy (Await clarification from trauma in regards to precautions due to sacral fracture prior to mobilizing pt.)   Alvira Philips 08/16/2021, 8:57 AM Petra Sargeant M,PT Acute Rehab Services [phone] (pager)

## 2021-08-16 NOTE — Evaluation (Signed)
Clinical/Bedside Swallow Evaluation Patient Details  Name: Marie Mitchell MRN: [id number] Date of Birth: 1933/05/20  Today's Date: 08/16/2021 Time: SLP Start Time (ACUTE ONLY): 7494 SLP Stop Time (ACUTE ONLY): 0845 SLP Time Calculation (min) (ACUTE ONLY): 17 min  Past Medical History: History reviewed. No pertinent past medical history. Past Surgical History:  The histories are not reviewed yet. Please review them in the "History" navigator section and refresh this SmartLink. HPI:  Marie Mitchell is a 88 y.o. female with a past medical history significant for recent kyphoplasty, frequent falls, chronic Foley catheter who presented after another fall, and was found to have a sacral fracture. Per chart she was noted to have some difficulty drinking water and slurred speech for which MRI brain was obtained which demonstrated an acute infarct in left basal ganglia extending into overlying corona radiata.    Assessment / Plan / Recommendation  Clinical Impression  Pt exhibits a low vocal intensity suspecting may be her baseline with mild-moderate dysarthric speech. Her oral-motor, volitional cough and swallow is functional. Cup drinking resulted in mild anterior spill due to decreased labial seal. She needed mild feeding assist to coordinate spoon to mouth and no s/s aspiration with puree or solid texture. She took multiple sips with straw with one immediate cough mitigated by single or 2 sips at a time. Recommend she have feeding assist when needed for regular texture, thin liquids, pills whole in puree and small sips. ST will briefly follow for swallow. SLP Visit Diagnosis: Dysphagia, unspecified (R13.10)    Aspiration Risk  Mild aspiration risk    Diet Recommendation Regular;Thin liquid   Liquid Administration via: Straw;Cup Medication Administration: Whole meds with puree Supervision: Staff to assist with self feeding;Intermittent supervision to cue for compensatory  strategies Compensations: Small sips/bites;Slow rate;Minimize environmental distractions Postural Changes: Seated upright at 90 degrees    Other  Recommendations Oral Care Recommendations: Oral care BID    Recommendations for follow up therapy are one component of a multi-disciplinary discharge planning process, led by the attending physician.  Recommendations may be updated based on patient status, additional functional criteria and insurance authorization.  Follow up Recommendations No SLP follow up      Assistance Recommended at Discharge Intermittent Supervision/Assistance  Functional Status Assessment Patient has had a recent decline in their functional status and demonstrates the ability to make significant improvements in function in a reasonable and predictable amount of time.  Frequency and Duration min 2x/week  2 weeks       Prognosis Prognosis for Safe Diet Advancement: Good Barriers to Reach Goals: Cognitive deficits      Swallow Study   General Date of Onset: 08/14/21 HPI: Marie Mitchell is a 88 y.o. female with a past medical history significant for recent kyphoplasty, frequent falls, chronic Foley catheter who presented after another fall, and was found to have a sacral fracture. Per chart she was noted to have some difficulty drinking water and slurred speech for which MRI brain was obtained which demonstrated an acute infarct in left basal ganglia extending into overlying corona radiata. Type of Study: Bedside Swallow Evaluation Previous Swallow Assessment:  (none) Diet Prior to this Study: NPO Temperature Spikes Noted: No Respiratory Status: Room air History of Recent Intubation: No Behavior/Cognition: Alert;Cooperative;Pleasant mood;Requires cueing Oral Cavity Assessment: Within Functional Limits Oral Care Completed by SLP: No Oral Cavity - Dentition: Adequate natural dentition Vision: Functional for self-feeding Self-Feeding Abilities: Needs assist Patient  Positioning: Upright in bed Baseline Vocal Quality: Normal Volitional Cough: Strong Volitional  Swallow: Able to elicit    Oral/Motor/Sensory Function Overall Oral Motor/Sensory Function: Within functional limits   Ice Chips Ice chips: Not tested   Thin Liquid Thin Liquid: Impaired Oral Phase Impairments: Reduced labial seal Oral Phase Functional Implications: Other (comment) (anterior spill) Pharyngeal  Phase Impairments: Cough - Immediate    Nectar Thick Nectar Thick Liquid: Not tested   Honey Thick Honey Thick Liquid: Not tested   Puree Puree: Within functional limits   Solid     Solid: Within functional limits      Royce Macadamia 08/16/2021,9:13 AM

## 2021-08-17 ENCOUNTER — Inpatient Hospital Stay (HOSPITAL_COMMUNITY)
Admission: EM | Admit: 2021-08-17 | Discharge: 2021-08-29 | DRG: 057 | Disposition: A | Discharge status: Skilled Nursing Facility | Payer: Medicare Other | Source: Intra-hospital | Attending: Physical Medicine and Rehabilitation | Admitting: Physical Medicine and Rehabilitation

## 2021-08-17 ENCOUNTER — Other Ambulatory Visit: Payer: Self-pay

## 2021-08-17 ENCOUNTER — Encounter (HOSPITAL_COMMUNITY): Payer: Self-pay | Admitting: Physical Medicine and Rehabilitation

## 2021-08-17 DIAGNOSIS — G4700 Insomnia, unspecified: Secondary | ICD-10-CM | POA: Diagnosis present

## 2021-08-17 DIAGNOSIS — Z79899 Other long term (current) drug therapy: Secondary | ICD-10-CM

## 2021-08-17 DIAGNOSIS — M810 Age-related osteoporosis without current pathological fracture: Secondary | ICD-10-CM | POA: Diagnosis present

## 2021-08-17 DIAGNOSIS — K5909 Other constipation: Secondary | ICD-10-CM | POA: Diagnosis present

## 2021-08-17 DIAGNOSIS — F32A Depression, unspecified: Secondary | ICD-10-CM | POA: Diagnosis present

## 2021-08-17 DIAGNOSIS — K5900 Constipation, unspecified: Secondary | ICD-10-CM | POA: Diagnosis present

## 2021-08-17 DIAGNOSIS — I959 Hypotension, unspecified: Secondary | ICD-10-CM | POA: Diagnosis not present

## 2021-08-17 DIAGNOSIS — S3210XA Unspecified fracture of sacrum, initial encounter for closed fracture: Secondary | ICD-10-CM | POA: Diagnosis not present

## 2021-08-17 DIAGNOSIS — R296 Repeated falls: Secondary | ICD-10-CM | POA: Diagnosis present

## 2021-08-17 DIAGNOSIS — S3219XD Other fracture of sacrum, subsequent encounter for fracture with routine healing: Secondary | ICD-10-CM

## 2021-08-17 DIAGNOSIS — I69322 Dysarthria following cerebral infarction: Secondary | ICD-10-CM

## 2021-08-17 DIAGNOSIS — I10 Essential (primary) hypertension: Secondary | ICD-10-CM | POA: Diagnosis present

## 2021-08-17 DIAGNOSIS — R339 Retention of urine, unspecified: Secondary | ICD-10-CM | POA: Diagnosis present

## 2021-08-17 DIAGNOSIS — E785 Hyperlipidemia, unspecified: Secondary | ICD-10-CM | POA: Diagnosis present

## 2021-08-17 DIAGNOSIS — I639 Cerebral infarction, unspecified: Secondary | ICD-10-CM | POA: Diagnosis not present

## 2021-08-17 DIAGNOSIS — E559 Vitamin D deficiency, unspecified: Secondary | ICD-10-CM | POA: Diagnosis present

## 2021-08-17 DIAGNOSIS — W1839XD Other fall on same level, subsequent encounter: Secondary | ICD-10-CM | POA: Diagnosis not present

## 2021-08-17 DIAGNOSIS — F419 Anxiety disorder, unspecified: Secondary | ICD-10-CM

## 2021-08-17 DIAGNOSIS — Z978 Presence of other specified devices: Secondary | ICD-10-CM

## 2021-08-17 DIAGNOSIS — R7989 Other specified abnormal findings of blood chemistry: Secondary | ICD-10-CM | POA: Diagnosis not present

## 2021-08-17 DIAGNOSIS — N3289 Other specified disorders of bladder: Secondary | ICD-10-CM | POA: Diagnosis present

## 2021-08-17 DIAGNOSIS — R001 Bradycardia, unspecified: Secondary | ICD-10-CM | POA: Diagnosis not present

## 2021-08-17 DIAGNOSIS — R52 Pain, unspecified: Secondary | ICD-10-CM | POA: Diagnosis not present

## 2021-08-17 DIAGNOSIS — I69351 Hemiplegia and hemiparesis following cerebral infarction affecting right dominant side: Principal | ICD-10-CM

## 2021-08-17 DIAGNOSIS — I69398 Other sequelae of cerebral infarction: Secondary | ICD-10-CM | POA: Diagnosis present

## 2021-08-17 MED ORDER — ATORVASTATIN CALCIUM 40 MG PO TABS
40.0000 mg | ORAL_TABLET | Freq: Every day | ORAL | Status: DC
Start: 2021-08-18 — End: 2021-08-29
  Administered 2021-08-18 – 2021-08-29 (×12): 40 mg via ORAL
  Filled 2021-08-17 (×12): qty 1

## 2021-08-17 MED ORDER — ATORVASTATIN CALCIUM 40 MG PO TABS: 40.0000 mg | ORAL_TABLET | Freq: Every day | ORAL | Status: AC

## 2021-08-17 MED ORDER — OYSTER SHELL CALCIUM/D3 500-5 MG-MCG PO TABS
2.0000 | ORAL_TABLET | Freq: Every day | ORAL | Status: DC
  Administered 2021-08-18 – 2021-08-29 (×12): 2 via ORAL
  Filled 2021-08-17 (×12): qty 2

## 2021-08-17 MED ORDER — VITAMIN D3 25 MCG (1000 UNIT) PO TABS
1000.0000 [IU] | ORAL_TABLET | Freq: Every day | ORAL | Status: DC
  Administered 2021-08-17 – 2021-08-28 (×12): 1000 [IU] via ORAL
  Filled 2021-08-17 (×25): qty 1

## 2021-08-17 MED ORDER — CLOPIDOGREL BISULFATE 75 MG PO TABS
75.0000 mg | ORAL_TABLET | Freq: Every day | ORAL | Status: DC
Start: 2021-08-18 — End: 2021-08-29
  Administered 2021-08-18 – 2021-08-29 (×12): 75 mg via ORAL
  Filled 2021-08-17 (×12): qty 1

## 2021-08-17 MED ORDER — BISACODYL 10 MG RE SUPP: 10.0000 mg | Freq: Every day | RECTAL | Status: DC | PRN

## 2021-08-17 MED ORDER — AMLODIPINE BESYLATE 5 MG PO TABS
5.0000 mg | ORAL_TABLET | Freq: Every day | ORAL | Status: DC
  Administered 2021-08-18 – 2021-08-23 (×6): 5 mg via ORAL
  Filled 2021-08-17 (×7): qty 1

## 2021-08-17 MED ORDER — POLYETHYLENE GLYCOL 3350 17 G PO PACK
17.0000 g | PACK | ORAL | Status: DC
  Administered 2021-08-17: 17 g via ORAL
  Filled 2021-08-17: qty 1

## 2021-08-17 MED ORDER — ASPIRIN 81 MG PO TBEC
81.0000 mg | DELAYED_RELEASE_TABLET | Freq: Every day | ORAL | Status: DC
  Administered 2021-08-18 – 2021-08-29 (×12): 81 mg via ORAL
  Filled 2021-08-17 (×12): qty 1

## 2021-08-17 MED ORDER — ACETAMINOPHEN 325 MG PO TABS
325.0000 mg | ORAL_TABLET | Freq: Four times a day (QID) | ORAL | Status: DC
  Administered 2021-08-17 – 2021-08-19 (×8): 325 mg via ORAL
  Filled 2021-08-17 (×8): qty 1

## 2021-08-17 MED ORDER — FLUOXETINE HCL 20 MG PO CAPS
30.0000 mg | ORAL_CAPSULE | Freq: Every day | ORAL | Status: DC
  Administered 2021-08-18 – 2021-08-29 (×12): 30 mg via ORAL
  Filled 2021-08-17 (×12): qty 1

## 2021-08-17 MED ORDER — ACETAMINOPHEN 325 MG PO TABS
650.0000 mg | ORAL_TABLET | ORAL | Status: DC | PRN
  Administered 2021-08-20: 650 mg via ORAL

## 2021-08-17 MED ORDER — OYSTER SHELL CALCIUM/D3 500-5 MG-MCG PO TABS
1.0000 | ORAL_TABLET | Freq: Every day | ORAL | Status: DC
  Administered 2021-08-17 – 2021-08-28 (×12): 1 via ORAL
  Filled 2021-08-17 (×12): qty 1

## 2021-08-17 MED ORDER — ENOXAPARIN SODIUM 40 MG/0.4ML IJ SOSY
40.0000 mg | PREFILLED_SYRINGE | INTRAMUSCULAR | Status: DC
  Administered 2021-08-18 – 2021-08-29 (×12): 40 mg via SUBCUTANEOUS
  Filled 2021-08-17 (×12): qty 0.4

## 2021-08-17 MED ORDER — CLOPIDOGREL BISULFATE 75 MG PO TABS: 75.0000 mg | ORAL_TABLET | Freq: Every day | ORAL | 0 refills | Status: DC

## 2021-08-17 MED ORDER — ASPIRIN 81 MG PO TBEC: 81.0000 mg | DELAYED_RELEASE_TABLET | Freq: Every day | ORAL | 12 refills | Status: AC

## 2021-08-17 MED ORDER — AMLODIPINE BESYLATE 5 MG PO TABS: 5.0000 mg | ORAL_TABLET | Freq: Every day | ORAL | Status: DC

## 2021-08-17 MED ORDER — POLYETHYLENE GLYCOL 3350 17 G PO PACK
17.0000 g | PACK | Freq: Every day | ORAL | Status: DC | PRN
Start: 2021-08-17 — End: 2021-08-17

## 2021-08-17 MED ORDER — CHLORHEXIDINE GLUCONATE CLOTH 2 % EX PADS
6.0000 | MEDICATED_PAD | Freq: Every day | CUTANEOUS | Status: DC
  Administered 2021-08-18 – 2021-08-23 (×6): 6 via TOPICAL

## 2021-08-17 MED ORDER — ACETAMINOPHEN 160 MG/5ML PO SOLN: 650.0000 mg | ORAL | Status: DC | PRN

## 2021-08-17 MED ORDER — SENNOSIDES-DOCUSATE SODIUM 8.6-50 MG PO TABS: 1.0000 | ORAL_TABLET | Freq: Every evening | ORAL | Status: DC | PRN

## 2021-08-17 MED ORDER — ACETAMINOPHEN 650 MG RE SUPP: 650.0000 mg | RECTAL | Status: DC | PRN

## 2021-08-17 NOTE — Progress Notes (Signed)
Patient ID: Marie Mitchell, female   DOB: [birth year], 88 y.o.   MRN: [id number] INPATIENT REHABILITATION ADMISSION NOTE   Arrival Method: wheelchair      Mental Orientation: A&O x4    Assessment: completed per flowsheet    Skin:   IV'S: one 20G IV in LFA   Pain:0/10   Tubes and Drains: chronic foley    Safety Measures: 3 side rails up and    Vital Signs: completed per flowsheets   Height and Weight: 5'7" 61.1 KG    Rehab Orientation: completed   Family: notified     Notes:

## 2021-08-17 NOTE — H&P (Signed)
Physical Medicine and Rehabilitation Admission H&P  CC: weakness  Marie Mitchell is an 88 y.o. female.   HPI: Patient is an 88 yo R handed female with past medical history significant for osteoporosis, vitamin d deficiency, frequent falls, chronic Foley, and anxiety. Presented this admission s/p fall at home on 08/12/21, which happened after closing her blinds at home.  Patient is unsure if there was a LOC or head injury.  Initially she did not go to the ED.  However, in the afternoon on 08/13/21 she became increasingly confused with difficulty with seeing and writing. Upon going to the kitchen to prepare dinner her legs became weak and she fell, which resulted in LOC and head injury.  She then went to the ED with her daughter, at which time she had slurred speech and difficulty drinking water without chocking.  MRI brain brain shows acute perforator infarct in the left basal ganglia, extending into overlying corona radiata, with slight edema without mass effect. CT head shows chronic atrophy and small vessel ischemic changes, with no acute intracranial abnormalities.  CTA head negative for large vessel occlusion. 2D echo, normal EF 55-60%. CT left hip was negative for acute hip fracture. Linear lucency and cortical offset in the inferior aspect of the lateral aspect of the sacrum on the left, possible nondisplaced fracture. MR sacrum demonstrated acute nondisplaced fracture of the inferior aspect of the left sacral ala extending into the S3 vertebral body with associated marrow edema and adjacent soft tissue edema.  Patient's LDL 119; HbA1c 5.3; and EF 55-60%; also NIHSS 5   Her Foley was changed in the ED.  Patient needs to be admitted to CIR for acute decline in level of function: mobility, ADL's, and cognition.  According to her daughter, had Botox of bladder recently. Uses chronic foley for complex bladder issues- is a massive under reporter and will never say she's hurting- suggested either  scheduling tylenol, or that doesn't work, putting her on Tramadol, although it makes her constipated.     Review of Systems  Unable to perform ROS: Acuity of condition  Constitutional:  Positive for fatigue.  History reviewed. No pertinent past medical history.  No family history on file. Social History:  reports that she has never smoked. She has never used smokeless tobacco. She reports current alcohol use. No history on file for drug use. Allergies:  Allergies  Allergen Reactions   Bacitracin     Not on MAR   Medications Prior to Admission  Medication Sig Dispense Refill   acetaminophen (TYLENOL) 500 MG tablet Take 500 mg by mouth every 6 (six) hours as needed (for pain).     calcium carbonate (OSCAL) 1500 (600 Ca) MG TABS tablet Take 600-1,200 mg by mouth See admin instructions. Give 2 tablets by mouth in the morning then give 1 tablet every evening     Cholecalciferol 25 MCG (1000 UT) tablet Take 1,000 Units by mouth at bedtime.     FLUoxetine (PROZAC) 20 MG tablet Take 30 mg by mouth daily.     polyethylene glycol (MIRALAX / GLYCOLAX) 17 g packet Take 17 g by mouth daily as needed (for constipation).     Turmeric 500 MG TABS Take 500 mg by mouth 2 (two) times daily.     vitamin B-12 (CYANOCOBALAMIN) 1000 MCG tablet Take 1,000 mcg by mouth daily.      Drug Regimen Review  Drug regimen was reviewed and remains appropriate with no significant issues identified   Home: Home Living  Family/patient expects to be discharged to:: Assisted living Living Arrangements: Alone, Other (Comment) Available Help at Discharge: Family, Available PRN/intermittently, Skilled Nursing Facility Type of Home: Assisted living Home Access: Level entry Home Layout: One level Bathroom Shower/Tub: Health visitorWalk-in shower Bathroom Toilet: Handicapped height Bathroom Accessibility: Yes Home Equipment: Agricultural consultantolling Walker (2 wheels), Grab bars - toilet, Grab bars - tub/shower, Hand held shower head, Rollator (4  wheels) Additional Comments: Uses 4WRW to walk to common dinning room.  Lives With: Other (Comment)   Functional History: Prior Function Prior Level of Function : Needs assist, History of Falls (last six months)  Functional Status:  Mobility: Bed Mobility Overal bed mobility: Needs Assistance Bed Mobility: Supine to Sit Supine to sit: Min assist General bed mobility comments: If given time, pt can move to EOB without much assist.  Did assist to get hips to EOB with use of pad once pt was sitting with LEs off bed. Transfers Overall transfer level: Needs assistance Equipment used: Rolling walker (2 wheels) Transfers: Sit to/from Stand, Bed to chair/wheelchair/BSC Sit to Stand: Mod assist, Max assist, From elevated surface Bed to/from chair/wheelchair/BSC transfer type:: Step pivot Stand pivot transfers: Mod assist Step pivot transfers: Mod assist, Max assist General transfer comment: Mod to max A to power up into standing and weight shift forward.  Pt with forward lean onto RW and hips shifted to her right side. Pt had diffifulty with anterior weight shift and keeps buttocks posterior to body. Also with narrow BOS and would not widen it even wtih commands.  She was able to step around to the chair with mod to max assist.      ADL: ADL Overall ADL's : Needs assistance/impaired Eating/Feeding: NPO Grooming: Wash/dry hands, Wash/dry face, Min guard, Sitting Upper Body Bathing: Minimal assistance, Sitting Lower Body Bathing: Sitting/lateral leans, Maximal assistance Upper Body Dressing : Minimal assistance, Sitting Lower Body Dressing: Maximal assistance, Sit to/from stand Toilet Transfer: Moderate assistance, Stand-pivot, BSC/3in1, Rolling walker (2 wheels) Functional mobility during ADLs: Minimal assistance, Maximal assistance, Rolling walker (2 wheels) (Initially Max A for correcting posterior lean.) General ADL Comments: Pt presenting with decreased coordination, strength,  balance, and processing.  Cognition: Cognition Overall Cognitive Status: Impaired/Different from baseline Orientation Level: Oriented to person, Oriented to place, Oriented to time, Disoriented to situation Cognition Arousal/Alertness: Awake/alert Behavior During Therapy: WFL for tasks assessed/performed Overall Cognitive Status: Impaired/Different from baseline Area of Impairment: Memory, Following commands, Attention, Problem solving Orientation Level: Person, Place, Time Current Attention Level: Selective Memory: Decreased short-term memory Following Commands: Follows one step commands inconsistently Problem Solving: Decreased initiation, Requires verbal cues, Slow processing General Comments: Demonstrating selective attention to perform ADLs in distracting environment. Unable to divide to perform two tasks together.   Blood pressure (!) 145/85, pulse 69, temperature 97.8 F (36.6 C), resp. rate 18, SpO2 98 %. Physical Exam Vitals and nursing note reviewed.  Constitutional:      Comments: Awake, alert, appears frail; very HOH- wearing hearing aids; sitting up in bed; limited historian; hair done; NAD  HENT:     Head: Normocephalic and atraumatic.     Comments: No facial droop seen Tongue midline Wearing hearing aids    Right Ear: External ear normal.     Left Ear: External ear normal.     Nose: Nose normal. No congestion.     Mouth/Throat:     Mouth: Mucous membranes are dry.     Pharynx: Oropharynx is clear. No oropharyngeal exudate.  Eyes:     General:  Right eye: No discharge.        Left eye: No discharge.     Extraocular Movements: Extraocular movements intact.  Cardiovascular:     Rate and Rhythm: Normal rate and regular rhythm.     Heart sounds: Normal heart sounds. No murmur heard.   No gallop.  Pulmonary:     Effort: Pulmonary effort is normal. No respiratory distress.     Breath sounds: Normal breath sounds. No stridor. No wheezing or rales.   Abdominal:     General: Bowel sounds are normal. There is no distension.     Palpations: Abdomen is soft.     Tenderness: There is no abdominal tenderness.  Genitourinary:    Comments: Has foley in place- medium amber urine in bag Musculoskeletal:     Cervical back: Neck supple. No tenderness.     Comments: RUE- deltoid, biceps, triceps 4+/5; otherwise 5-/5 LUE 5/5  RLE- HF 4+/5 and R PF 4+/5; otherwise 5-/5 LLE- 5-/5 throughout  Skin:    General: Skin is warm and dry.     Comments: L forearm IV- a little blood flash in IV- looks OK otherwise  Neurological:     Mental Status: She is alert.     Comments: Limited historian Intact to light touch in all 4 extremities and face Ox2- however got confused on changing foley as well as why here  Psychiatric:     Comments: Flat, vague affect    Results for orders placed or performed during the hospital encounter of 08/14/21 (from the past 48 hour(s))  Hemoglobin A1c     Status: None   Collection Time: 08/15/21 11:38 AM  Result Value Ref Range   Hgb A1c MFr Bld 5.3 4.8 - 5.6 %    Comment: (NOTE) Pre diabetes:          5.7%-6.4%  Diabetes:              >6.4%  Glycemic control for   <7.0% adults with diabetes    Mean Plasma Glucose 105.41 mg/dL    Comment: Performed at [facility], [address] [place], [place] [address]  Lipid panel     Status: Abnormal   Collection Time: 08/16/21  4:12 AM  Result Value Ref Range   Cholesterol 236 (H) 0 - 200 mg/dL   Triglycerides 66 <604 mg/dL   HDL 540 >98 mg/dL   Total CHOL/HDL Ratio 2.3 RATIO   VLDL 13 0 - 40 mg/dL   LDL Cholesterol 119 (H) 0 - 99 mg/dL    Comment:        Total Cholesterol/HDL:CHD Risk Coronary Heart Disease Risk Table                     Men   Women  1/2 Average Risk   3.4   3.3  Average Risk       5.0   4.4  2 X Average Risk   9.6   7.1  3 X Average Risk  23.4   11.0        Use the calculated Patient Ratio above and the CHD Risk Table to determine the  patient's CHD Risk.        ATP III CLASSIFICATION (LDL):  <100     mg/dL   Optimal  147-829  mg/dL   Near or Above                    Optimal  130-159  mg/dL   Borderline  161-096  mg/dL   High  >045     mg/dL   Very High Performed at [facility], [address], [place], [place] [address]    ECHOCARDIOGRAM COMPLETE  Result Date: 08/15/2021    ECHOCARDIOGRAM REPORT   Patient Name:   JORDON BOURQUIN Date of Exam: 08/15/2021 Medical Rec #:  [id number]       Height:       67.0 in Accession #:    [id number]      Weight:       130.0 lb Date of Birth:  February 08, 1934       BSA:          1.684 m Patient Age:    88 years        BP:           148/90 mmHg Patient Gender: F               HR:           71 bpm. Exam Location:  Inpatient Procedure: 2D Echo, Cardiac Doppler and Color Doppler Indications:    Stroke I63.9  History:        Patient has no prior history of Echocardiogram examinations.                 Stroke; Risk Factors:Hypertension.  Sonographer:    Eulah Pont RDCS Referring Phys: [id number] Teddy Spike IMPRESSIONS  1. Left ventricular ejection fraction, by estimation, is 55 to 60%. Left ventricular ejection fraction by 2D MOD biplane is 59.3 %. The left ventricle has normal function. The left ventricle has no regional wall motion abnormalities. Left ventricular diastolic parameters are consistent with Grade I diastolic dysfunction (impaired relaxation).  2. Right ventricular systolic function is normal. The right ventricular size is normal. There is normal pulmonary artery systolic pressure. The estimated right ventricular systolic pressure is 25.5 mmHg.  3. The mitral valve is grossly normal. Trivial mitral valve regurgitation.  4. The aortic valve is tricuspid. Aortic valve regurgitation is not visualized.  5. The inferior vena cava is normal in size with greater than 50% respiratory variability, suggesting right atrial pressure of 3 mmHg. Comparison(s): No prior Echocardiogram. FINDINGS  Left  Ventricle: Left ventricular ejection fraction, by estimation, is 55 to 60%. Left ventricular ejection fraction by 2D MOD biplane is 59.3 %. The left ventricle has normal function. The left ventricle has no regional wall motion abnormalities. The left ventricular internal cavity size was normal in size. There is no left ventricular hypertrophy. Left ventricular diastolic parameters are consistent with Grade I diastolic dysfunction (impaired relaxation). Indeterminate filling pressures. Right Ventricle: The right ventricular size is normal. No increase in right ventricular wall thickness. Right ventricular systolic function is normal. There is normal pulmonary artery systolic pressure. The tricuspid regurgitant velocity is 2.37 m/s, and  with an assumed right atrial pressure of 3 mmHg, the estimated right ventricular systolic pressure is 25.5 mmHg. Left Atrium: Left atrial size was normal in size. Right Atrium: Right atrial size was normal in size. Pericardium: There is no evidence of pericardial effusion. Mitral Valve: The mitral valve is grossly normal. Trivial mitral valve regurgitation. Tricuspid Valve: The tricuspid valve is grossly normal. Tricuspid valve regurgitation is mild. Aortic Valve: The aortic valve is tricuspid. Aortic valve regurgitation is not visualized. Pulmonic Valve: The pulmonic valve was normal in structure. Pulmonic valve regurgitation is not visualized. Aorta: The aortic root and ascending aorta are structurally normal,  with no evidence of dilitation. Venous: The inferior vena cava is normal in size with greater than 50% respiratory variability, suggesting right atrial pressure of 3 mmHg. IAS/Shunts: No atrial level shunt detected by color flow Doppler.  LEFT VENTRICLE PLAX 2D                        Biplane EF (MOD) LVIDd:         4.30 cm         LV Biplane EF:   Left LVIDs:         3.10 cm                          ventricular LV PW:         0.90 cm                          ejection LV IVS:         0.70 cm                          fraction by LVOT diam:     2.10 cm                          2D MOD LV SV:         62                               biplane is LV SV Index:   37                               59.3 %. LVOT Area:     3.46 cm                                Diastology                                LV e' medial:    5.48 cm/s LV Volumes (MOD)               LV E/e' medial:  9.7 LV vol d, MOD    56.8 ml       LV e' lateral:   7.20 cm/s A2C:                           LV E/e' lateral: 7.4 LV vol d, MOD    68.8 ml A4C: LV vol s, MOD    23.1 ml A2C: LV vol s, MOD    28.6 ml A4C: LV SV MOD A2C:   33.7 ml LV SV MOD A4C:   68.8 ml LV SV MOD BP:    37.8 ml RIGHT VENTRICLE RV S prime:     11.30 cm/s TAPSE (M-mode): 2.0 cm LEFT ATRIUM             Index        RIGHT ATRIUM           Index LA diam:        3.00 cm 1.78 cm/m   RA Area:  13.70 cm LA Vol (A2C):   45.9 ml 27.26 ml/m  RA Volume:   30.70 ml  18.23 ml/m LA Vol (A4C):   34.0 ml 20.19 ml/m LA Biplane Vol: 41.4 ml 24.59 ml/m  AORTIC VALVE LVOT Vmax:   82.50 cm/s LVOT Vmean:  53.000 cm/s LVOT VTI:    0.180 m  AORTA Ao Root diam: 3.60 cm Ao Asc diam:  3.60 cm MITRAL VALVE               TRICUSPID VALVE MV Area (PHT): 1.91 cm    TR Peak grad:   22.5 mmHg MV Decel Time: 398 msec    TR Vmax:        237.00 cm/s MV E velocity: 53.30 cm/s MV A velocity: 44.80 cm/s  SHUNTS MV E/A ratio:  1.19        Systemic VTI:  0.18 m                            Systemic Diam: 2.10 cm Zoila Shutter MD Electronically signed by Zoila Shutter MD Signature Date/Time: 08/15/2021/4:38:01 PM    Final    CT ANGIO HEAD NECK W WO CM (CODE STROKE)  Result Date: 08/15/2021 CLINICAL DATA:  Stroke, follow-up. EXAM: CT ANGIOGRAPHY HEAD AND NECK TECHNIQUE: Multidetector CT imaging of the head and neck was performed using the standard protocol during bolus administration of intravenous contrast. Multiplanar CT image reconstructions and MIPs were obtained to evaluate the vascular  anatomy. Carotid stenosis measurements (when applicable) are obtained utilizing NASCET criteria, using the distal internal carotid diameter as the denominator. RADIATION DOSE REDUCTION: This exam was performed according to the departmental dose-optimization program which includes automated exposure control, adjustment of the mA and/or kV according to patient size and/or use of iterative reconstruction technique. CONTRAST:  75mL OMNIPAQUE IOHEXOL 350 MG/ML SOLN COMPARISON:  Brain MRI 08/15/2021. FINDINGS: CT HEAD FINDINGS Brain: Mild generalized cerebral atrophy. Redemonstrated 16 mm acute infarct within the left corona radiata. Background moderate patchy and ill-defined hypoattenuation within the cerebral white matter, nonspecific but compatible with chronic small vessel ischemic disease. There is no acute intracranial hemorrhage. No extra-axial fluid collection. No evidence of an intracranial mass. No midline shift. Vascular: No hyperdense vessel.  Atherosclerotic calcifications. Skull: No fracture or aggressive osseous lesion. Sinuses: Trace mucosal thickening scattered within the bilateral ethmoid sinuses. Orbits: No orbital mass or acute orbital finding. Review of the MIP images confirms the above findings CTA NECK FINDINGS Aortic arch: Common origin of the innominate and left common carotid arteries. The visualized aortic arch is normal in caliber. No hemodynamically significant innominate or proximal subclavian artery stenosis. Right carotid system: CCA and ICA patent within the neck without stenosis. No significant atherosclerotic disease. Left carotid system: CCA and ICA patent within the neck without stenosis. No significant atherosclerotic disease. Vertebral arteries: Vertebral arteries patent within the neck without hemodynamically significant stenosis. Skeleton: Cervical dextrocurvature. Cervical spondylosis. Disc space narrowing is advanced at C5-C6, C6-C7 and C7-T1. No appreciable high-grade spinal  canal stenosis. Multilevel bony neural foraminal narrowing. No acute fracture or aggressive osseous lesion. Other neck: Nonspecific 10 mm cutaneous/subcutaneous lesion within the left perimandibular soft tissues (series 13, image 237). Upper chest: No consolidation within the imaged lung apices. Review of the MIP images confirms the above findings CTA HEAD FINDINGS Anterior circulation: The intracranial internal carotid arteries are patent. The M1 middle cerebral arteries are patent. No M2 proximal branch occlusion or high-grade proximal stenosis.  The anterior cerebral arteries are patent. The right A1 segment is developmentally hypoplastic. 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 1-2 mm inferiorly projecting vascular protrusion arising from the supraclinoid left ICA, which may reflect an infundibulum or aneurysm. Posterior circulation: The intracranial vertebral arteries are patent. The basilar artery is patent. The posterior cerebral arteries are patent. The right PCA is fetal in origin. The left posterior communicating artery is diminutive or absent. Mild focal stenosis within the left PCA at the P1/P2 junction. Venous sinuses: Within the limitations of contrast timing, no convincing thrombus. Anatomic variants: As described Review of the MIP images confirms the above findings IMPRESSION: CT head: 1. Redemonstrated 16 mm acute infarct within the left corona radiata. 2. Background moderate chronic small vessel ischemic changes within the cerebral white matter. 3. Mild generalized cerebral atrophy. CTA neck: 1. The common carotid, internal carotid and vertebral arteries are patent within the neck without stenosis or significant atherosclerotic disease. 2. Cervical spondylosis and cervical dextrocurvature. 3. Nonspecific 10 mm cutaneous/subcutaneous lesion within the left perimandibular soft tissues. Direct  visualization recommended. CTA head: 1. No intracranial large vessel occlusion is identified. 2. 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 3. Mild focal stenosis within the left PCA at the P1/P2 junction. Electronically Signed   By: Jackey Loge D.O.   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 with R hemiparesis ELOS 16-20 days min A Patient able to shower.  2. DVT Prophylaxis/Anticoagulation: Pharmaceutical: Lovenox  On ASA and Plavix- Plavix for 21 days 2. Pain Management: will schedule tylenol 325 mg q6 hours and con't prn as well- might need tramadol, however makes her constipated 3. Mood: Hx of anxiety > depression- will con't Prozac that she's been on for 20 years- might need to increase it and might need to see Neuropsychology.  4. Patient is not able to make decisions for herself due to stroke and per family, recent reduction in mentation.  5. HTN- patient's BP has been running higher- not on BP meds- will monitor for trend 6. HLD- will continue statin that was started per Neurology 7. L sacral ala fracture- is WBAT on B/L LE's, however will treat pain, since tx is conservative. Might require Tramadol.  8. Constipation- is chronic for pt- will schedule Miralax QOD for now; however if add tramadol, will make daily.  9. Chronic foley due to bladder spasms- just changed on day of admission- pt accidentally pulled it out- will try to use leg bag as much as possible.  10.  Recent kyphoplasty 07/08/21- T8/T12/L1 for compression fractures.  11. Osteoporosis/Vit D Deficiency- con't Vit D and monitor for falls- since has hx of frequent falls.  12. Sundowning at home- will monitor for behavior- might need something for sundowning.    Patient Active Problem List   Diagnosis Date Noted   Acute CVA (cerebrovascular accident) (HCC) 08/15/2021   Falls  frequently 08/15/2021   Sacral fracture (HCC) 08/15/2021   HTN (hypertension) 08/15/2021   Anxiety 08/15/2021   Osteoporosis 08/15/2021    I have personally performed a face to face diagnostic evaluation of this patient and formulated the plan.  Additionally, I have personally reviewed laboratory data, imaging studies, as well as relevant notes and concur with the physician assistant's documentation above.   The patient's status has not changed  from the original H&P.  Any changes in documentation from the acute care chart have been noted above.    I spent a total of 1 hour 35 minutes on this admission myself; with review of chart, exam and doing 75% of H&P- Also called pt's daughter Thurston Hole and discussed Geophysical data processor and appropriate clothing for therapy - also personally explained she will likely need SNF for a few weeks after CIR to get back to mod I after d/c.     Tressia Miners 08/17/2021, 10:56 AM

## 2021-08-17 NOTE — Progress Notes (Signed)
PMR Admission Coordinator Pre-Admission Assessment   Patient: Marie Mitchell is an 88 y.o., female MRN: [id number] DOB: Jan 25, 1934 Height:   Weight:     Insurance Information HMO:     PPO:      PCP:      IPA:      80/20: yes     OTHER:  PRIMARY: Medicare Mitchell and B      Policy#: [id number]      Subscriber: Pt  Phone#: Verified online    Fax#:  Pre-Cert#:       Employer:  Benefits:  Phone #:      Name:  Eff. Date: [employer] Mitchell  effective 07/22/00 ad B effective 09/21/2009  Deduct: $1600      Out of Pocket Max:  None      Life Max: N/Mitchell  CIR: 100%      SNF: 100 days Outpatient: 80%     Co-Pay: 20% Home Health: 100%      Co-Pay: none DME: 80%     Co-Pay: 20% Providers: patient's choice  SECONDARY: BCBS Supplement       Policy#: [id number]     Phone#:    Financial Counselor:       Phone#:    The "Data Collection Information Summary" for patients in Inpatient Rehabilitation Facilities with attached "Privacy Act [place] Records" was provided and verbally reviewed with: Pt and Family    Emergency Contact Information Contact Information       Name Relation Home Work [place] Daughter [phone]   [phone]    Marie Mitchell [phone]               Current Medical History  Patient Admitting Diagnosis: CVA, Sacral Fx History of Present Illness: Marie Mitchell is Mitchell 88 y.o. female with medical history significant of osteoporosis, vitamin d deficiency, anxiety. Presenting with fall. 2 days PTA, Pt fell after closing her blinds in her room. Unclear if she had and head injury or LOC. She decided not to come to ED. Pt. Presented to the ED 08/15/21 due to increasing weakness, slurred speech, and choking on water. 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 MRI of the sacrum showed Acute nondisplaced fracture of the inferior aspect of the left sacral ala  extending into the S3 vertebral body with associated marrow edema and adjacent soft tissue edema and Mild muscle edema in the left piriformis muscle likely reflecting reactive edema secondary to the adjacent sacral fracture versus mild muscle strain. Pt. Admitted for stroke workup and PT/OT/SLP saw Pt. With recommendation of CIR to assist return to PLOF.    Complete NIHSS TOTAL: 3   Patient's medical [facility] has been reviewed by the rehabilitation admission coordinator and physician.   Past Medical History  History reviewed. No pertinent past medical history.   Has the patient had major surgery during 100 days prior to admission? Yes   Family History   family history is not on file.   Current Medications   Current Facility-Administered Medications:    acetaminophen (TYLENOL) tablet 650 mg, 650 mg, Oral, Q4H PRN, 650 mg at 08/17/21 0020 **OR** acetaminophen (TYLENOL) 160 MG/5ML solution 650 mg, 650 mg, Per Tube, Q4H PRN **OR** acetaminophen (TYLENOL) suppository 650 mg, 650 mg, Rectal, Q4H PRN, Marie, Tyrone A, DO   amLODipine (NORVASC) tablet 5 mg, 5 mg, Oral, Daily, Mitchell, Ramesh,  MD, 5 mg at 08/17/21 0836   aspirin EC tablet 81 mg, 81 mg, Oral, Daily, Mitchell, Ramesh, MD, 81 mg at 08/17/21 0836   atorvastatin (LIPITOR) tablet 40 mg, 40 mg, Oral, Daily, Marie, Tyrone A, DO, 40 mg at 08/17/21 0836   calcium-vitamin D (OSCAL WITH D) 500-5 MG-MCG per tablet 1 tablet, 1 tablet, Oral, QHS, Shah, Pratik D, DO   calcium-vitamin D (OSCAL WITH D) 500-5 MG-MCG per tablet 2 tablet, 2 tablet, Oral, Q breakfast, Marie Mitchell, Pratik D, DO, 2 tablet at 08/17/21 6433   Chlorhexidine Gluconate Cloth 2 % PADS 6 each, 6 each, Topical, Daily, Heath Lark D, DO, 6 each at 08/16/21 1600   cholecalciferol (VITAMIN D) tablet 1,000 Units, 1,000 Units, Oral, QHS, Marie, Tyrone A, DO, 1,000 Units at 08/16/21 2124   clopidogrel (PLAVIX) tablet 75 mg, 75 mg, Oral, Daily, Marie, Tyrone A, DO, 75 mg at  08/17/21 0836   enoxaparin (LOVENOX) injection 40 mg, 40 mg, Subcutaneous, Q24H, Mitchell, Ramesh, MD, 40 mg at 08/17/21 0836   FLUoxetine (PROZAC) capsule 30 mg, 30 mg, Oral, Daily, Marie, Tyrone A, DO, 30 mg at 08/17/21 0836   senna-docusate (Senokot-S) tablet 1 tablet, 1 tablet, Oral, QHS PRN, Marylyn Ishihara, Tyrone A, DO   Patients Current Diet:  Diet Order                  Diet regular Room service appropriate? Yes; Fluid consistency: Thin  Diet effective now                         Precautions / Restrictions Precautions Precautions: Fall Restrictions Weight Bearing Restrictions: No    Has the patient had 2 or more falls or Mitchell fall with injury in the past year? Yes   Prior Activity Level Limited Community (1-2x/wk): Pt. went out for appts   Prior Functional Level Self Care: Did the patient need help bathing, dressing, using the toilet or eating? Needed some help   Indoor Mobility: Did the patient need assistance with walking from room to room (with or without device)? Independent   Stairs: Did the patient need assistance with internal or external stairs (with or without device)? Needed some help   Functional Cognition: Did the patient need help planning regular tasks such as shopping or remembering to take medications? Needed some help   Patient Information Are you of Hispanic, Latino/Mitchell,or Spanish origin?: Mitchell. No, not of Hispanic, Latino/Mitchell, or Spanish origin What is your race?: Mitchell. White Do you need or want an interpreter to communicate with Mitchell doctor or health care staff?: 0. No   Patient's Response To:  Health Literacy and Transportation Is the patient able to respond to health literacy and transportation needs?: Yes Health Literacy - How often do you need to have someone help you when you read instructions, pamphlets, or other written material from your doctor or pharmacy?: Sometimes In the past 12 months, has lack of transportation kept you from medical appointments or from getting  medications?: No In the past 12 months, has lack of transportation kept you from meetings, work, or from getting things needed for daily living?: No   Home Assistive Devices / Equipment Home Equipment: Conservation officer, nature (2 wheels), Grab bars - toilet, Grab bars - tub/shower, Hand held shower head, Rollator (4 wheels)   Prior Device Use: Indicate devices/aids used by the patient prior to current illness, exacerbation or injury? Walker   Current Functional Level Cognition   Overall Cognitive  Status: Impaired/Different from baseline Current Attention Level: Selective Orientation Level: Oriented to person, Oriented to place, Oriented to time, Disoriented to situation Following Commands: Follows one step commands inconsistently General Comments: Demonstrating selective attention to perform ADLs in distracting environment. Unable to divide to perform two tasks together.    Extremity Assessment (includes Sensation/Coordination)   Upper Extremity Assessment: Defer to OT evaluation RUE Deficits / Details: Decreased pinch/grasp strength and in-hand manipulation as seen during oral care. Decreased coordination RUE Sensation: WNL RUE Coordination: decreased fine motor, decreased gross motor LUE Deficits / Details: decreased strength and coorindation LUE Sensation: WNL LUE Coordination: decreased fine motor, decreased gross motor  Lower Extremity Assessment: RLE deficits/detail, LLE deficits/detail RLE Deficits / Details: grossly 3/5 RLE Coordination: decreased gross motor LLE Deficits / Details: grossly 3/5 LLE Coordination: decreased gross motor     ADLs   Overall ADL's : Needs assistance/impaired Eating/Feeding: NPO Grooming: Wash/dry hands, Wash/dry face, Min guard, Sitting Upper Body Bathing: Minimal assistance, Sitting Lower Body Bathing: Sitting/lateral leans, Maximal assistance Upper Body Dressing : Minimal assistance, Sitting Lower Body Dressing: Maximal assistance, Sit to/from  stand Toilet Transfer: Moderate assistance, Stand-pivot, BSC/3in1, Rolling walker (2 wheels) Functional mobility during ADLs: Minimal assistance, Maximal assistance, Rolling walker (2 wheels) (Initially Max Mitchell for correcting posterior lean.) General ADL Comments: Pt presenting with decreased coordination, strength, balance, and processing.     Mobility   Overal bed mobility: Needs Assistance Bed Mobility: Supine to Sit Supine to sit: Min assist General bed mobility comments: If given time, pt can move to EOB without much assist.  Did assist to get hips to EOB with use of pad once pt was sitting with LEs off bed.     Transfers   Overall transfer level: Needs assistance Equipment used: Rolling walker (2 wheels) Transfers: Sit to/from Stand, Bed to chair/wheelchair/BSC Sit to Stand: Mod assist, Max assist, From elevated surface Bed to/from chair/wheelchair/BSC transfer type:: Step pivot Stand pivot transfers: Mod assist Step pivot transfers: Mod assist, Max assist General transfer comment: Mod to max Mitchell to power up into standing and weight shift forward.  Pt with forward lean onto RW and hips shifted to her right side. Pt had diffifulty with anterior weight shift and keeps buttocks posterior to body. Also with narrow BOS and would not widen it even wtih commands.  She was able to step around to the chair with mod to max assist.     Ambulation / Gait / Stairs / Wheelchair Mobility         Posture / Balance Dynamic Sitting Balance Sitting balance - Comments: Can sit EOB with supervision Balance Overall balance assessment: Needs assistance Sitting-balance support: Feet supported, Bilateral upper extremity supported Sitting balance-Leahy Scale: Fair Sitting balance - Comments: Can sit EOB with supervision Postural control: Posterior lean Standing balance support: Reliant on assistive device for balance, During functional activity, Bilateral upper extremity supported, No upper extremity  supported Standing balance-Leahy Scale: Poor Standing balance comment: Heavy posterior lean. Able to achieve better posture with cues, assist and weight shifting manually by therapist.     Special needs/care consideration Skin intact    Previous Home Environment (from acute therapy documentation) Living Arrangements: Alone, Other (Comment)  Lives With: Other (Comment) Available Help at Discharge: Family, Available PRN/intermittently, Skilled Nursing Facility Type of Home: [place] Name: [place]: One level Home Access: Level entry Bathroom Shower/Tub: Holiday representative Toilet: Handicapped height Bathroom Accessibility: Yes How Accessible: Accessible via wheelchair, Accessible via  walker Home Care Services: No Additional Comments: Uses 4WR[employer] to walk to common dinning room.   Discharge Living Setting Plans for Discharge Living Setting: Other (Comment), Apartment Type of Home at Discharge: [place] Name at Discharge: Alfredo Bach Discharge Home Layout: One level Discharge Home Access: Level entry Discharge Bathroom Shower/Tub: Walk-in shower Discharge Bathroom Toilet: Handicapped height Discharge Bathroom Accessibility: Yes How Accessible: Accessible via walker Does the patient have any problems obtaining your medications?: No   Social/Family/Support Systems Patient Roles: Other (Comment) Contact Information: [phone] Anticipated Caregiver: Lelon Frohlich (daughter) Anticipated Caregiver's Contact Information: [phone] Ability/Limitations of Caregiver: Can do min Mitchell intermittently Caregiver Availability: Intermittent Discharge Plan Discussed with Primary Caregiver: Yes Is Caregiver In Agreement with Plan?: Yes Does Caregiver/Family have Issues with Lodging/Transportation while Pt is in Rehab?: No   Goals Patient/Family Goal for Rehab: PT/OT/SLP Min Mitchell Expected length of stay: 16-18 days Pt/Family Agrees to  Admission and willing to participate: Yes Program Orientation Provided & Reviewed with Pt/Caregiver Including Roles  & Responsibilities: Yes   Decrease burden of Care through IP rehab admission: Specialzed equipment needs, Decrease number of caregivers, Bowel and bladder program, and Patient/family education   Possible need for SNF placement upon discharge: Pt. May d/c to SNF if ALF unable to accommodate and family elects not to take her home. She is amenable to this    Patient Condition: I have reviewed medical records from [facility] , spoken with CM, and patient. I met with patient at the bedside for inpatient rehabilitation assessment.  Patient will benefit from ongoing PT, OT, and SLP, can actively participate in 3 hours of therapy Mitchell day 5 days of the week, and can make measurable gains during the admission.  Patient will also benefit from the coordinated team approach during an Inpatient Acute Rehabilitation admission.  The patient will receive intensive therapy as well as Rehabilitation physician, nursing, social worker, and care management interventions.  Due to safety, skin/wound care, disease management, medication administration, pain management, and patient education the patient requires 24 hour Mitchell day rehabilitation nursing.  The patient is currently min Mitchell with mobility and basic ADLs.  Discharge setting and therapy post discharge at assisted living facility is anticipated.  Patient has agreed to participate in the Acute Inpatient Rehabilitation Program and will admit today.   Preadmission Screen Completed By:  Genella Mech, 08/17/2021 9:43 AM ______________________________________________________________________   Discussed status with Dr. Dagoberto Ligas  on 08/17/21  at 830 and received approval for admission today.   Admission Coordinator:  Genella Mech, CCC-SLP, time 952/Date 08/17/21    Assessment/Plan: Diagnosis: Does the need for close, 24 hr/day Medical supervision in  concert with the patient's rehab needs make it unreasonable for this patient to be served in Mitchell less intensive setting? Yes Co-Morbidities requiring supervision/potential complications: sundowning, L BG stroke, recent kyphoplasty for thoracic/lumbar compression fx's, Sacral fx WBAT; HTN, HLD; osteoporosis Due to bladder management, bowel management, safety, skin/wound care, disease management, medication administration, pain management, and patient education, does the patient require 24 hr/day rehab nursing? Yes Does the patient require coordinated care of Mitchell physician, rehab nurse, PT, OT, and SLP to address physical and functional deficits in the context of the above medical diagnosis(es)? Yes Addressing deficits in the following areas: balance, endurance, locomotion, strength, transferring, bowel/bladder control, bathing, dressing, feeding, grooming, toileting, and cognition Can the patient actively participate in an intensive therapy program of at least 3 hrs of therapy 5 days Mitchell week? Yes The  potential for patient to make measurable gains while on inpatient rehab is good Anticipated functional outcomes upon discharge from inpatient rehab: min assist PT, min assist OT, min assist SLP Estimated rehab length of stay to reach the above functional goals is: 16-20 days Anticipated discharge destination: Home 10. Overall Rehab/Functional Prognosis: good

## 2021-08-17 NOTE — Progress Notes (Signed)
Patient transferred to 4W-18. Report called to [facility], LPN. Patient A&Ox3 at time of transfer and stable. All belongings including hearing aids and charger sent with patient.

## 2021-08-17 NOTE — Progress Notes (Signed)
Inpatient Rehab Admissions Coordinator:   I have a CIR bed for this Pt. And will admit her today. RN may call report to [phone].  Antonette Hendricks, MS, CCC-SLP Rehab Admissions Coordinator  [phone] (celll) [phone] (office)  

## 2021-08-17 NOTE — Discharge Summary (Signed)
Physician Discharge Summary   Patient: Marie Mitchell MRN: [id number] DOB: 1934-02-14  Admit date:     08/14/2021  Discharge date: 08/17/21  Discharge Physician: Tyrone Nine   PCP: System, Provider Not In   Recommendations at discharge:  Continue rehabilitation per PM&R.  Follow up with neurology s/p CVA admission. DAPT x3 weeks recommended followed by aspirin monotherapy. Started atorvastatin 40mg  and norvasc 5mg  as well.   Discharge Diagnoses: Principal Problem:   Acute CVA (cerebrovascular accident) [facility]) Active Problems:   Falls frequently   Sacral fracture (HCC)   HTN (hypertension)   Anxiety   Osteoporosis   Hospital Course: Marie Mitchell is an 88 y.o. female with a history of osteoporosis, vitamin D deficiency, anxiety who presented from home after a fall that occurred 2 nights PTA. On the day of arrival, 5/24 she was noted to be more confused and said it was difficult to see and write. She went to make dinner in her kitchen. Her legs felt weak and she fell without head injury or LOC. EMS was alerted and she was brought to the ED. The daughter was with her last night in the ED and she noticed that she was slurring speech and choking on water. Patient was admitted for further work-up MRI brain showed acute perforator infarct in the left basal ganglia extending into the overlying corona radiata.MRI sacrum acute nondisplaced fracture of the inferior aspect of the left sacral ala extending into the S3 vertebral body with associated marrow edema and adjacent soft tissue edema, mild muscle edema in the left pyriformis muscle likely reflecting reactive edema. Neurology was consulted and the patient was admitted for stroke work up and therapy evaluation. Kindly see details below.   Assessment and Plan: Left basal ganglia / corona radiata stroke likely atheroembolic: HbA1c 5.3%. No cardioembolic source on echo. CTA head and neck shows patent common carotid internal carotid and vertebral  arteries, nonspecific 10 mm cutaneous/subcutaneous lesion within the left perimandibular soft tissue, Severe focal stenosis within the left callosomarginal artery at the A3/A4 junction level. - Continue atorvastatin 40mg  (newly started for LDL 119 with goal < 70).    - Stroke neurology consulted and is recommending DAPT x3 weeks then aspirin 81mg  daily thereafter.  - Neurology follow up recommended. - Continue rehabilitation at [facility].  Falls frequently Sacral fracture: Seen in MRI as above continue pain control PT OT. Discussed with my and Dr. 88 reviewed the film WBAT- OP f/u.   HTN:BP has been elevated for which norvasc 5mg  was started after permissive HTN window has passed.    Anxiety: Quiescent.  - Cont home prozac.  Osteoporosis with vertebral compression fractures s/p recent kyphoplasty April 2023: Cont vitamin D supplementation.  Consultants: Neurology, Orthopedics Procedures performed: None  Disposition:  CIR Diet recommendation:  Cardiac diet DISCHARGE MEDICATION: Allergies as of 08/17/2021       Reactions   Bacitracin    Not on MAR        Medication List     TAKE these medications    acetaminophen 500 MG tablet Commonly known as: TYLENOL Take 500 mg by mouth every 6 (six) hours as needed (for pain).   amLODipine 5 MG tablet Commonly known as: NORVASC Take 1 tablet (5 mg total) by mouth daily. Start taking on: Aug 18, 2021   aspirin EC 81 MG tablet Take 1 tablet (81 mg total) by mouth daily. Swallow whole. Start taking on: Aug 18, 2021   atorvastatin 40 MG tablet Commonly known as:  LIPITOR Take 1 tablet (40 mg total) by mouth daily. Start taking on: Aug 18, 2021   calcium carbonate 1500 (600 Ca) MG Tabs tablet Commonly known as: OSCAL Take 600-1,200 mg by mouth See admin instructions. Give 2 tablets by mouth in the morning then give 1 tablet every evening   Cholecalciferol 25 MCG (1000 UT) tablet Take 1,000 Units by mouth at bedtime.    clopidogrel 75 MG tablet Commonly known as: PLAVIX Take 1 tablet (75 mg total) by mouth daily for 21 days. Start taking on: Aug 18, 2021   FLUoxetine 20 MG tablet Commonly known as: PROZAC Take 30 mg by mouth daily.   polyethylene glycol 17 g packet Commonly known as: MIRALAX / GLYCOLAX Take 17 g by mouth daily as needed (for constipation).   Turmeric 500 MG Tabs Take 500 mg by mouth 2 (two) times daily.   vitamin B-12 1000 MCG tablet Commonly known as: CYANOCOBALAMIN Take 1,000 mcg by mouth daily.        Discharge Exam: BP (!) 145/85 (BP Location: Right Arm)   Pulse 69   Temp 97.8 F (36.6 C)   Resp 18   SpO2 98%   Elderly female in no distress Clear, nonlabored RRR Alert, oriented grossly with bradykinetic right arm movements, though strength generally preserved. HOH.   Condition at discharge: stable  The results of significant diagnostics from this hospitalization (including imaging, microbiology, ancillary and laboratory) are listed below for reference.   Imaging Studies: CT HEAD WO CONTRAST ( )  Result Date: 08/14/2021 CLINICAL DATA:  Head trauma due to a fall. No loss of consciousness. No blood thinner use. EXAM: CT HEAD WITHOUT CONTRAST TECHNIQUE: Contiguous axial images were obtained from the base of the skull through the vertex without intravenous contrast. RADIATION DOSE REDUCTION: This exam was performed according to the departmental dose-optimization program which includes automated exposure control, adjustment of the mA and/or kV according to patient size and/or use of iterative reconstruction technique. COMPARISON:  04/22/2021 FINDINGS: Brain: Diffuse cerebral atrophy. Ventricular dilatation consistent with central atrophy. Low-attenuation changes in the deep white matter consistent with small vessel ischemia. No abnormal extra-axial fluid collections. No mass effect or midline shift. Gray-white matter junctions are distinct. Basal cisterns are not  effaced. No acute intracranial hemorrhage. Vascular: Intracranial arterial vascular calcifications. Skull: Calvarium appears intact. Small subcutaneous scalp hematoma over the anterior frontal region. Sinuses/Orbits: Paranasal sinuses and mastoid air cells are clear. Other: None. IMPRESSION: No acute intracranial abnormalities. Chronic atrophy and small vessel ischemic changes. Electronically Signed   By: Burman Nieves M.D.   On: 08/14/2021 20:50   CT Cervical Spine Wo Contrast  Result Date: 08/14/2021 CLINICAL DATA:  Acute neck pain after a fall. EXAM: CT CERVICAL SPINE WITHOUT CONTRAST TECHNIQUE: Multidetector CT imaging of the cervical spine was performed without intravenous contrast. Multiplanar CT image reconstructions were also generated. RADIATION DOSE REDUCTION: This exam was performed according to the departmental dose-optimization program which includes automated exposure control, adjustment of the mA and/or kV according to patient size and/or use of iterative reconstruction technique. COMPARISON:  04/22/2021 FINDINGS: Alignment: Straightening of usual cervical lordosis without anterior subluxation. This may be due to patient positioning but could indicate muscle spasm. Normal alignment of the posterior elements. C1-2 articulation appears intact. Skull base and vertebrae: No acute fracture. No primary bone lesion or focal pathologic process. Soft tissues and spinal canal: No prevertebral fluid or swelling. No visible canal hematoma. Disc levels: Diffuse degenerative change with intervertebral disc space narrowing and  endplate osteophyte formation throughout. Degenerative changes in the facet joints. Upper chest: Mild scarring and calcification in the lung apices, likely postinflammatory. Other: None. IMPRESSION: Nonspecific straightening of usual cervical lordosis. Diffuse degenerative change. No acute displaced fractures are identified. Electronically Signed   By: Burman Nieves M.D.   On:  08/14/2021 20:52   CT Lumbar Spine Wo Contrast  Result Date: 08/15/2021 CLINICAL DATA:  Low back pain, trauma, fall EXAM: CT LUMBAR SPINE WITHOUT CONTRAST TECHNIQUE: Multidetector CT imaging of the lumbar spine was performed without intravenous contrast administration. Multiplanar CT image reconstructions were also generated. RADIATION DOSE REDUCTION: This exam was performed according to the departmental dose-optimization program which includes automated exposure control, adjustment of the mA and/or kV according to patient size and/or use of iterative reconstruction technique. COMPARISON:  None Available. FINDINGS: Evaluation is somewhat limited by degree of osteopenia segmentation: 5 lumbar type vertebrae. Alignment: Trace anterolisthesis of T11 on T12, in part secondary to the T12 compression fracture. Trace anterolisthesis of L3 on L4 and L4 on L5 which appears facet mediated. Vertebrae: No definite acute fracture. T12 and L1 chronic appearing compression fractures status post kyphoplasty. There is approximately 5 mm of retropulsion of the posterosuperior cortex of T12 and 4 mm retropulsion of the posterosuperior cortex of L1 L4-L5 posterior decompression and fixation with interbody disc spacer. Paraspinal and other soft tissues: Aortic atherosclerosis. Prior cholecystectomy. Diverticulosis without evidence diverticulitis. Disc levels: Mild spinal canal stenosis at T11-T12 and T12-L1 secondary to retropulsed fracture fragments. Moderate spinal canal stenosis at L3-L4 secondary to disc bulge, ligamentum flavum hypertrophy, and facet arthropathy. IMPRESSION: Evaluation is somewhat limited by degree of osteopenia. Within this limitation, no definite acute fracture or traumatic listhesis. Electronically Signed   By: Wiliam Ke M.D.   On: 08/15/2021 00:27   MR BRAIN WO CONTRAST  Result Date: 08/15/2021 CLINICAL DATA:  Neuro deficit, acute, stroke suspected EXAM: MRI HEAD WITHOUT CONTRAST TECHNIQUE:  Multiplanar, multiecho pulse sequences of the brain and surrounding structures were obtained without intravenous contrast. COMPARISON:  CT head Aug 14, 2021. FINDINGS: Brain: Acute perforator infarct in the left basal ganglia, extending into overlying corona radiata. Slight edema without mass effect. Mild to moderate additional scattered T2/FLAIR hyperintensities in the white matter, nonspecific but compatible with chronic microvascular disease. No evidence of acute hemorrhage, mass lesion, midline shift, hydrocephalus, or extra-axial fluid collection. A few small foci of susceptibility artifact, compatible with prior microhemorrhages. Vascular: Major arterial flow voids are maintained skull base. Skull and upper cervical spine: Normal marrow signal. Sinuses/Orbits: Clear sinuses.  No acute orbital findings. Other: No mastoid effusions IMPRESSION: Acute perforator infarct in the left basal ganglia, extending into overlying corona radiata. Slight edema without mass effect. Electronically Signed   By: Feliberto Harts M.D.   On: 08/15/2021 10:35   MR SACRUM SI JOINTS WO CONTRAST  Result Date: 08/15/2021 CLINICAL DATA:  Hip trauma, left-sided pain. EXAM: MRI LUMBAR SPINE WITHOUT CONTRAST TECHNIQUE: Multiplanar, multisequence MR imaging of the lumbar spine was performed. No intravenous contrast was administered. COMPARISON:  CT hip 08/14/2021 FINDINGS: Patient motion degrades image quality limiting evaluation. Bones/Joint/Cartilage Acute nondisplaced fracture of the inferior aspect of the left sacral ala extending into the S3 vertebral body with associated marrow edema and adjacent soft tissue edema. No other acute fracture or subluxation. Normal alignment. No joint effusion. Mild osteoarthritis of bilateral SI joints. No SI joint widening or erosive changes. No subchondral reactive marrow changes. No SI joint effusion. Partially visualized posterior lumbar fusion at L4-5.  Mild broad-based disc bulge at L5-S1 and  bilateral facet arthropathy. Lumbar spine is better evaluated on the CT of the lumbar spine performed earlier same day and reported separately. Ligaments, Muscles and Tendons Mild muscle edema in the left piriformis muscle likely reflecting reactive edema secondary to the adjacent sacral fracture versus mild muscle strain. No intramuscular fluid collection or hematoma. Soft tissue No fluid collection or hematoma. No soft tissue mass. Normal neurovascular bundles. IMPRESSION: 1. Acute nondisplaced fracture of the inferior aspect of the left sacral ala extending into the S3 vertebral body with associated marrow edema and adjacent soft tissue edema. 2. Mild muscle edema in the left piriformis muscle likely reflecting reactive edema secondary to the adjacent sacral fracture versus mild muscle strain. Electronically Signed   By: Elige Ko M.D.   On: 08/15/2021 08:11   CT Hip Left Wo Contrast  Result Date: 08/15/2021 CLINICAL DATA:  Hip trauma, fracture suspected. EXAM: CT OF THE LEFT HIP WITHOUT CONTRAST TECHNIQUE: Multidetector CT imaging of the left hip was performed according to the standard protocol. Multiplanar CT image reconstructions were also generated. RADIATION DOSE REDUCTION: This exam was performed according to the departmental dose-optimization program which includes automated exposure control, adjustment of the mA and/or kV according to patient size and/or use of iterative reconstruction technique. COMPARISON:  08/14/2021. FINDINGS: Bones/Joint/Cartilage No acute fracture or dislocation at the left hip. Mild degenerative changes are present at the left hip. There is a linear lucency involving the lateral aspect of the inferior sacrum on the left and cortical offset, suspicious for nondisplaced fracture. Degenerative changes are noted at the symphysis pubis and sacroiliac joints bilaterally. Ligaments Suboptimally assessed by CT. Muscles and Tendons There is mild edema and enlargement of the posterior  aspect of the obturator internus and piriformis muscles on the left in the region of the presacral space with fat stranding. No significant hematoma is seen. Soft tissues No significant hematoma. Scattered diverticula are noted along the colon without evidence of diverticulitis. There is a left inguinal hernia containing nonobstructed bowel. A Foley catheter is present in the urinary bladder IMPRESSION: 1. No acute fracture at the left hip. 2. Linear lucency and cortical offset in the inferior aspect of the lateral aspect of the sacrum on the left, possible nondisplaced fracture. MRI is suggested for further evaluation. 3. Soft tissue edema and enlargement of the piriformis and obturator internus muscles on the left. No intramuscular hematoma is identified. 4. Left inguinal hernia containing nonobstructed bowel. Electronically Signed   By: Thornell Sartorius M.D.   On: 08/15/2021 00:24   ECHOCARDIOGRAM COMPLETE  Result Date: 08/15/2021    ECHOCARDIOGRAM REPORT   Patient Name:   KHLOEY CHERN Date of Exam: 08/15/2021 Medical Rec #:  [id number]       Height:       67.0 in Accession #:    [id number]      Weight:       130.0 lb Date of Birth:  10-14-1933       BSA:          1.684 m Patient Age:    88 years        BP:           148/90 mmHg Patient Gender: F               HR:           71 bpm. Exam Location:  Inpatient Procedure: 2D Echo, Cardiac Doppler and Color Doppler Indications:  Stroke I63.9  History:        Patient has no prior history of Echocardiogram examinations.                 Stroke; Risk Factors:Hypertension.  Sonographer:    Eulah PontSarah Pirrotta RDCS Referring Phys: [id number] Teddy SpikeYRONE A KYLE IMPRESSIONS  1. Left ventricular ejection fraction, by estimation, is 55 to 60%. Left ventricular ejection fraction by 2D MOD biplane is 59.3 %. The left ventricle has normal function. The left ventricle has no regional wall motion abnormalities. Left ventricular diastolic parameters are consistent with Grade I diastolic  dysfunction (impaired relaxation).  2. Right ventricular systolic function is normal. The right ventricular size is normal. There is normal pulmonary artery systolic pressure. The estimated right ventricular systolic pressure is 25.5 mmHg.  3. The mitral valve is grossly normal. Trivial mitral valve regurgitation.  4. The aortic valve is tricuspid. Aortic valve regurgitation is not visualized.  5. The inferior vena cava is normal in size with greater than 50% respiratory variability, suggesting right atrial pressure of 3 mmHg. Comparison(s): No prior Echocardiogram. FINDINGS  Left Ventricle: Left ventricular ejection fraction, by estimation, is 55 to 60%. Left ventricular ejection fraction by 2D MOD biplane is 59.3 %. The left ventricle has normal function. The left ventricle has no regional wall motion abnormalities. The left ventricular internal cavity size was normal in size. There is no left ventricular hypertrophy. Left ventricular diastolic parameters are consistent with Grade I diastolic dysfunction (impaired relaxation). Indeterminate filling pressures. Right Ventricle: The right ventricular size is normal. No increase in right ventricular wall thickness. Right ventricular systolic function is normal. There is normal pulmonary artery systolic pressure. The tricuspid regurgitant velocity is 2.37 m/s, and  with an assumed right atrial pressure of 3 mmHg, the estimated right ventricular systolic pressure is 25.5 mmHg. Left Atrium: Left atrial size was normal in size. Right Atrium: Right atrial size was normal in size. Pericardium: There is no evidence of pericardial effusion. Mitral Valve: The mitral valve is grossly normal. Trivial mitral valve regurgitation. Tricuspid Valve: The tricuspid valve is grossly normal. Tricuspid valve regurgitation is mild. Aortic Valve: The aortic valve is tricuspid. Aortic valve regurgitation is not visualized. Pulmonic Valve: The pulmonic valve was normal in structure. Pulmonic  valve regurgitation is not visualized. Aorta: The aortic root and ascending aorta are structurally normal, with no evidence of dilitation. Venous: The inferior vena cava is normal in size with greater than 50% respiratory variability, suggesting right atrial pressure of 3 mmHg. IAS/Shunts: No atrial level shunt detected by color flow Doppler.  LEFT VENTRICLE PLAX 2D                        Biplane EF (MOD) LVIDd:         4.30 cm         LV Biplane EF:   Left LVIDs:         3.10 cm                          ventricular LV PW:         0.90 cm                          ejection LV IVS:        0.70 cm  fraction by LVOT diam:     2.10 cm                          2D MOD LV SV:         62                               biplane is LV SV Index:   37                               59.3 %. LVOT Area:     3.46 cm                                Diastology                                LV e' medial:    5.48 cm/s LV Volumes (MOD)               LV E/e' medial:  9.7 LV vol d, MOD    56.8 ml       LV e' lateral:   7.20 cm/s A2C:                           LV E/e' lateral: 7.4 LV vol d, MOD    68.8 ml A4C: LV vol s, MOD    23.1 ml A2C: LV vol s, MOD    28.6 ml A4C: LV SV MOD A2C:   33.7 ml LV SV MOD A4C:   68.8 ml LV SV MOD BP:    37.8 ml RIGHT VENTRICLE RV S prime:     11.30 cm/s TAPSE (M-mode): 2.0 cm LEFT ATRIUM             Index        RIGHT ATRIUM           Index LA diam:        3.00 cm 1.78 cm/m   RA Area:     13.70 cm LA Vol (A2C):   45.9 ml 27.26 ml/m  RA Volume:   30.70 ml  18.23 ml/m LA Vol (A4C):   34.0 ml 20.19 ml/m LA Biplane Vol: 41.4 ml 24.59 ml/m  AORTIC VALVE LVOT Vmax:   82.50 cm/s LVOT Vmean:  53.000 cm/s LVOT VTI:    0.180 m  AORTA Ao Root diam: 3.60 cm Ao Asc diam:  3.60 cm MITRAL VALVE               TRICUSPID VALVE MV Area (PHT): 1.91 cm    TR Peak grad:   22.5 mmHg MV Decel Time: 398 msec    TR Vmax:        237.00 cm/s MV E velocity: 53.30 cm/s MV A velocity: 44.80 cm/s  SHUNTS MV E/A  ratio:  1.19        Systemic VTI:  0.18 m                            Systemic Diam: 2.10 cm Zoila Shutter MD Electronically signed by Zoila Shutter MD Signature Date/Time: 08/15/2021/4:38:01 PM    Final  DG Hip Unilat W or Wo Pelvis 2-3 Views Left  Result Date: 08/14/2021 CLINICAL DATA:  Fall, left hip pain EXAM: DG HIP (WITH OR WITHOUT PELVIS) 2-3V LEFT COMPARISON:  None Available. FINDINGS: Normal alignment. No acute fracture or dislocation. Mild left hip degenerative arthritis. Lumbar fusion hardware partially visualized. Foley catheter overlies the expected bladder lumen. Soft tissues are otherwise unremarkable. IMPRESSION: No acute abnormality Electronically Signed   By: Helyn Numbers M.D.   On: 08/14/2021 21:06   CT ANGIO HEAD NECK W WO CM (CODE STROKE)  Result Date: 08/15/2021 CLINICAL DATA:  Stroke, follow-up. EXAM: CT ANGIOGRAPHY HEAD AND NECK TECHNIQUE: Multidetector CT imaging of the head and neck was performed using the standard protocol during bolus administration of intravenous contrast. Multiplanar CT image reconstructions and MIPs were obtained to evaluate the vascular anatomy. Carotid stenosis measurements (when applicable) are obtained utilizing NASCET criteria, using the distal internal carotid diameter as the denominator. RADIATION DOSE REDUCTION: This exam was performed according to the departmental dose-optimization program which includes automated exposure control, adjustment of the mA and/or kV according to patient size and/or use of iterative reconstruction technique. CONTRAST:  75mL OMNIPAQUE IOHEXOL 350 MG/ML SOLN COMPARISON:  Brain MRI 08/15/2021. FINDINGS: CT HEAD FINDINGS Brain: Mild generalized cerebral atrophy. Redemonstrated 16 mm acute infarct within the left corona radiata. Background moderate patchy and ill-defined hypoattenuation within the cerebral white matter, nonspecific but compatible with chronic small vessel ischemic disease. There is no acute intracranial  hemorrhage. No extra-axial fluid collection. No evidence of an intracranial mass. No midline shift. Vascular: No hyperdense vessel.  Atherosclerotic calcifications. Skull: No fracture or aggressive osseous lesion. Sinuses: Trace mucosal thickening scattered within the bilateral ethmoid sinuses. Orbits: No orbital mass or acute orbital finding. Review of the MIP images confirms the above findings CTA NECK FINDINGS Aortic arch: Common origin of the innominate and left common carotid arteries. The visualized aortic arch is normal in caliber. No hemodynamically significant innominate or proximal subclavian artery stenosis. Right carotid system: CCA and ICA patent within the neck without stenosis. No significant atherosclerotic disease. Left carotid system: CCA and ICA patent within the neck without stenosis. No significant atherosclerotic disease. Vertebral arteries: Vertebral arteries patent within the neck without hemodynamically significant stenosis. Skeleton: Cervical dextrocurvature. Cervical spondylosis. Disc space narrowing is advanced at C5-C6, C6-C7 and C7-T1. No appreciable high-grade spinal canal stenosis. Multilevel bony neural foraminal narrowing. No acute fracture or aggressive osseous lesion. Other neck: Nonspecific 10 mm cutaneous/subcutaneous lesion within the left perimandibular soft tissues (series 13, image 237). Upper chest: No consolidation within the imaged lung apices. Review of the MIP images confirms the above findings CTA HEAD FINDINGS Anterior circulation: The intracranial internal carotid arteries are patent. The M1 middle cerebral arteries are patent. No M2 proximal branch occlusion or high-grade proximal stenosis. The anterior cerebral arteries are patent. The right A1 segment is developmentally hypoplastic. 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 1-2 mm inferiorly projecting vascular  protrusion arising from the supraclinoid left ICA, which may reflect an infundibulum or aneurysm. Posterior circulation: The intracranial vertebral arteries are patent. The basilar artery is patent. The posterior cerebral arteries are patent. The right PCA is fetal in origin. The left posterior communicating artery is diminutive or absent. Mild focal stenosis within the left PCA at the P1/P2 junction. Venous sinuses: Within the limitations of contrast timing, no convincing thrombus. Anatomic variants: As described Review of the  MIP images confirms the above findings IMPRESSION: CT head: 1. Redemonstrated 16 mm acute infarct within the left corona radiata. 2. Background moderate chronic small vessel ischemic changes within the cerebral white matter. 3. Mild generalized cerebral atrophy. CTA neck: 1. The common carotid, internal carotid and vertebral arteries are patent within the neck without stenosis or significant atherosclerotic disease. 2. Cervical spondylosis and cervical dextrocurvature. 3. Nonspecific 10 mm cutaneous/subcutaneous lesion within the left perimandibular soft tissues. Direct visualization recommended. CTA head: 1. No intracranial large vessel occlusion is identified. 2. 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 3. Mild focal stenosis within the left PCA at the P1/P2 junction. Electronically Signed   By: Jackey Loge D.O.   On: 08/15/2021 13:30    Microbiology: Results for orders placed or performed during the hospital encounter of 04/22/21  Resp Panel by RT-PCR (Flu A&B, Covid) Nasopharyngeal Swab     Status: None   Collection Time: 04/22/21 10:35 PM   Specimen: Nasopharyngeal Swab; Nasopharyngeal(NP) swabs in vial transport medium  Result Value Ref Range Status   SARS Coronavirus 2 by RT PCR NEGATIVE NEGATIVE Final    Comment: (NOTE) SARS-CoV-2 target nucleic acids are NOT DETECTED.  The SARS-CoV-2 RNA is generally detectable in upper  respiratory specimens during the acute phase of infection. The lowest concentration of SARS-CoV-2 viral copies this assay can detect is 138 copies/mL. A negative result does not preclude SARS-Cov-2 infection and should not be used as the sole basis for treatment or other patient management decisions. A negative result may occur with  improper specimen collection/handling, submission of specimen other than nasopharyngeal swab, presence of viral mutation(s) within the areas targeted by this assay, and inadequate number of viral copies(<138 copies/mL). A negative result must be combined with clinical observations, patient history, and epidemiological information. The expected result is Negative.  Fact Sheet for Patients:  BloggerCourse.com  Fact Sheet for Healthcare Providers:  SeriousBroker.it  This test is no t yet approved or cleared by the [place] FDA and  has been authorized for detection and/or diagnosis of SARS-CoV-2 by FDA under an Emergency Use Authorization (EUA). This EUA will remain  in effect (meaning this test can be used) for the duration of the COVID-19 declaration under Section 564(b)(1) of the Act, 21 U.S.C.section 360bbb-3(b)(1), unless the authorization is terminated  or revoked sooner.       Influenza A by PCR NEGATIVE NEGATIVE Final   Influenza B by PCR NEGATIVE NEGATIVE Final    Comment: (NOTE) The Xpert Xpress SARS-CoV-2/FLU/RSV plus assay is intended as an aid in the diagnosis of influenza from Nasopharyngeal swab specimens and should not be used as a sole basis for treatment. Nasal washings and aspirates are unacceptable for Xpert Xpress SARS-CoV-2/FLU/RSV testing.  Fact Sheet for Patients: BloggerCourse.com  Fact Sheet for Healthcare Providers: SeriousBroker.it  This test is not yet approved or cleared by the [place] FDA and has been  authorized for detection and/or diagnosis of SARS-CoV-2 by FDA under an Emergency Use Authorization (EUA). This EUA will remain in effect (meaning this test can be used) for the duration of the COVID-19 declaration under Section 564(b)(1) of the Act, 21 U.S.C. section 360bbb-3(b)(1), unless the authorization is terminated or revoked.  Performed at [facility], [address] [place], [place] [address]   Urine Culture     Status: Abnormal   Collection Time: 04/22/21 11:33 PM   Specimen: Urine, Clean Catch  Result Value Ref Range Status  Specimen Description   Final    URINE, CLEAN CATCH Performed at [facility], [address] [place], [place] [address]    Special Requests   Final    NONE Performed at [facility], [address] [place], [place] [address]    Culture >=100,000 COLONIES/mL KLEBSIELLA PNEUMONIAE (A)  Final   Report Status 04/25/2021 FINAL  Final   Organism ID, Bacteria KLEBSIELLA PNEUMONIAE (A)  Final      Susceptibility   Klebsiella pneumoniae - MIC*    AMPICILLIN >=32 RESISTANT Resistant     CEFAZOLIN <=4 SENSITIVE Sensitive     CEFEPIME <=0.12 SENSITIVE Sensitive     CEFTRIAXONE <=0.25 SENSITIVE Sensitive     CIPROFLOXACIN <=0.25 SENSITIVE Sensitive     GENTAMICIN <=1 SENSITIVE Sensitive     IMIPENEM <=0.25 SENSITIVE Sensitive     NITROFURANTOIN 64 INTERMEDIATE Intermediate     TRIMETH/SULFA <=20 SENSITIVE Sensitive     AMPICILLIN/SULBACTAM 4 SENSITIVE Sensitive     PIP/TAZO <=4 SENSITIVE Sensitive     * >=100,000 COLONIES/mL KLEBSIELLA PNEUMONIAE    Labs: CBC: Recent Labs  Lab 08/14/21 2153  WBC 7.5  NEUTROABS 6.2  HGB 13.0  HCT 41.3  MCV 96.0  PLT 226   Basic Metabolic Panel: Recent Labs  Lab 08/14/21 2153  NA 138  K 3.6  CL 107  CO2 26  GLUCOSE 116*  BUN 20  CREATININE 1.04*  CALCIUM 8.7*   Liver Function Tests: Recent Labs  Lab 08/14/21 2153  AST 30  ALT 25   ALKPHOS 53  BILITOT 0.4  PROT 6.5  ALBUMIN 3.6   CBG: No results for input(s): GLUCAP in the last 168 hours.  Discharge time spent: greater than 30 minutes.  Signed: Tyrone Nine, MD [facility] 08/17/2021

## 2021-08-17 NOTE — H&P (Addendum)
Physical Medicine and Rehabilitation Admission H&P   CC: weakness   Marie Mitchell is an 88 y.o. female.   HPI: Patient is an 88 yo R handed female with past medical history significant for osteoporosis, vitamin d deficiency, frequent falls, chronic Foley, and anxiety. Presented this admission s/p fall at home on 08/12/21, which happened after closing her blinds at home.  Patient is unsure if there was a LOC or head injury.  Initially she did not go to the ED.  However, in the afternoon on 08/13/21 she became increasingly confused with difficulty with seeing and writing. Upon going to the kitchen to prepare dinner her legs became weak and she fell, which resulted in LOC and head injury.  She then went to the ED with her daughter, at which time she had slurred speech and difficulty drinking water without chocking.   MRI brain brain shows acute perforator infarct in the left basal ganglia, extending into overlying corona radiata, with slight edema without mass effect. CT head shows chronic atrophy and small vessel ischemic changes, with no acute intracranial abnormalities.  CTA head negative for large vessel occlusion. 2D echo, normal EF 55-60%. CT left hip was negative for acute hip fracture. Linear lucency and cortical offset in the inferior aspect of the lateral aspect of the sacrum on the left, possible nondisplaced fracture. MR sacrum demonstrated acute nondisplaced fracture of the inferior aspect of the left sacral ala extending into the S3 vertebral body with associated marrow edema and adjacent soft tissue edema.   Patient's LDL 119; HbA1c 5.3; and EF 55-60%; also NIHSS 5     Her Foley was changed in the ED.  Patient needs to be admitted to CIR for acute decline in level of function: mobility, ADL's, and cognition.   According to her daughter, had Botox of bladder recently. Uses chronic foley for complex bladder issues- is a massive under reporter and will never say she's hurting- suggested either  scheduling tylenol, or that doesn't work, putting her on Tramadol, although it makes her constipated.        Review of Systems  Unable to perform ROS: Acuity of condition  Constitutional:  Positive for fatigue.  History reviewed. No pertinent past medical history.   No family history on file. Social History:  reports that she has never smoked. She has never used smokeless tobacco. She reports current alcohol use. No history on file for drug use. Allergies:       Allergies  Allergen Reactions   Bacitracin        Not on MAR          Medications Prior to Admission  Medication Sig Dispense Refill   acetaminophen (TYLENOL) 500 MG tablet Take 500 mg by mouth every 6 (six) hours as needed (for pain).       calcium carbonate (OSCAL) 1500 (600 Ca) MG TABS tablet Take 600-1,200 mg by mouth See admin instructions. Give 2 tablets by mouth in the morning then give 1 tablet every evening       Cholecalciferol 25 MCG (1000 UT) tablet Take 1,000 Units by mouth at bedtime.       FLUoxetine (PROZAC) 20 MG tablet Take 30 mg by mouth daily.       polyethylene glycol (MIRALAX / GLYCOLAX) 17 g packet Take 17 g by mouth daily as needed (for constipation).       Turmeric 500 MG TABS Take 500 mg by mouth 2 (two) times daily.  vitamin B-12 (CYANOCOBALAMIN) 1000 MCG tablet Take 1,000 mcg by mouth daily.          Drug Regimen Review  Drug regimen was reviewed and remains appropriate with no significant issues identified     Home: Home Living Family/patient expects to be discharged to:: Assisted living Living Arrangements: Alone, Other (Comment) Available Help at Discharge: Family, Available PRN/intermittently, Skilled Nursing Facility Type of Home: Assisted living Home Access: Level entry Home Layout: One level Bathroom Shower/Tub: Health visitor: Handicapped height Bathroom Accessibility: Yes Home Equipment: Agricultural consultant (2 wheels), Grab bars - toilet, Grab bars - tub/shower,  Hand held shower head, Rollator (4 wheels) Additional Comments: Uses 4WRW to walk to common dinning room.  Lives With: Other (Comment)   Functional History: Prior Function Prior Level of Function : Needs assist, History of Falls (last six months)   Functional Status:  Mobility: Bed Mobility Overal bed mobility: Needs Assistance Bed Mobility: Supine to Sit Supine to sit: Min assist General bed mobility comments: If given time, pt can move to EOB without much assist.  Did assist to get hips to EOB with use of pad once pt was sitting with LEs off bed. Transfers Overall transfer level: Needs assistance Equipment used: Rolling walker (2 wheels) Transfers: Sit to/from Stand, Bed to chair/wheelchair/BSC Sit to Stand: Mod assist, Max assist, From elevated surface Bed to/from chair/wheelchair/BSC transfer type:: Step pivot Stand pivot transfers: Mod assist Step pivot transfers: Mod assist, Max assist General transfer comment: Mod to max A to power up into standing and weight shift forward.  Pt with forward lean onto RW and hips shifted to her right side. Pt had diffifulty with anterior weight shift and keeps buttocks posterior to body. Also with narrow BOS and would not widen it even wtih commands.  She was able to step around to the chair with mod to max assist.   ADL: ADL Overall ADL's : Needs assistance/impaired Eating/Feeding: NPO Grooming: Wash/dry hands, Wash/dry face, Min guard, Sitting Upper Body Bathing: Minimal assistance, Sitting Lower Body Bathing: Sitting/lateral leans, Maximal assistance Upper Body Dressing : Minimal assistance, Sitting Lower Body Dressing: Maximal assistance, Sit to/from stand Toilet Transfer: Moderate assistance, Stand-pivot, BSC/3in1, Rolling walker (2 wheels) Functional mobility during ADLs: Minimal assistance, Maximal assistance, Rolling walker (2 wheels) (Initially Max A for correcting posterior lean.) General ADL Comments: Pt presenting with decreased  coordination, strength, balance, and processing.   Cognition: Cognition Overall Cognitive Status: Impaired/Different from baseline Orientation Level: Oriented to person, Oriented to place, Oriented to time, Disoriented to situation Cognition Arousal/Alertness: Awake/alert Behavior During Therapy: WFL for tasks assessed/performed Overall Cognitive Status: Impaired/Different from baseline Area of Impairment: Memory, Following commands, Attention, Problem solving Orientation Level: Person, Place, Time Current Attention Level: Selective Memory: Decreased short-term memory Following Commands: Follows one step commands inconsistently Problem Solving: Decreased initiation, Requires verbal cues, Slow processing General Comments: Demonstrating selective attention to perform ADLs in distracting environment. Unable to divide to perform two tasks together.     Blood pressure (!) 145/85, pulse 69, temperature 97.8 F (36.6 C), resp. rate 18, SpO2 98 %. Physical Exam Vitals and nursing note reviewed.  Constitutional:      Comments: Awake, alert, appears frail; very HOH- wearing hearing aids; sitting up in bed; limited historian; hair done; NAD  HENT:     Head: Normocephalic and atraumatic.     Comments: No facial droop seen Tongue midline Wearing hearing aids    Right Ear: External ear normal.     Left  Ear: External ear normal.     Nose: Nose normal. No congestion.     Mouth/Throat:     Mouth: Mucous membranes are dry.     Pharynx: Oropharynx is clear. No oropharyngeal exudate.  Eyes:     General:        Right eye: No discharge.        Left eye: No discharge.     Extraocular Movements: Extraocular movements intact.  Cardiovascular:     Rate and Rhythm: Normal rate and regular rhythm.     Heart sounds: Normal heart sounds. No murmur heard.   No gallop.  Pulmonary:     Effort: Pulmonary effort is normal. No respiratory distress.     Breath sounds: Normal breath sounds. No stridor. No  wheezing or rales.  Abdominal:     General: Bowel sounds are normal. There is no distension.     Palpations: Abdomen is soft.     Tenderness: There is no abdominal tenderness.  Genitourinary:    Comments: Has foley in place- medium amber urine in bag Musculoskeletal:     Cervical back: Neck supple. No tenderness.     Comments: RUE- deltoid, biceps, triceps 4+/5; otherwise 5-/5 LUE 5/5  RLE- HF 4+/5 and R PF 4+/5; otherwise 5-/5 LLE- 5-/5 throughout  Skin:    General: Skin is warm and dry.     Comments: L forearm IV- a little blood flash in IV- looks OK otherwise  Neurological:     Mental Status: She is alert.     Comments: Limited historian Intact to light touch in all 4 extremities and face Ox2- however got confused on changing foley as well as why here  Psychiatric:     Comments: Flat, vague affect      Lab Results Last 48 Hours        Results for orders placed or performed during the hospital encounter of 08/14/21 (from the past 48 hour(s))  Hemoglobin A1c     Status: None    Collection Time: 08/15/21 11:38 AM  Result Value Ref Range    Hgb A1c MFr Bld 5.3 4.8 - 5.6 %      Comment: (NOTE) Pre diabetes:          5.7%-6.4%   Diabetes:              >6.4%   Glycemic control for   <7.0% adults with diabetes      Mean Plasma Glucose 105.41 mg/dL      Comment: Performed at [facility], [address] [place], [place] [id number]  Lipid panel     Status: Abnormal    Collection Time: 08/16/21  4:12 AM  Result Value Ref Range    Cholesterol 236 (H) 0 - 200 mg/dL    Triglycerides 66 <604<150 mg/dL    HDL 540104 >98>40 mg/dL    Total CHOL/HDL Ratio 2.3 RATIO    VLDL 13 0 - 40 mg/dL    LDL Cholesterol 119119 (H) 0 - 99 mg/dL      Comment:        Total Cholesterol/HDL:CHD Risk Coronary Heart Disease Risk Table                     Men   Women  1/2 Average Risk   3.4   3.3  Average Risk       5.0   4.4  2 X Average Risk   9.6   7.1  3  X Average Risk  23.4   11.0        Use the  calculated Patient Ratio above and the CHD Risk Table to determine the patient's CHD Risk.        ATP III CLASSIFICATION (LDL):  <100     mg/dL   Optimal  161-096  mg/dL   Near or Above                    Optimal  130-159  mg/dL   Borderline  045-409  mg/dL   High  >811     mg/dL   Very High Performed at [facility], [address] [place], [place] [address]         Imaging Results (Last 48 hours)  ECHOCARDIOGRAM COMPLETE   Result Date: 08/15/2021    ECHOCARDIOGRAM REPORT   Patient Name:   FAATIMA TENCH Date of Exam: 08/15/2021 Medical Rec #:  [id number]       Height:       67.0 in Accession #:    [id number]      Weight:       130.0 lb Date of Birth:  [birth year]       BSA:          1.684 m Patient Age:    88 years        BP:           148/90 mmHg Patient Gender: F               HR:           71 bpm. Exam Location:  Inpatient Procedure: 2D Echo, Cardiac Doppler and Color Doppler Indications:    Stroke I63.9  History:        Patient has no prior history of Echocardiogram examinations.                 Stroke; Risk Factors:Hypertension.  Sonographer:    Eulah Pont RDCS Referring Phys: [id number] Teddy Spike IMPRESSIONS  1. Left ventricular ejection fraction, by estimation, is 55 to 60%. Left ventricular ejection fraction by 2D MOD biplane is 59.3 %. The left ventricle has normal function. The left ventricle has no regional wall motion abnormalities. Left ventricular diastolic parameters are consistent with Grade I diastolic dysfunction (impaired relaxation).  2. Right ventricular systolic function is normal. The right ventricular size is normal. There is normal pulmonary artery systolic pressure. The estimated right ventricular systolic pressure is 25.5 mmHg.  3. The mitral valve is grossly normal. Trivial mitral valve regurgitation.  4. The aortic valve is tricuspid. Aortic valve regurgitation is not visualized.  5. The inferior vena cava is normal in size with greater than 50% respiratory  variability, suggesting right atrial pressure of 3 mmHg. Comparison(s): No prior Echocardiogram. FINDINGS  Left Ventricle: Left ventricular ejection fraction, by estimation, is 55 to 60%. Left ventricular ejection fraction by 2D MOD biplane is 59.3 %. The left ventricle has normal function. The left ventricle has no regional wall motion abnormalities. The left ventricular internal cavity size was normal in size. There is no left ventricular hypertrophy. Left ventricular diastolic parameters are consistent with Grade I diastolic dysfunction (impaired relaxation). Indeterminate filling pressures. Right Ventricle: The right ventricular size is normal. No increase in right ventricular wall thickness. Right ventricular systolic function is normal. There is normal pulmonary artery systolic pressure. The tricuspid regurgitant velocity is 2.37 m/s, and  with an assumed right atrial pressure of 3 mmHg,  the estimated right ventricular systolic pressure is 25.5 mmHg. Left Atrium: Left atrial size was normal in size. Right Atrium: Right atrial size was normal in size. Pericardium: There is no evidence of pericardial effusion. Mitral Valve: The mitral valve is grossly normal. Trivial mitral valve regurgitation. Tricuspid Valve: The tricuspid valve is grossly normal. Tricuspid valve regurgitation is mild. Aortic Valve: The aortic valve is tricuspid. Aortic valve regurgitation is not visualized. Pulmonic Valve: The pulmonic valve was normal in structure. Pulmonic valve regurgitation is not visualized. Aorta: The aortic root and ascending aorta are structurally normal, with no evidence of dilitation. Venous: The inferior vena cava is normal in size with greater than 50% respiratory variability, suggesting right atrial pressure of 3 mmHg. IAS/Shunts: No atrial level shunt detected by color flow Doppler.  LEFT VENTRICLE PLAX 2D                        Biplane EF (MOD) LVIDd:         4.30 cm         LV Biplane EF:   Left LVIDs:          3.10 cm                          ventricular LV PW:         0.90 cm                          ejection LV IVS:        0.70 cm                          fraction by LVOT diam:     2.10 cm                          2D MOD LV SV:         62                               biplane is LV SV Index:   37                               59.3 %. LVOT Area:     3.46 cm                                Diastology                                LV e' medial:    5.48 cm/s LV Volumes (MOD)               LV E/e' medial:  9.7 LV vol d, MOD    56.8 ml       LV e' lateral:   7.20 cm/s A2C:                           LV E/e' lateral: 7.4 LV vol d, MOD    68.8 ml A4C: LV vol s, MOD    23.1 ml A2C: LV vol s, MOD  28.6 ml A4C: LV SV MOD A2C:   33.7 ml LV SV MOD A4C:   68.8 ml LV SV MOD BP:    37.8 ml RIGHT VENTRICLE RV S prime:     11.30 cm/s TAPSE (M-mode): 2.0 cm LEFT ATRIUM             Index        RIGHT ATRIUM           Index LA diam:        3.00 cm 1.78 cm/m   RA Area:     13.70 cm LA Vol (A2C):   45.9 ml 27.26 ml/m  RA Volume:   30.70 ml  18.23 ml/m LA Vol (A4C):   34.0 ml 20.19 ml/m LA Biplane Vol: 41.4 ml 24.59 ml/m  AORTIC VALVE LVOT Vmax:   82.50 cm/s LVOT Vmean:  53.000 cm/s LVOT VTI:    0.180 m  AORTA Ao Root diam: 3.60 cm Ao Asc diam:  3.60 cm MITRAL VALVE               TRICUSPID VALVE MV Area (PHT): 1.91 cm    TR Peak grad:   22.5 mmHg MV Decel Time: 398 msec    TR Vmax:        237.00 cm/s MV E velocity: 53.30 cm/s MV A velocity: 44.80 cm/s  SHUNTS MV E/A ratio:  1.19        Systemic VTI:  0.18 m                            Systemic Diam: 2.10 cm Zoila Shutter MD Electronically signed by Zoila Shutter MD Signature Date/Time: 08/15/2021/4:38:01 PM    Final     CT ANGIO HEAD NECK W WO CM (CODE STROKE)   Result Date: 08/15/2021 CLINICAL DATA:  Stroke, follow-up. EXAM: CT ANGIOGRAPHY HEAD AND NECK TECHNIQUE: Multidetector CT imaging of the head and neck was performed using the standard protocol during bolus administration of  intravenous contrast. Multiplanar CT image reconstructions and MIPs were obtained to evaluate the vascular anatomy. Carotid stenosis measurements (when applicable) are obtained utilizing NASCET criteria, using the distal internal carotid diameter as the denominator. RADIATION DOSE REDUCTION: This exam was performed according to the departmental dose-optimization program which includes automated exposure control, adjustment of the mA and/or kV according to patient size and/or use of iterative reconstruction technique. CONTRAST:  75mL OMNIPAQUE IOHEXOL 350 MG/ML SOLN COMPARISON:  Brain MRI 08/15/2021. FINDINGS: CT HEAD FINDINGS Brain: Mild generalized cerebral atrophy. Redemonstrated 16 mm acute infarct within the left corona radiata. Background moderate patchy and ill-defined hypoattenuation within the cerebral white matter, nonspecific but compatible with chronic small vessel ischemic disease. There is no acute intracranial hemorrhage. No extra-axial fluid collection. No evidence of an intracranial mass. No midline shift. Vascular: No hyperdense vessel.  Atherosclerotic calcifications. Skull: No fracture or aggressive osseous lesion. Sinuses: Trace mucosal thickening scattered within the bilateral ethmoid sinuses. Orbits: No orbital mass or acute orbital finding. Review of the MIP images confirms the above findings CTA NECK FINDINGS Aortic arch: Common origin of the innominate and left common carotid arteries. The visualized aortic arch is normal in caliber. No hemodynamically significant innominate or proximal subclavian artery stenosis. Right carotid system: CCA and ICA patent within the neck without stenosis. No significant atherosclerotic disease. Left carotid system: CCA and ICA patent within the neck without stenosis. No significant atherosclerotic disease. Vertebral arteries: Vertebral arteries patent within the neck  without hemodynamically significant stenosis. Skeleton: Cervical dextrocurvature. Cervical  spondylosis. Disc space narrowing is advanced at C5-C6, C6-C7 and C7-T1. No appreciable high-grade spinal canal stenosis. Multilevel bony neural foraminal narrowing. No acute fracture or aggressive osseous lesion. Other neck: Nonspecific 10 mm cutaneous/subcutaneous lesion within the left perimandibular soft tissues (series 13, image 237). Upper chest: No consolidation within the imaged lung apices. Review of the MIP images confirms the above findings CTA HEAD FINDINGS Anterior circulation: The intracranial internal carotid arteries are patent. The M1 middle cerebral arteries are patent. No M2 proximal branch occlusion or high-grade proximal stenosis. The anterior cerebral arteries are patent. The right A1 segment is developmentally hypoplastic. 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 1-2 mm inferiorly projecting vascular protrusion arising from the supraclinoid left ICA, which may reflect an infundibulum or aneurysm. Posterior circulation: The intracranial vertebral arteries are patent. The basilar artery is patent. The posterior cerebral arteries are patent. The right PCA is fetal in origin. The left posterior communicating artery is diminutive or absent. Mild focal stenosis within the left PCA at the P1/P2 junction. Venous sinuses: Within the limitations of contrast timing, no convincing thrombus. Anatomic variants: As described Review of the MIP images confirms the above findings IMPRESSION: CT head: 1. Redemonstrated 16 mm acute infarct within the left corona radiata. 2. Background moderate chronic small vessel ischemic changes within the cerebral white matter. 3. Mild generalized cerebral atrophy. CTA neck: 1. The common carotid, internal carotid and vertebral arteries are patent within the neck without stenosis or significant atherosclerotic disease. 2. Cervical spondylosis and cervical dextrocurvature. 3.  Nonspecific 10 mm cutaneous/subcutaneous lesion within the left perimandibular soft tissues. Direct visualization recommended. CTA head: 1. No intracranial large vessel occlusion is identified. 2. 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 3. Mild focal stenosis within the left PCA at the P1/P2 junction. Electronically Signed   By: Jackey Loge D.O.   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 with R hemiparesis ELOS 16-20 days min A Patient able to shower.  2. DVT Prophylaxis/Anticoagulation: Pharmaceutical: Lovenox             On ASA and Plavix- Plavix for 21 days 2. Pain Management: will schedule tylenol 325 mg q6 hours and con't prn as well- might need tramadol, however makes her constipated 3. Mood: Hx of anxiety > depression- will con't Prozac that she's been on for 20 years- might need to increase it and might need to see Neuropsychology.  4. Patient is not able to make decisions for herself due to stroke and per family, recent reduction in mentation.  5. HTN- patient's BP has been running higher- on Norvasc for BP meds- will monitor for trend 6. HLD- will continue statin that was started per Neurology 7. L sacral ala fracture- is WBAT on B/L LE's, however will treat pain, since tx is conservative. Might require Tramadol.  8. Constipation- is chronic for pt- will schedule Miralax QOD for now; however if add tramadol, will make daily.  9. Chronic foley due to bladder spasms- just changed on day of admission- pt accidentally pulled it out- will try to use leg bag as much as possible.  10.  Recent kyphoplasty 07/08/21- T8/T12/L1 for compression fractures.  11. Osteoporosis/Vit D Deficiency- con't Vit D and monitor for falls- since  has hx of frequent falls.  12. Sundowning at home- will monitor for behavior- might need something for sundowning.           Patient Active Problem List    Diagnosis Date Noted   Acute CVA (cerebrovascular accident) (HCC) 08/15/2021   Falls frequently 08/15/2021   Sacral fracture (HCC) 08/15/2021   HTN (hypertension) 08/15/2021   Anxiety 08/15/2021   Osteoporosis 08/15/2021      I have personally performed a face to face diagnostic evaluation of this patient and formulated the plan.  Additionally, I have personally reviewed laboratory data, imaging studies, as well as relevant notes and concur with the physician assistant's documentation above.   The patient's status has not changed from the original H&P.  Any changes in documentation from the acute care chart have been noted above.     I spent a total of 1 hour 35 minutes on this admission myself; with review of chart, exam and doing 75% of H&P- Also called pt's daughter Thurston Hole and discussed Geophysical data processor and appropriate clothing for therapy - also personally explained she will likely need SNF for a few weeks after CIR to get back to mod I after d/c.        Tressia Miners 08/17/2021, 10:56 AM

## 2021-08-17 NOTE — PMR Pre-admission (Signed)
PMR Admission Coordinator Pre-Admission Assessment  Patient: Marie Mitchell is an 88 y.o., female MRN: [id number] DOB: 1933-07-14 Height:   Weight:    Insurance Information HMO:     PPO:      PCP:      IPA:      80/20: yes     OTHER:  PRIMARY: Medicare A and B      Policy#: [id number]      Subscriber: Pt  Phone#: Verified online    Fax#:  Pre-Cert#:       Employer:  Benefits:  Phone #:      Name:  Eff. Date: Parts A  effective 07/22/00 ad B effective 09/21/2009  Deduct: $1600      Out of Pocket Max:  None      Life Max: N/A  CIR: 100%      SNF: 100 days Outpatient: 80%     Co-Pay: 20% Home Health: 100%      Co-Pay: none DME: 80%     Co-Pay: 20% Providers: patient's choice  SECONDARY: BCBS Supplement       Policy#: [id number]     Phone#:   Financial Counselor:       Phone#:   The "Data Collection Information Summary" for patients in Inpatient Rehabilitation Facilities with attached "Privacy Act [place] Records" was provided and verbally reviewed with: Pt and Family   Emergency Contact Information Contact Information     Name Relation Home Work [place] Daughter [phone]  [phone]   Azzie Roup [phone]         Current Medical History  Patient Admitting Diagnosis: CVA, Sacral Fx History of Present Illness: Marie Mitchell is a 88 y.o. female with medical history significant of osteoporosis, vitamin d deficiency, anxiety. Presenting with fall. 2 days PTA, Pt fell after closing her blinds in her room. Unclear if she had and head injury or LOC. She decided not to come to ED. Pt. Presented to the ED 08/15/21 due to increasing weakness, slurred speech, and choking on water. 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 MRI of the sacrum showed Acute nondisplaced fracture of the inferior aspect of the left sacral ala extending into the S3  vertebral body with associated marrow edema and adjacent soft tissue edema and Mild muscle edema in the left piriformis muscle likely reflecting reactive edema secondary to the adjacent sacral fracture versus mild muscle strain. Pt. Admitted for stroke workup and PT/OT/SLP saw Pt. With recommendation of CIR to assist return to PLOF.   Complete NIHSS TOTAL: 3  Patient's medical record from [facility] has been reviewed by the rehabilitation admission coordinator and physician.  Past Medical History  History reviewed. No pertinent past medical history.  Has the patient had major surgery during 100 days prior to admission? Yes  Family History   family history is not on file.  Current Medications  Current Facility-Administered Medications:    acetaminophen (TYLENOL) tablet 650 mg, 650 mg, Oral, Q4H PRN, 650 mg at 08/17/21 0020 **OR** acetaminophen (TYLENOL) 160 MG/5ML solution 650 mg, 650 mg, Per Tube, Q4H PRN **OR** acetaminophen (TYLENOL) suppository 650 mg, 650 mg, Rectal, Q4H PRN, Marylyn Ishihara, Tyrone A, DO   amLODipine (NORVASC) tablet 5 mg, 5 mg, Oral, Daily, Kc, Ramesh, MD, 5 mg at 08/17/21 0836   aspirin EC tablet 81 mg, 81 mg, Oral, Daily, Kc, Ramesh, MD, 81 mg at  08/17/21 0836   atorvastatin (LIPITOR) tablet 40 mg, 40 mg, Oral, Daily, Kyle, Tyrone A, DO, 40 mg at 08/17/21 0836   calcium-vitamin D (OSCAL WITH D) 500-5 MG-MCG per tablet 1 tablet, 1 tablet, Oral, QHS, Shah, Pratik D, DO   calcium-vitamin D (OSCAL WITH D) 500-5 MG-MCG per tablet 2 tablet, 2 tablet, Oral, Q breakfast, Manuella Ghazi, Pratik D, DO, 2 tablet at 08/17/21 4128   Chlorhexidine Gluconate Cloth 2 % PADS 6 each, 6 each, Topical, Daily, Heath Lark D, DO, 6 each at 08/16/21 1600   cholecalciferol (VITAMIN D) tablet 1,000 Units, 1,000 Units, Oral, QHS, Kyle, Tyrone A, DO, 1,000 Units at 08/16/21 2124   clopidogrel (PLAVIX) tablet 75 mg, 75 mg, Oral, Daily, Kyle, Tyrone A, DO, 75 mg at 08/17/21 0836   enoxaparin  (LOVENOX) injection 40 mg, 40 mg, Subcutaneous, Q24H, Kc, Ramesh, MD, 40 mg at 08/17/21 0836   FLUoxetine (PROZAC) capsule 30 mg, 30 mg, Oral, Daily, Kyle, Tyrone A, DO, 30 mg at 08/17/21 0836   senna-docusate (Senokot-S) tablet 1 tablet, 1 tablet, Oral, QHS PRN, Marylyn Ishihara, Tyrone A, DO  Patients Current Diet:  Diet Order             Diet regular Room service appropriate? Yes; Fluid consistency: Thin  Diet effective now                   Precautions / Restrictions Precautions Precautions: Fall Restrictions Weight Bearing Restrictions: No   Has the patient had 2 or more falls or a fall with injury in the past year? Yes  Prior Activity Level Limited Community (1-2x/wk): Pt. went out for appts  Prior Functional Level Self Care: Did the patient need help bathing, dressing, using the toilet or eating? Needed some help  Indoor Mobility: Did the patient need assistance with walking from room to room (with or without device)? Independent  Stairs: Did the patient need assistance with internal or external stairs (with or without device)? Needed some help  Functional Cognition: Did the patient need help planning regular tasks such as shopping or remembering to take medications? Needed some help  Patient Information Are you of Hispanic, Latino/a,or Spanish origin?: A. No, not of Hispanic, Latino/a, or Spanish origin What is your race?: A. White Do you need or want an interpreter to communicate with a doctor or health care staff?: 0. No  Patient's Response To:  Health Literacy and Transportation Is the patient able to respond to health literacy and transportation needs?: Yes Health Literacy - How often do you need to have someone help you when you read instructions, pamphlets, or other written material from your doctor or pharmacy?: Sometimes In the past 12 months, has lack of transportation kept you from medical appointments or from getting medications?: No In the past 12 months, has  lack of transportation kept you from meetings, work, or from getting things needed for daily living?: No  Home Assistive Devices / Equipment Home Equipment: Conservation officer, nature (2 wheels), Grab bars - toilet, Grab bars - tub/shower, Hand held shower head, Rollator (4 wheels)  Prior Device Use: Indicate devices/aids used by the patient prior to current illness, exacerbation or injury? Walker  Current Functional Level Cognition  Overall Cognitive Status: Impaired/Different from baseline Current Attention Level: Selective Orientation Level: Oriented to person, Oriented to place, Oriented to time, Disoriented to situation Following Commands: Follows one step commands inconsistently General Comments: Demonstrating selective attention to perform ADLs in distracting environment. Unable to divide to perform two tasks  together.    Extremity Assessment (includes Sensation/Coordination)  Upper Extremity Assessment: Defer to OT evaluation RUE Deficits / Details: Decreased pinch/grasp strength and in-hand manipulation as seen during oral care. Decreased coordination RUE Sensation: WNL RUE Coordination: decreased fine motor, decreased gross motor LUE Deficits / Details: decreased strength and coorindation LUE Sensation: WNL LUE Coordination: decreased fine motor, decreased gross motor  Lower Extremity Assessment: RLE deficits/detail, LLE deficits/detail RLE Deficits / Details: grossly 3/5 RLE Coordination: decreased gross motor LLE Deficits / Details: grossly 3/5 LLE Coordination: decreased gross motor    ADLs  Overall ADL's : Needs assistance/impaired Eating/Feeding: NPO Grooming: Wash/dry hands, Wash/dry face, Min guard, Sitting Upper Body Bathing: Minimal assistance, Sitting Lower Body Bathing: Sitting/lateral leans, Maximal assistance Upper Body Dressing : Minimal assistance, Sitting Lower Body Dressing: Maximal assistance, Sit to/from stand Toilet Transfer: Moderate assistance, Stand-pivot,  BSC/3in1, Rolling walker (2 wheels) Functional mobility during ADLs: Minimal assistance, Maximal assistance, Rolling walker (2 wheels) (Initially Max A for correcting posterior lean.) General ADL Comments: Pt presenting with decreased coordination, strength, balance, and processing.    Mobility  Overal bed mobility: Needs Assistance Bed Mobility: Supine to Sit Supine to sit: Min assist General bed mobility comments: If given time, pt can move to EOB without much assist.  Did assist to get hips to EOB with use of pad once pt was sitting with LEs off bed.    Transfers  Overall transfer level: Needs assistance Equipment used: Rolling walker (2 wheels) Transfers: Sit to/from Stand, Bed to chair/wheelchair/BSC Sit to Stand: Mod assist, Max assist, From elevated surface Bed to/from chair/wheelchair/BSC transfer type:: Step pivot Stand pivot transfers: Mod assist Step pivot transfers: Mod assist, Max assist General transfer comment: Mod to max A to power up into standing and weight shift forward.  Pt with forward lean onto RW and hips shifted to her right side. Pt had diffifulty with anterior weight shift and keeps buttocks posterior to body. Also with narrow BOS and would not widen it even wtih commands.  She was able to step around to the chair with mod to max assist.    Ambulation / Gait / Stairs / Wheelchair Mobility       Posture / Balance Dynamic Sitting Balance Sitting balance - Comments: Can sit EOB with supervision Balance Overall balance assessment: Needs assistance Sitting-balance support: Feet supported, Bilateral upper extremity supported Sitting balance-Leahy Scale: Fair Sitting balance - Comments: Can sit EOB with supervision Postural control: Posterior lean Standing balance support: Reliant on assistive device for balance, During functional activity, Bilateral upper extremity supported, No upper extremity supported Standing balance-Leahy Scale: Poor Standing balance comment:  Heavy posterior lean. Able to achieve better posture with cues, assist and weight shifting manually by therapist.    Special needs/care consideration Skin intact   Previous Home Environment (from acute therapy documentation) Living Arrangements: Alone, Other (Comment)  Lives With: Other (Comment) Available Help at Discharge: Family, Available PRN/intermittently, Skilled Nursing Facility Type of Home: [place] Name: [place]: One level Home Access: Level entry Bathroom Shower/Tub: Multimedia programmer: Handicapped height Bathroom Accessibility: Yes How Accessible: Accessible via wheelchair, Accessible via [place]: No Additional Comments: Uses 4WRW to walk to common dinning room.  Discharge Living Setting Plans for Discharge Living Setting: Other (Comment), Apartment Type of Home at Discharge: [place] Name at Discharge: [facility] Discharge Home Layout: One level Discharge Home Access: Level entry Discharge Bathroom Shower/Tub: Walk-in shower Discharge Bathroom Toilet:  Handicapped height Discharge Bathroom Accessibility: Yes How Accessible: Accessible via walker Does the patient have any problems obtaining your medications?: No  Social/Family/Support Systems Patient Roles: Other (Comment) Contact Information: [phone] Anticipated Caregiver: Lelon Frohlich (daughter) Anticipated Caregiver's Contact Information: [phone] Ability/Limitations of Caregiver: Can do min A intermittently Caregiver Availability: Intermittent Discharge Plan Discussed with Primary Caregiver: Yes Is Caregiver In Agreement with Plan?: Yes Does Caregiver/Family have Issues with Lodging/Transportation while Pt is in Rehab?: No  Goals Patient/Family Goal for Rehab: PT/OT/SLP Min A Expected length of stay: 16-18 days Pt/Family Agrees to Admission and willing to participate: Yes Program Orientation Provided & Reviewed  with Pt/Caregiver Including Roles  & Responsibilities: Yes  Decrease burden of Care through IP rehab admission: Specialzed equipment needs, Decrease number of caregivers, Bowel and bladder program, and Patient/family education  Possible need for SNF placement upon discharge: Pt. May d/c to SNF if ALF unable to accommodate and family elects not to take her home. She is amenable to this   Patient Condition: I have reviewed medical records from [facility] , spoken with CM, and patient. I met with patient at the bedside for inpatient rehabilitation assessment.  Patient will benefit from ongoing PT, OT, and SLP, can actively participate in 3 hours of therapy a day 5 days of the week, and can make measurable gains during the admission.  Patient will also benefit from the coordinated team approach during an Inpatient Acute Rehabilitation admission.  The patient will receive intensive therapy as well as Rehabilitation physician, nursing, social worker, and care management interventions.  Due to safety, skin/wound care, disease management, medication administration, pain management, and patient education the patient requires 24 hour a day rehabilitation nursing.  The patient is currently min A with mobility and basic ADLs.  Discharge setting and therapy post discharge at assisted living facility is anticipated.  Patient has agreed to participate in the Acute Inpatient Rehabilitation Program and will admit today.  Preadmission Screen Completed By:  Genella Mech, 08/17/2021 9:43 AM ______________________________________________________________________   Discussed status with Dr. Dagoberto Ligas  on 08/17/21  at 830 and received approval for admission today.  Admission Coordinator:  Genella Mech, CCC-SLP, time 952/Date 08/17/21   Assessment/Plan: Diagnosis: Does the need for close, 24 hr/day Medical supervision in concert with the patient's rehab needs make it unreasonable for this patient to be served  in a less intensive setting? Yes Co-Morbidities requiring supervision/potential complications: sundowning, L BG stroke, recent kyphoplasty for thoracic/lumbar compression fx's, Sacral fx WBAT; HTN, HLD; osteoporosis Due to bladder management, bowel management, safety, skin/wound care, disease management, medication administration, pain management, and patient education, does the patient require 24 hr/day rehab nursing? Yes Does the patient require coordinated care of a physician, rehab nurse, PT, OT, and SLP to address physical and functional deficits in the context of the above medical diagnosis(es)? Yes Addressing deficits in the following areas: balance, endurance, locomotion, strength, transferring, bowel/bladder control, bathing, dressing, feeding, grooming, toileting, and cognition Can the patient actively participate in an intensive therapy program of at least 3 hrs of therapy 5 days a week? Yes The potential for patient to make measurable gains while on inpatient rehab is good Anticipated functional outcomes upon discharge from inpatient rehab: min assist PT, min assist OT, min assist SLP Estimated rehab length of stay to reach the above functional goals is: 16-20 days Anticipated discharge destination: Home 10. Overall Rehab/Functional Prognosis: good   MD Signature:

## 2021-08-18 DIAGNOSIS — I639 Cerebral infarction, unspecified: Secondary | ICD-10-CM | POA: Diagnosis not present

## 2021-08-18 MED ORDER — POLYETHYLENE GLYCOL 3350 17 G PO PACK
17.0000 g | PACK | Freq: Every day | ORAL | Status: DC
  Administered 2021-08-19 – 2021-08-29 (×11): 17 g via ORAL
  Filled 2021-08-18 (×11): qty 1

## 2021-08-18 MED ORDER — TRAMADOL HCL 50 MG PO TABS
50.0000 mg | ORAL_TABLET | Freq: Four times a day (QID) | ORAL | Status: DC | PRN
  Administered 2021-08-23 – 2021-08-28 (×3): 50 mg via ORAL
  Filled 2021-08-18 (×3): qty 1

## 2021-08-18 MED ORDER — TRAZODONE HCL 50 MG PO TABS
25.0000 mg | ORAL_TABLET | Freq: Every day | ORAL | Status: DC
  Administered 2021-08-18 – 2021-08-19 (×2): 25 mg via ORAL
  Filled 2021-08-18 (×2): qty 1

## 2021-08-18 NOTE — Progress Notes (Signed)
PROGRESS NOTE   Subjective/Complaints: Poor sleep- wondering if there's anything that could help that- was so focused on having CVA- couldn't sleep.   A little buttock pain- putting on socks made it much worse.     ROS:  Pt denies SOB, abd pain, CP, N/V/C/D, and vision changes   Objective:   No results found. No results for input(s): WBC, HGB, HCT, PLT in the last 72 hours. No results for input(s): NA, K, CL, CO2, GLUCOSE, BUN, CREATININE, CALCIUM in the last 72 hours.  Intake/Output Summary (Last 24 hours) at 08/18/2021 1228 Last data filed at 08/18/2021 [id number] Gross per 24 hour  Intake 360 ml  Output 900 ml  Net -540 ml        Physical Exam: Vital Signs Blood pressure (!) 153/77, pulse 77, temperature 98.1 F (36.7 C), temperature source Oral, resp. rate 18, height 5\' 7"  (1.702 m), weight 59.8 kg, SpO2 99 %.   General: awake, alert, appropriate, sitting EOB with OT- however R sided/posterior lean; NAD HENT: conjugate gaze; oropharynx moist CV: regular rate; no JVD Pulmonary: CTA B/L; no W/R/R- good air movement GI: soft, NT, ND, (+)BS Psychiatric: appropriate- appears wincing in pain with movement Neurological: alert Genitourinary:    Comments: Has foley in place- medium amber urine in bag Musculoskeletal:     Cervical back: Neck supple. No tenderness.     Comments: RUE- deltoid, biceps, triceps 4+/5; otherwise 5-/5 LUE 5/5  RLE- HF 4+/5 and R PF 4+/5; otherwise 5-/5 LLE- 5-/5 throughout  Skin:    General: Skin is warm and dry.     Comments: L forearm IV- a little blood flash in IV- looks OK otherwise  Neurological:     Mental Status: She is alert.     Comments: Limited historian Intact to light touch in all 4 extremities and face Ox2- however got confused on changing foley as well as why here   Assessment/Plan: 1. Functional deficits which require 3+ hours per day of interdisciplinary therapy in a  comprehensive inpatient rehab setting. Physiatrist is providing close team supervision and 24 hour management of active medical problems listed below. Physiatrist and rehab team continue to assess barriers to discharge/monitor patient progress toward functional and medical goals  Care Tool:  Bathing    Body parts bathed by patient: Right arm, Left arm, Chest, Abdomen, Right upper leg, Left upper leg, Face   Body parts bathed by helper: Right lower leg, Left lower leg, Front perineal area, Buttocks     Bathing assist Assist Level: Maximal Assistance - Patient 24 - 49%     Upper Body Dressing/Undressing Upper body dressing   What is the patient wearing?: Bra, Pull over shirt    Upper body assist Assist Level: Moderate Assistance - Patient 50 - 74%    Lower Body Dressing/Undressing Lower body dressing      What is the patient wearing?: Incontinence brief, Pants     Lower body assist Assist for lower body dressing: Total Assistance - Patient < 25%     Toileting Toileting Toileting Activity did not occur Landscape architect and hygiene only):  (for foley care)  Toileting assist Assist for toileting: Total Assistance -  Patient < 25%     Transfers Chair/bed transfer  Transfers assist     Chair/bed transfer assist level: Moderate Assistance - Patient 50 - 74%     Locomotion Ambulation   Ambulation assist              Walk 10 feet activity   Assist           Walk 50 feet activity   Assist           Walk 150 feet activity   Assist           Walk 10 feet on uneven surface  activity   Assist           Wheelchair     Assist               Wheelchair 50 feet with 2 turns activity    Assist            Wheelchair 150 feet activity     Assist          Blood pressure (!) 153/77, pulse 77, temperature 98.1 F (36.7 C), temperature source Oral, resp. rate 18, height 5\' 7"  (1.702 m), weight 59.8 kg, SpO2 99  %.  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 with R hemiparesis ELOS 16-20 days min A Patient able to shower. 5/28-First day of evaluations- Con't CIR- PT, OT and SLP  2. DVT Prophylaxis/Anticoagulation: Pharmaceutical: Lovenox             On ASA and Plavix- Plavix for 21 days 2. Pain Management: will schedule tylenol 325 mg q6 hours and con't prn as well- might need tramadol, however makes her constipated  5/28- will add tramadol per pt request prn- 50 mg q6 hours prn 3. Mood: Hx of anxiety > depression- will con't Prozac that she's been on for 20 years- might need to increase it and might need to see Neuropsychology.  4. Patient is not able to make decisions for herself due to stroke and per family, recent reduction in mentation.  5. HTN- patient's BP has been running higher- on Norvasc for BP meds- will monitor for trend 6. HLD- will continue statin that was started per Neurology 7. L sacral ala fracture- is WBAT on B/L LE's, however will treat pain, since tx is conservative. Might require Tramadol.  8. Constipation- is chronic for pt- will schedule Miralax QOD for now; however if add tramadol, will make daily.   5/28- will change to daily.  9. Chronic foley due to bladder spasms- just changed on day of admission- pt accidentally pulled it out- will try to use leg bag as much as possible.  10.  Recent kyphoplasty 07/08/21- T8/T12/L1 for compression fractures.  11. Osteoporosis/Vit D Deficiency- con't Vit D and monitor for falls- since has hx of frequent falls.  12. Sundowning at home- will monitor for behavior- might need something for sundowning.  13. Insomnia  5/28- will add Trazodone 25 mg QHS for sleep- might need more, but since 88, will start low.         LOS: 1 days A FACE TO FACE EVALUATION WAS PERFORMED  Olin Gurski 08/18/2021, 12:28 PM

## 2021-08-18 NOTE — Evaluation (Signed)
Occupational Therapy Assessment and Plan  Patient Details  Name: Marie Mitchell MRN: [id number] Date of Birth: 02/02/1934  OT Diagnosis: abnormal posture, acute pain, cognitive deficits, hemiplegia affecting dominant side, lumbago (low back pain), and muscle weakness (generalized) Rehab Potential: Rehab Potential (ACUTE ONLY): Good ELOS: 10-14 days   Today's Date: 08/18/2021 OT Individual Time: 0800-0900 OT Individual Time Calculation (min): 60 min     Hospital Problem: Principal Problem:   Left basal ganglia embolic stroke [facility])   Past Medical History: History reviewed. No pertinent past medical history. Past Surgical History: History reviewed. No pertinent surgical history.  Assessment & Plan Clinical Impression:  Patient is an 88 yo R handed female with past medical history significant for osteoporosis, vitamin d deficiency, frequent falls, chronic Foley, and anxiety. Presented this admission s/p fall at home on 08/12/21, which happened after closing her blinds at home.  Patient is unsure if there was a LOC or head injury.  Initially she did not go to the ED.  However, in the afternoon on 08/13/21 she became increasingly confused with difficulty with seeing and writing. Upon going to the kitchen to prepare dinner her legs became weak and she fell, which resulted in LOC and head injury.  She then went to the ED with her daughter, at which time she had slurred speech and difficulty drinking water without chocking.   MRI brain brain shows acute perforator infarct in the left basal ganglia, extending into overlying corona radiata, with slight edema without mass effect. CT head shows chronic atrophy and small vessel ischemic changes, with no acute intracranial abnormalities.  CTA head negative for large vessel occlusion. 2D echo, normal EF 55-60%. CT left hip was negative for acute hip fracture. Linear lucency and cortical offset in the inferior aspect of the lateral aspect of the sacrum on the  left, possible nondisplaced fracture. MR sacrum demonstrated acute nondisplaced fracture of the inferior aspect of the left sacral ala extending into the S3 vertebral body with associated marrow edema and adjacent soft tissue edema.   Patient's LDL 119; HbA1c 5.3; and EF 55-60%; also NIHSS 5     Her Foley was changed in the ED.  Patient needs to be admitted to CIR for acute decline in level of function: mobility, ADL's, and cognition. Patient transferred to CIR on 08/17/2021 .    Patient currently requires max with basic self-care skills secondary to muscle weakness, decreased cardiorespiratoy endurance, impaired timing and sequencing, decreased coordination, and decreased motor planning, decreased initiation, decreased problem solving, decreased safety awareness, and decreased memory, and decreased sitting balance, decreased standing balance, and hemiplegia.  Prior to hospitalization, patient could complete all self-care with min A provided at ALF.  Patient will benefit from skilled intervention to decrease level of assist with basic self-care skills and increase independence with basic self-care skills prior to discharge to ALF with intermittent supervision/min A.  Anticipate patient will require 24 hour supervision and minimal physical assistance and follow up home health.  OT - End of Session Activity Tolerance: Tolerates < 10 min activity, no significant change in vital signs Endurance Deficit: Yes Endurance Deficit Description: pt with slight SOB during EOB ADLs, however reports "whistling" while working OT Assessment Rehab Potential (ACUTE ONLY): Good OT Barriers to Discharge: Home environment access/layout;Incontinence;Lack of/limited family support OT Patient demonstrates impairments in the following area(s): Balance;Cognition;Endurance;Motor;Pain;Safety;Sensory;Skin Integrity OT Basic ADL's Functional Problem(s): Grooming;Bathing;Dressing;Toileting OT Transfers Functional Problem(s):  Toilet;Tub/Shower OT Additional Impairment(s): None OT Plan OT Intensity: Minimum of 1-2 x/day, 45 to  90 minutes OT Frequency: 5 out of 7 days OT Duration/Estimated Length of Stay: 10-14 days OT Treatment/Interventions: Balance/vestibular training;Discharge planning;Pain management;Self Care/advanced ADL retraining;Therapeutic Activities;UE/LE Coordination activities;Cognitive remediation/compensation;Functional mobility training;Patient/family education;Therapeutic Exercise;Community reintegration;DME/adaptive equipment instruction;Neuromuscular re-education;UE/LE Strength taining/ROM;Wheelchair propulsion/positioning OT Self Feeding Anticipated Outcome(s): Set up A OT Basic Self-Care Anticipated Outcome(s): Min A OT Toileting Anticipated Outcome(s): supervision OT Bathroom Transfers Anticipated Outcome(s): supervision OT Recommendation Recommendations for Other Services: Therapeutic Recreation consult Therapeutic Recreation Interventions: Pet therapy Patient destination: Assisted Living Follow Up Recommendations: Home health OT;24 hour supervision/assistance Equipment Recommended: To be determined   OT Evaluation Precautions/Restrictions  Precautions Precautions: Fall Restrictions Weight Bearing Restrictions: Yes LLE Weight Bearing: Weight bearing as tolerated Home Living/Prior Functioning Home Living Available Help at Discharge: Family, Available PRN/intermittently Type of Home: Assisted living Home Access: Level entry Home Layout: One level Bathroom Shower/Tub: Multimedia programmer: Handicapped height Bathroom Accessibility: Yes Additional Comments: Uses 4WRW to walk to common dinning room.  Lives With: Other (Comment) (ALF) IADL History Homemaking Responsibilities: No Current License: No Prior Function Level of Independence: Needs assistance with ADLs, Requires assistive device for independence, Needs assistance with homemaking Bath: Minimal Dressing:  Supervision/set-up Driving: No Vision Baseline Vision/History: 4 Cataracts;1 Wears glasses (readers only) Ability to See in Adequate Light: 0 Adequate Patient Visual Report: No change from baseline Vision Assessment?: No apparent visual deficits Perception  Perception: Within Functional Limits Praxis Praxis: Intact Cognition Cognition Overall Cognitive Status: No family/caregiver present to determine baseline cognitive functioning Arousal/Alertness: Awake/alert Orientation Level: Person;Place;Situation Person: Oriented Place: Disoriented Situation: Oriented Memory: Appears intact Awareness: Appears intact Problem Solving: Impaired Safety/Judgment: Impaired Brief Interview for Mental Status (BIMS) Repetition of Three Words (First Attempt): 3 Temporal Orientation: Year: Correct Temporal Orientation: Month: Accurate within 5 days Temporal Orientation: Day: Correct Recall: "Sock": Yes, no cue required Recall: "Blue": Yes, no cue required Recall: "Bed": Yes, no cue required BIMS Summary Score: 15 Sensation Sensation Light Touch: Impaired by gross assessment Hot/Cold: Not tested Proprioception: Appears Intact Stereognosis: Not tested Additional Comments: light tough appears intact with testing, however pt reports diminshed sensation and different than baseline Coordination Gross Motor Movements are Fluid and Coordinated: No Fine Motor Movements are Fluid and Coordinated: No Finger Nose Finger Test: slow and decreased precision bilaterally, deficits > on RUE Motor  Motor Motor: Hemiplegia Motor - Skilled Clinical Observations: impaired midline orientation and motor planning especially on R side, posterior/R lean in stance  Trunk/Postural Assessment  Cervical Assessment Cervical Assessment: Exceptions to [facility] (forward head) Thoracic Assessment Thoracic Assessment: Exceptions to [facility] (rounded shoulders in sitting) Lumbar Assessment Lumbar Assessment:  (posterior pelvic  tilt) Postural Control Postural Control: Deficits on evaluation (R lean in sitting)  Balance Balance Balance Assessed: Yes Static Sitting Balance Static Sitting - Balance Support: Feet supported;Bilateral upper extremity supported Static Sitting - Level of Assistance: 5: Stand by assistance (CGA) Dynamic Sitting Balance Dynamic Sitting - Balance Support: Feet supported;No upper extremity supported Dynamic Sitting - Level of Assistance: 4: Min assist Dynamic Sitting - Balance Activities: Reaching for objects Static Standing Balance Static Standing - Balance Support: During functional activity Static Standing - Level of Assistance: 3: Mod assist Dynamic Standing Balance Dynamic Standing - Level of Assistance: 2: Max assist Extremity/Trunk Assessment RUE Assessment RUE Assessment: Within Functional Limits General Strength Comments: 4-/5 grossly LUE Assessment LUE Assessment: Within Functional Limits General Strength Comments: 4+/5 grossly  Care Tool Care Tool Self Care Eating        Oral Care  Oral care, brush teeth, clean  dentures activity did not occur: Refused      Bathing   Body parts bathed by patient: Right arm;Left arm;Chest;Abdomen;Right upper leg;Left upper leg;Face Body parts bathed by helper: Right lower leg;Left lower leg;Front perineal area;Buttocks   Assist Level: Maximal Assistance - Patient 24 - 49%    Upper Body Dressing(including orthotics)   What is the patient wearing?: Bra;Pull over shirt   Assist Level: Moderate Assistance - Patient 50 - 74%    Lower Body Dressing (excluding footwear)   What is the patient wearing?: Incontinence brief;Pants Assist for lower body dressing: Total Assistance - Patient < 25%    Putting on/Taking off footwear   What is the patient wearing?: Non-skid slipper socks Assist for footwear: Dependent - Patient 0%       Care Tool Toileting Toileting activity   Assist for toileting: Total Assistance - Patient < 25%      Care Tool Bed Mobility Roll left and right activity        Sit to lying activity        Lying to sitting on side of bed activity         Care Tool Transfers Sit to stand transfer   Sit to stand assist level: Moderate Assistance - Patient 50 - 74%    Chair/bed transfer         Toilet transfer Toilet transfer activity did not occur:  (simulated to recliner) Assist Level: Maximal Assistance - Patient 24 - 49%     Care Tool Cognition  Expression of Ideas and Wants Expression of Ideas and Wants: 3. Some difficulty - exhibits some difficulty with expressing needs and ideas (e.g, some words or finishing thoughts) or speech is not clear  Understanding Verbal and Non-Verbal Content Understanding Verbal and Non-Verbal Content: 3. Usually understands - understands most conversations, but misses some part/intent of message. Requires cues at times to understand   Memory/Recall Ability Memory/Recall Ability : That he or she is in a hospital/hospital unit;Current season   Refer to Care Plan for Long Term Goals  SHORT TERM GOAL WEEK 1 OT Short Term Goal 1 (Week 1): Pt will use AE as needed to thread BLEs into LB clothing with no more than supervision for sitting balance OT Short Term Goal 2 (Week 1): Pt will complete sit > stand in prep for ADL task with min A using LRAD OT Short Term Goal 3 (Week 1): Pt will use compensatory technqiue to donn bra with supervision and verbal cues as needed OT Short Term Goal 4 (Week 1): Pt will complete 1/3 steps during toileting with no more than mod A for standing balance  Recommendations for other services: Therapeutic Recreation  Pet therapy   Skilled Therapeutic Intervention Skilled OT intervention completed with discussion on POC, rehab goals and explanation of OT purpose. Pt received supine in bed, agreeable to session. Noticeable leakage/spillage all over pt's bed, with no active leak noted at catheter, however notified nursing of findings.  Required assist to donn hearing aids 2/2 report of lack of bilateral finger numbness, however sensation appearing intact with assessment. Transitioned to EOB with min A for BLE and scooting forward. Required up to min A at times for sitting balance 2/2 R lean with feet supported. Completed bathing/dressing at EOB. See caretool for further details on assist level with self-care tasks performed. Sit > stand using RW with mod A, then max A stand pivot to recliner for simulated toilet transfer. Poor postural control, motor planning and initiation on  RLE vs RUE, with max cues needed for safety throughout. Pt left upright in recliner, with BLE elevated, belt alarm on and all needs in reach at end of session.   ADL ADL Eating: Set up Where Assessed-Eating: Bed level Grooming: Minimal assistance Where Assessed-Grooming: Edge of bed Upper Body Bathing: Contact guard Where Assessed-Upper Body Bathing: Edge of bed Lower Body Bathing: Maximal assistance Where Assessed-Lower Body Bathing: Edge of bed Upper Body Dressing: Moderate assistance Where Assessed-Upper Body Dressing: Edge of bed Lower Body Dressing: Maximal assistance Where Assessed-Lower Body Dressing: Edge of bed Toileting: Dependent Where Assessed-Toileting: Bedside Commode Toilet Transfer: Maximal assistance (simulated to recliner) Toilet Transfer Method: Stand pivot Tub/Shower Transfer: Unable to assess Tub/Shower Transfer Method: Unable to assess Social research officer, government: Unable to assess Social research officer, government Method: Unable to assess Mobility  Bed Mobility Bed Mobility: Supine to Sit;Sitting - Scoot to Edge of Bed Supine to Sit: Minimal Assistance - Patient > 75% Sitting - Scoot to Edge of Bed: Minimal Assistance - Patient > 75% Transfers Sit to Stand: Moderate Assistance - Patient 50-74%   Discharge Criteria: Patient will be discharged from OT if patient refuses treatment 3 consecutive times without medical reason, if treatment  goals not met, if there is a change in medical status, if patient makes no progress towards goals or if patient is discharged from hospital.  The above assessment, treatment plan, treatment alternatives and goals were discussed and mutually agreed upon: by patient  Blase Mess 08/18/2021, 9:08 AM

## 2021-08-18 NOTE — Progress Notes (Signed)
Inpatient Rehabilitation Admission Medication Review by a Pharmacist  A complete drug regimen review was completed for this patient to identify any potential clinically significant medication issues.  High Risk Drug Classes Is patient taking? Indication by Medication  Antipsychotic No   Anticoagulant No   Antibiotic No   Opioid No   Antiplatelet Yes Aspirin 81 mg daily and clopidogrel 75 mg daily for stroke  Hypoglycemics/insulin No   Vasoactive Medication Yes Amlodipine 5 mg daily for hypertension (new since acute admission)  Chemotherapy No   Other Yes Fluoxetine 30 mg daily for anxiety (takes 20 mg with 10 mg capsule)     Type of Medication Issue Identified Description of Issue Recommendation(s)  Drug Interaction(s) (clinically significant)  None N/A  Duplicate Therapy  None N/A  Allergy  None N/A  No Medication Administration End Date  Clopidogrel 75 mg daily x 21 days but no end date placed Add clopidogrel end date (patient's last dose to be on 09/06/21)  Incorrect Dose  None N/A  Additional Drug Therapy Needed  None N/A  Significant med changes from prior encounter (inform family/care partners about these prior to discharge). New amlodipine, clopidogrel, aspirin, and atorvastatin from acute admission Counsel patient, family, care partners on new medications and the clopidogrel stop date (if patient discharged from [place] before 09/06/21)  Other  Tumeric PO 500 mg BID and vitamin B-12 PO 1000 mcg daily on acute admission's discharge summary but not ordered in CIR Resume tumeric upon discharge from CIR and reorder vitamin B-12 if a clinical indication is present    Clinically significant medication issues were identified that warrant physician communication and completion of prescribed/recommended actions by midnight of the next day:  No  Name of provider notified for urgent issues identified: N/A  Provider Method of Notification: N/A  Time spent performing this drug regimen  review (minutes):  15 minutes   Shauna Hugh, PharmD, [place]  PGY-2 Pharmacy Resident 08/18/2021 6:55 AM  Please check [email or web address] for unit-specific pharmacy phone numbers.

## 2021-08-18 NOTE — Plan of Care (Signed)
  Problem: RH BLADDER ELIMINATION Goal: RH STG MANAGE BLADDER WITH EQUIPMENT WITH ASSISTANCE Description: STG Manage Bladder With Equipment With max Assistance Outcome: Progressing   Problem: RH SKIN INTEGRITY Goal: RH STG SKIN FREE OF INFECTION/BREAKDOWN Description: No new skin breakdown this admission Outcome: Progressing Goal: RH STG MAINTAIN SKIN INTEGRITY WITH ASSISTANCE Description: STG Maintain Skin Integrity With min Assistance. Outcome: Progressing   Problem: RH SAFETY Goal: RH STG ADHERE TO SAFETY PRECAUTIONS W/ASSISTANCE/DEVICE Description: STG Adhere to Safety Precautions With min Assistance/Device. Outcome: Progressing   Problem: RH COGNITION-NURSING Goal: RH STG USES MEMORY AIDS/STRATEGIES W/ASSIST TO PROBLEM SOLVE Description: STG Uses Memory Aids/Strategies With min Assistance to Problem Solve. Outcome: Progressing Goal: RH STG ANTICIPATES NEEDS/CALLS FOR ASSIST W/ASSIST/CUES Description: STG Anticipates Needs/Calls for min Assist With Assistance/Cues. Outcome: Progressing   Problem: RH PAIN MANAGEMENT Goal: RH STG PAIN MANAGED AT OR BELOW PT'S PAIN GOAL Description: Less than 3 out of 10 Outcome: Progressing

## 2021-08-18 NOTE — Evaluation (Signed)
Physical Therapy Assessment and Plan  Patient Details  Name: Marie Mitchell MRN: [id number] Date of Birth: 04-Jun-1933  PT Diagnosis: Abnormal posture, Abnormality of gait, Cognitive deficits, Difficulty walking, Edema, and Hemiplegia dominant Rehab Potential: Good ELOS: 10-14 days   Today's Date: 08/18/2021 PT Individual Time: 1010-1110 PT Individual Time Calculation (min): 60 min    Hospital Problem: Principal Problem:   Left basal ganglia embolic stroke [facility])   Past Medical History: History reviewed. No pertinent past medical history. Past Surgical History: History reviewed. No pertinent surgical history.  Assessment & Plan Clinical Impression: Patient is a 88 y.o. year old R handed female with past medical history significant for osteoporosis, vitamin d deficiency, frequent falls, chronic Foley, and anxiety. Presented this admission s/p fall at home on 08/12/21, which happened after closing her blinds at home.  Patient is unsure if there was a LOC or head injury.  Initially she did not go to the ED.  However, in the afternoon on 08/13/21 she became increasingly confused with difficulty with seeing and writing. Upon going to the kitchen to prepare dinner her legs became weak and she fell, which resulted in LOC and head injury.  She then went to the ED with her daughter, at which time she had slurred speech and difficulty drinking water without chocking.   MRI brain brain shows acute perforator infarct in the left basal ganglia, extending into overlying corona radiata, with slight edema without mass effect. CT head shows chronic atrophy and small vessel ischemic changes, with no acute intracranial abnormalities.  CTA head negative for large vessel occlusion. 2D echo, normal EF 55-60%. CT left hip was negative for acute hip fracture. Linear lucency and cortical offset in the inferior aspect of the lateral aspect of the sacrum on the left, possible nondisplaced fracture. MR sacrum demonstrated  acute nondisplaced fracture of the inferior aspect of the left sacral ala extending into the S3 vertebral body with associated marrow edema and adjacent soft tissue edema. Patient transferred to CIR on 08/17/2021 .   Patient currently requires mod-max with mobility secondary to muscle weakness, decreased cardiorespiratoy endurance, impaired timing and sequencing, decreased coordination, and decreased motor planning, decreased attention to right, decreased awareness, decreased problem solving, decreased safety awareness, and decreased memory, and decreased sitting balance, decreased standing balance, decreased postural control, hemiplegia, and decreased balance strategies.  Prior to hospitalization, patient was modified independent  with mobility with Rollator with and min A for ADLs lived alone in a Assisted living home.  Home access is  Level entry.  Patient will benefit from skilled PT intervention to maximize safe functional mobility, minimize fall risk, and decrease caregiver burden for planned discharge home with intermittent assist, may need 24/7 supervision if cognition does not improve.  Anticipate patient will benefit from follow up [place] at discharge.  PT - End of Session Activity Tolerance: Tolerates 30+ min activity with multiple rests Endurance Deficit: Yes Endurance Deficit Description: pt with slight SOB during EOB ADLs, however reports "whistling" while working PT Assessment Rehab Potential (ACUTE/IP ONLY): Good PT Barriers to Discharge: Decreased caregiver support;Lack of/limited family support;Behavior PT Barriers to Discharge Comments: Returning to ALF with intermittent assist, may have some cognitive deficits impairing safety awareness and awareness of deficits limited ability to left alone at d/c PT Patient demonstrates impairments in the following area(s): Balance;Behavior;Edema;Endurance;Motor;Nutrition;Pain;Perception;Safety;Sensory;Skin Integrity PT Transfers Functional  Problem(s): Bed Mobility;Bed to Chair;Car;Furniture PT Locomotion Functional Problem(s): Ambulation;Wheelchair Mobility PT Plan PT Intensity: Minimum of 1-2 x/day ,45 to 90 minutes PT Frequency:  5 out of 7 days PT Duration Estimated Length of Stay: 10-14 days PT Treatment/Interventions: Ambulation/gait training;Cognitive remediation/compensation;Discharge planning;DME/adaptive equipment instruction;Functional mobility training;Pain management;Psychosocial support;Splinting/orthotics;Therapeutic Activities;UE/LE Strength taining/ROM;Visual/perceptual remediation/compensation;Wheelchair propulsion/positioning;UE/LE Coordination activities;Therapeutic Exercise;Stair training;Skin care/wound management;Patient/family education;Neuromuscular re-education;Functional electrical stimulation;Disease management/prevention;Community reintegration;Balance/vestibular training PT Transfers Anticipated Outcome(s): Supervision using LRAD PT Locomotion Anticipated Outcome(s): Supervision using LRAD PT Recommendation Follow Up Recommendations: Home health PT Patient destination: Assisted Living Equipment Recommended: Rolling walker with 5" wheels Equipment Details: pt uses rollator at baseline, may need RW for increased stability at d/c   PT Evaluation Precautions/Restrictions Precautions Precautions: Fall Restrictions Weight Bearing Restrictions: Yes LUE Weight Bearing: Weight bearing as tolerated LLE Weight Bearing: Weight bearing as tolerated Pain Interference Pain Interference Pain Effect on Sleep: 2. Occasionally Pain Interference with Therapy Activities: 0. Does not apply - I have not received rehabilitationtherapy in the past 5 days Pain Interference with Day-to-Day Activities: 2. Occasionally Home Living/Prior Functioning Home Living Available Help at Discharge: Family;Available PRN/intermittently Type of Home: Assisted living Home Access: Level entry Home Layout: One level Bathroom  Accessibility: Yes Additional Comments: Uses 4WRW to walk to common dinning room.  Lives With: Other (Comment) (ALF) Prior Function Level of Independence: Needs assistance with ADLs;Requires assistive device for independence;Needs assistance with homemaking;Independent with gait;Independent with transfers Dressing: Unable to assess Driving: No Vision/Perception  Vision - History Ability to See in Adequate Light: 0 Adequate Perception Perception: Within Functional Limits Praxis Praxis: Intact  Cognition Overall Cognitive Status: No family/caregiver present to determine baseline cognitive functioning Arousal/Alertness: Awake/alert Orientation Level: Oriented to person;Oriented to situation;Oriented to place Memory: Impaired Memory Impairment: Decreased recall of new information Awareness: Appears intact Problem Solving: Impaired Problem Solving Impairment: Verbal basic;Functional basic Safety/Judgment: Impaired Sensation Sensation Light Touch: Appears Intact Hot/Cold: Not tested Proprioception: Appears Intact Stereognosis: Not tested Coordination Gross Motor Movements are Fluid and Coordinated: No Fine Motor Movements are Fluid and Coordinated: No Coordination and Movement Description: hypometric movements with poor motor planning and postural control Heel Shin Test: slow and decreased precision bilaterally, deficits > on RUE Motor  Motor Motor: Hemiplegia;Abnormal postural alignment and control Motor - Skilled Clinical Observations: impaired midline orientation and motor planning especially on R side, posterior/R lean in stance   Trunk/Postural Assessment  Cervical Assessment Cervical Assessment: Exceptions to [facility] (forward head) Thoracic Assessment Thoracic Assessment: Exceptions to [facility] (rounded shoulders in sitting) Lumbar Assessment Lumbar Assessment: Exceptions to [facility] (posterior pelvic tilt) Postural Control Postural Control: Deficits on evaluation (R lean in  sitting)  Balance Balance Balance Assessed: Yes Standardized Balance Assessment Standardized Balance Assessment: Berg Balance Test Berg Balance Test Sit to Stand: Needs moderate or maximal assist to stand Standing Unsupported: Unable to stand 30 seconds unassisted Sitting with Back Unsupported but Feet Supported on Floor or Stool: Able to sit safely and securely 2 minutes Stand to Sit: Needs assistance to sit Transfers: Needs one person to assist Standing Unsupported with Eyes Closed: Needs help to keep from falling Standing Ubsupported with Feet Together: Needs help to attain position and unable to hold for 15 seconds From Standing, Reach Forward with Outstretched Arm: Loses balance while trying/requires external support From Standing Position, Pick up Object from Floor: Unable to try/needs assist to keep balance From Standing Position, Turn to Look Behind Over each Shoulder: Needs assist to keep from losing balance and falling Turn 360 Degrees: Needs assistance while turning Standing Unsupported, Alternately Place Feet on Step/Stool: Needs assistance to keep from falling or unable to try Standing Unsupported, One Foot in Front: Loses balance  while stepping or standing Standing on One Leg: Unable to try or needs assist to prevent fall Total Score: 5 Static Sitting Balance Static Sitting - Balance Support: Feet supported;No upper extremity supported Static Sitting - Level of Assistance: 5: Stand by assistance (CGA) Dynamic Sitting Balance Dynamic Sitting - Balance Support: Feet supported;No upper extremity supported Dynamic Sitting - Level of Assistance: 4: Min assist Dynamic Sitting - Balance Activities: Reaching for objects Static Standing Balance Static Standing - Balance Support: During functional activity Static Standing - Level of Assistance: 3: Mod assist Dynamic Standing Balance Dynamic Standing - Level of Assistance: 2: Max assist;3: Mod assist Extremity Assessment  RLE  Assessment RLE Assessment: Within Functional Limits Active Range of Motion (AROM) Comments: WFL for all functional mobility General Strength Comments: Grossly 5/5 throughout in sitting LLE Assessment LLE Assessment: Within Functional Limits Active Range of Motion (AROM) Comments: WFL for all functional mobility General Strength Comments: Grossly 5/5 throughout in sitting  Care Tool Care Tool Bed Mobility Roll left and right activity   Roll left and right assist level: Minimal Assistance - Patient > 75%    Sit to lying activity   Sit to lying assist level: Minimal Assistance - Patient > 75%    Lying to sitting on side of bed activity   Lying to sitting on side of bed assist level: the ability to move from lying on the back to sitting on the side of the bed with no back support.: Minimal Assistance - Patient > 75%     Care Tool Transfers Sit to stand transfer   Sit to stand assist level: Moderate Assistance - Patient 50 - 74%    Chair/bed transfer   Chair/bed transfer assist level: Maximal Assistance - Patient 25 - 49%     Toilet transfer Toilet transfer activity did not occur:  (simulated to recliner) Assist Level: Maximal Assistance - Patient 24 - 49%    Car transfer   Car transfer assist level: Moderate Assistance - Patient 50 - 74%      Care Tool Locomotion Ambulation   Assist level: Maximal Assistance - Patient 25 - 49% Assistive device: Hand held assist Max distance: 2 ft  Walk 10 feet activity Walk 10 feet activity did not occur: Safety/medical concerns       Walk 50 feet with 2 turns activity Walk 50 feet with 2 turns activity did not occur: Safety/medical concerns      Walk 150 feet activity Walk 150 feet activity did not occur: Safety/medical concerns      Walk 10 feet on uneven surfaces activity Walk 10 feet on uneven surfaces activity did not occur: Safety/medical concerns      Stairs Stair activity did not occur: N/A (pt does not perform stairs at  baseline)        Walk up/down 1 step activity Walk up/down 1 step or curb (drop down) activity did not occur: N/A      Walk up/down 4 steps activity Walk up/down 4 steps activity did not occur: N/A      Walk up/down 12 steps activity Walk up/down 12 steps activity did not occur: N/A      Pick up small objects from floor   Pick up small object from the floor assist level: Dependent - Patient 0%    Wheelchair Is the patient using a wheelchair?: Yes Type of Wheelchair: Manual   Wheelchair assist level: Total Assistance - Patient < 25% Max wheelchair distance: >150 ft  Wheel 50 feet with  2 turns activity   Assist Level: Total Assistance - Patient < 25%  Wheel 150 feet activity   Assist Level: Total Assistance - Patient < 25%    Refer to Care Plan for Long Term Goals  SHORT TERM GOAL WEEK 1 PT Short Term Goal 1 (Week 1): Patient will perform transfers consistently with min A using LRAD PT Short Term Goal 2 (Week 1): Patient will ambulate >200 ft using LRAD with CGA consistently PT Short Term Goal 3 (Week 1): Patient will improve score on Berg Balance Scale by 7 points to meet MCID  Recommendations for other services: Therapeutic Recreation  Stress management  Skilled Therapeutic Intervention Evaluation completed (see details above and below) with education on PT POC and goals and individual treatment initiated with focus on functional mobility/transfers, LE strength, dynamic standing balance/coordination, ambulation, stair navigation, simulated car transfers, and improved endurance with activity Patient provided with 16"x16" wheelchair with Roho cushion and adjustments made to promote optimal seating posture and pressure distribution. Patient also provided with RW for use in room and therapist adjusted to proper height for patient.  Patient in recliner in the room upon PT arrival. Patient alert and agreeable to PT session. Patient reported 6/10 pelvic pain from sitting in the  recliner during session, RN made aware. PT provided repositioning, rest breaks, and distraction as pain interventions throughout session.   Therapeutic Activity: Bed Mobility: Patient performed rolling R/L and supine to/from sit with min A in a flat bed without use of bed rails.  Transfers: Patient performed sit to/from stand and stand pivot x1 with mod A without an AD, intermittent max A during pivot due to posterior LOB and decreased motor planning. She performed sit to/from stand x4 and stand pivot x1 with min A using RW. Provided verbal cues for hand placement, forward weight shift, completion of turns prior to sitting, and reaching back to sit. Patient performed a simulated sedan height car transfer with mod-min A using RW.   Gait Training:  Patient ambulated 2 feet using HHA with max A with posterior bias. She then ambulated >150 feet x2 using RW with mod progressing to min A-CGA with intermittent assist with AD management with veering R or L. Ambulated with decreased gait speed, decreased step length and height, decreased L weight shift, narrow BOS with scissoring on turns intermittently, mild forward trunk lean, and downward head gaze. Provided verbal cues for erect posture, increased step length, increased BOS, and visual scanning to correct veering.  Wheelchair Mobility:  Patient propelled wheelchair >150 feet with total A, patient with very small strokes with minimal to no forward propulsion without assist.  Neuromuscular Re-ed: Patient demonstrated increased fall risk noted by score of 5/56 on the Berg Balance Scale.  <45/56 = fall risk, <42/56 = predictive of recurrent falls, <40/56 = 100% fall risk  >41 = independent, 21-40 = assistive device, 0-20 = wheelchair level  MDC 6.9 (4 pts 45-56, 5 pts 35-44, 7 pts 25-34) (ANPTA Core Set of Outcome Measures for Adults with Neurologic Conditions, 2018)  Instructed pt in results of PT evaluation as detailed above, PT POC, rehab potential,  rehab goals, and discharge recommendations. Additionally discussed CIR's policies regarding fall safety and use of chair alarm and/or quick release belt. Pt verbalized understanding and in agreement. Will update pt's family members as they become available.   Patient in bed due to fatigue at end of session with breaks locked, bed alarm set, and all needs within reach.    Discharge  Criteria: Patient will be discharged from PT if patient refuses treatment 3 consecutive times without medical reason, if treatment goals not met, if there is a change in medical status, if patient makes no progress towards goals or if patient is discharged from hospital.  The above assessment, treatment plan, treatment alternatives and goals were discussed and mutually agreed upon: by patient  Doreene Burke PT, DPT  08/18/2021, 12:56 PM

## 2021-08-19 ENCOUNTER — Encounter (HOSPITAL_COMMUNITY): Payer: Self-pay | Admitting: Physical Medicine and Rehabilitation

## 2021-08-19 DIAGNOSIS — I639 Cerebral infarction, unspecified: Secondary | ICD-10-CM | POA: Diagnosis not present

## 2021-08-19 DIAGNOSIS — K5900 Constipation, unspecified: Secondary | ICD-10-CM

## 2021-08-19 DIAGNOSIS — I10 Essential (primary) hypertension: Secondary | ICD-10-CM | POA: Diagnosis not present

## 2021-08-19 DIAGNOSIS — M533 Sacrococcygeal disorders, not elsewhere classified: Secondary | ICD-10-CM

## 2021-08-19 DIAGNOSIS — R7989 Other specified abnormal findings of blood chemistry: Secondary | ICD-10-CM | POA: Diagnosis not present

## 2021-08-19 LAB — CBC WITH DIFFERENTIAL/PLATELET
Abs Immature Granulocytes: 0.01 10*3/uL (ref 0.00–0.07)
Basophils Absolute: 0 10*3/uL (ref 0.0–0.1)
Basophils Relative: 1 %
Eosinophils Absolute: 0.2 10*3/uL (ref 0.0–0.5)
Eosinophils Relative: 6 %
HCT: 39.2 % (ref 36.0–46.0)
Hemoglobin: 12.9 g/dL (ref 12.0–15.0)
Immature Granulocytes: 0 %
Lymphocytes Relative: 25 %
Lymphs Abs: 0.8 10*3/uL (ref 0.7–4.0)
MCH: 30.9 pg (ref 26.0–34.0)
MCHC: 32.9 g/dL (ref 30.0–36.0)
MCV: 93.8 fL (ref 80.0–100.0)
Monocytes Absolute: 0.5 10*3/uL (ref 0.1–1.0)
Monocytes Relative: 14 %
Neutro Abs: 1.8 10*3/uL (ref 1.7–7.7)
Neutrophils Relative %: 54 %
Platelets: 263 10*3/uL (ref 150–400)
RBC: 4.18 MIL/uL (ref 3.87–5.11)
RDW: 13.4 % (ref 11.5–15.5)
WBC: 3.4 10*3/uL — ABNORMAL LOW (ref 4.0–10.5)
nRBC: 0 % (ref 0.0–0.2)

## 2021-08-19 LAB — COMPREHENSIVE METABOLIC PANEL
ALT: 18 U/L (ref 0–44)
AST: 21 U/L (ref 15–41)
Albumin: 3.2 g/dL — ABNORMAL LOW (ref 3.5–5.0)
Alkaline Phosphatase: 47 U/L (ref 38–126)
Anion gap: 5 (ref 5–15)
BUN: 22 mg/dL (ref 8–23)
CO2: 29 mmol/L (ref 22–32)
Calcium: 9.6 mg/dL (ref 8.9–10.3)
Chloride: 106 mmol/L (ref 98–111)
Creatinine, Ser: 1.05 mg/dL — ABNORMAL HIGH (ref 0.44–1.00)
GFR, Estimated: 52 mL/min — ABNORMAL LOW (ref >60)
Glucose, Bld: 94 mg/dL (ref 70–99)
Potassium: 3.7 mmol/L (ref 3.5–5.1)
Sodium: 140 mmol/L (ref 135–145)
Total Bilirubin: 0.7 mg/dL (ref 0.3–1.2)
Total Protein: 5.9 g/dL — ABNORMAL LOW (ref 6.5–8.1)

## 2021-08-19 MED ORDER — PROCHLORPERAZINE 25 MG RE SUPP: 25.0000 mg | Freq: Two times a day (BID) | RECTAL | Status: DC | PRN

## 2021-08-19 MED ORDER — ALUM & MAG HYDROXIDE-SIMETH 200-200-20 MG/5ML PO SUSP: 15.0000 mL | ORAL | Status: DC | PRN

## 2021-08-19 MED ORDER — ACETAMINOPHEN 325 MG PO TABS
325.0000 mg | ORAL_TABLET | Freq: Four times a day (QID) | ORAL | Status: DC
  Administered 2021-08-19 – 2021-08-29 (×33): 325 mg via ORAL
  Filled 2021-08-19 (×36): qty 1

## 2021-08-19 MED ORDER — FLEET ENEMA 7-19 GM/118ML RE ENEM: 1.0000 | ENEMA | Freq: Every day | RECTAL | Status: DC | PRN

## 2021-08-19 MED ORDER — GUAIFENESIN 100 MG/5ML PO LIQD: 10.0000 mL | ORAL | Status: DC | PRN

## 2021-08-19 MED ORDER — PROCHLORPERAZINE EDISYLATE 10 MG/2ML IJ SOLN: 10.0000 mg | Freq: Two times a day (BID) | INTRAMUSCULAR | Status: DC | PRN

## 2021-08-19 MED ORDER — PROCHLORPERAZINE MALEATE 5 MG PO TABS: 5.0000 mg | ORAL_TABLET | Freq: Four times a day (QID) | ORAL | Status: DC | PRN

## 2021-08-19 NOTE — Plan of Care (Signed)
  Problem: Consults Goal: RH STROKE PATIENT EDUCATION Description: See Patient Education module for education specifics  Outcome: Progressing   Problem: RH BLADDER ELIMINATION Goal: RH STG MANAGE BLADDER WITH EQUIPMENT WITH ASSISTANCE Description: STG Manage Bladder With Equipment With max Assistance Outcome: Progressing   Problem: RH SKIN INTEGRITY Goal: RH STG MAINTAIN SKIN INTEGRITY WITH ASSISTANCE Description: STG Maintain Skin Integrity With min Assistance. Outcome: Progressing   Problem: RH SAFETY Goal: RH STG ADHERE TO SAFETY PRECAUTIONS W/ASSISTANCE/DEVICE Description: STG Adhere to Safety Precautions With Cues and Reminders. Outcome: Progressing   Problem: RH COGNITION-NURSING Goal: RH STG USES MEMORY AIDS/STRATEGIES W/ASSIST TO PROBLEM SOLVE Description: STG Uses Memory Aids/Strategies With min Assistance to Problem Solve. Outcome: Progressing Goal: RH STG ANTICIPATES NEEDS/CALLS FOR ASSIST W/ASSIST/CUES Description: STG Anticipates Needs/Calls for Assist With Cues and Reminders. Outcome: Progressing   Problem: RH KNOWLEDGE DEFICIT Goal: RH STG INCREASE KNOWLEDGE OF STROKE PROPHYLAXIS Description: Patient will demonstrate knowledge of medications used to prevent future strokes with educational materials and handouts provided by staff with min assist at discharge. Outcome: Progressing

## 2021-08-19 NOTE — Progress Notes (Signed)
Patient ID: Marie Mitchell, female   DOB: 03-30-1933, 88 y.o.   MRN: [id number]  Met with patient in room, introduced myself, and explained my role in her care. We discussed the medications she is currently taking and the [employer] contents. This RN told her the notebook is hers to take at discharge. This RN explained that team conference will be tomorrow, and how the conference works. Explained that social work will update the patient after conference. The patient verbalized understanding. Will continue to monitor patient.  Dorthula Nettles, RN3, BSN, CBIS, [place], [facility], Inpatient Rehabilitation Office [phone]

## 2021-08-19 NOTE — Plan of Care (Signed)
  Problem: RH Swallowing Goal: LTG Patient will consume least restrictive diet using compensatory strategies with assistance (SLP) Description: LTG:  Patient will consume least restrictive diet using compensatory strategies with assistance (SLP) Flowsheets (Taken 08/19/2021 1626) LTG: Pt Patient will consume least restrictive diet using compensatory strategies with assistance of (SLP): Supervision   Problem: RH Expression Communication Goal: LTG Patient will increase speech intelligibility (SLP) Description: LTG: Patient will increase speech intelligibility at word/phrase/conversation level with cues, % of the time (SLP) Flowsheets (Taken 08/19/2021 1626) LTG: Patient will increase speech intelligibility (SLP): Supervision Goal: LTG Patient will increase word finding of common (SLP) Description: LTG:  Patient will increase word finding of common objects/daily info/abstract thoughts with cues using compensatory strategies (SLP). Flowsheets (Taken 08/19/2021 1626) LTG: Patient will increase word finding of common (SLP): Supervision

## 2021-08-19 NOTE — Evaluation (Signed)
Speech Language Pathology Assessment and Plan  Patient Details  Name: Marie Mitchell MRN: [id number] Date of Birth: Dec 07, 1933  SLP Diagnosis: Speech and Language deficits;Dysphagia  Rehab Potential: Good ELOS: 10-14 days   Today's Date: 08/19/2021 SLP Individual Time: 1000-1040 SLP Individual Time Calculation (min): 40 min  Hospital Problem: Principal Problem:   Left basal ganglia embolic stroke [facility])  Past Medical History: History reviewed. No pertinent past medical history. Past Surgical History: History reviewed. No pertinent surgical history.  Assessment / Plan / Recommendation Clinical Impression HPI: Patient is an 88 yo R handed female with past medical history significant for osteoporosis, vitamin d deficiency, frequent falls, chronic Foley, and anxiety. Presented this admission s/p fall at home on 08/12/21, which happened after closing her blinds at home.  Patient is unsure if there was a LOC or head injury.  Initially she did not go to the ED.  However, in the afternoon on 08/13/21 she became increasingly confused with difficulty with seeing and writing. Upon going to the kitchen to prepare dinner her legs became weak and she fell, which resulted in LOC and head injury.  She then went to the ED with her daughter, at which time she had slurred speech and difficulty drinking water without chocking. MRI brain brain shows acute perforator infarct in the left basal ganglia, extending into overlying corona radiata, with slight edema without mass effect. CT head shows chronic atrophy and small vessel ischemic changes, with no acute intracranial abnormalities.    Pt presents with at least mild cognitive impairments characterized by decreased problem solving, mental manipulation, generative naming, and calculations. Memory, attention, and intellectual awareness appeared grossly intact based on brief informal assessment. Daughter present and endorsed mild cognitive deficits at baseline; daughter  feels patient is at cognitive baseline. SLP administered the [facility] Mental Status Examination (SLUMS) and patient scored 19/26 points with a score of 27 or above considered within the normal range. Score was modified to omit clock drawing due to dominant hand weakness impacting writing.   Pt/daughter endorse changes to expressive language characterized by decreased word finding on occasion. This was not evident at the conversation level per informal assessment, however pt only able to name 7 animals in one minute with generative naming portion on SLUMS. Pt/daughter endorse slight changes to motor speech, especially when tired/fatigued. Speech was perceived as mildly dysarthric and intelligibility was perceived as ~90% at the sentence and conversation level. Pt benefited from verbal repetition and additional processing time for comprehension, however this may attribute to hearing loss. Further assessment recommended.  Pt reports occasional coughing with liquid intake and with oral meds. Oral mechanism exam was unremarkable. Pt consumed single sips of thin liquid by cup with what appeared to be timely swallow initiation and without overt s/sx of aspiration. Swallow appears functional with liquids. Unable to assess solids due to time constraints. Will cont to follow for diet tolerance. Recommend continuation of regular diet with thin liquids. Pills whole in puree. Intermittent sup for feeding assist as needed.   Patient would benefit from skilled SLP intervention to maximize her speech/language and swallow functioning and overall functional independence prior to discharge. Do not plan to address cognition at this time with pt/daughter reporting currently at baseline.    Skilled Therapeutic Interventions          Pt participated in [place] Status Examination (SLUMS) as well as further non-standardized assessments of cognitive-linguistic, speech,language function, and BSE.  Please see above.  SLP Assessment  Patient will need skilled Speech Lanaguage Pathology Services during CIR admission    Recommendations  SLP Diet Recommendations: Age appropriate regular solids;Thin Liquid Administration via: Straw;Cup Medication Administration: Whole meds with puree Supervision: Intermittent supervision to cue for compensatory strategies Compensations: Small sips/bites;Slow rate;Minimize environmental distractions Postural Changes and/or Swallow Maneuvers: Seated upright 90 degrees Oral Care Recommendations: Oral care BID Patient destination: Assisted Living Follow up Recommendations: Other (comment) (TBD)  No equipment recommended by SLP   SLP Frequency 3 to 5 out of 7 days   SLP Duration  SLP Intensity  SLP Treatment/Interventions 10-14 days  Minumum of 1-2 x/day, 30 to 90 minutes  Speech/Language facilitation;Cueing hierarchy;Dysphagia/aspiration precaution training;Patient/family education;Therapeutic Activities    Pain    Prior Functioning Cognitive/Linguistic Baseline: Baseline deficits Baseline deficit details: Daughter reports memory deficits at baseline Type of Home: Assisted living  Lives With: Other (Comment) (ALF) Available Help at Discharge: Family;Available PRN/intermittently  SLP Evaluation Cognition Overall Cognitive Status: History of cognitive impairments - at baseline Arousal/Alertness: Awake/alert Orientation Level: Oriented to person;Oriented to place;Oriented to situation;Oriented to time Year: 2023 Month: May Day of Week: Correct Memory: Impaired Memory Impairment: Decreased recall of new information Awareness: Appears intact Problem Solving: Impaired Problem Solving Impairment: Verbal basic;Functional basic Safety/Judgment: Impaired  Comprehension Auditory Comprehension Overall Auditory Comprehension: Appears within functional limits for tasks assessed Interfering Components: Hearing;Processing  speed EffectiveTechniques: Extra processing time Expression Expression Primary Mode of Expression: Verbal Verbal Expression Overall Verbal Expression: Other (comment) (further assessment warranted; appeared WFL at basic level) Oral Motor Oral Motor/Sensory Function Overall Oral Motor/Sensory Function: Within functional limits Motor Speech Overall Motor Speech: Impaired Respiration: Within functional limits Phonation: Low vocal intensity Resonance: Within functional limits Articulation: Impaired Level of Impairment: Word Intelligibility: Intelligibility reduced Word: 75-100% accurate Phrase: 75-100% accurate Sentence: 75-100% accurate Conversation: 75-100% accurate Motor Planning: Witnin functional limits Interfering Components: Hearing loss  Care Tool Care Tool Cognition Ability to hear (with hearing aid or hearing appliances if normally used Ability to hear (with hearing aid or hearing appliances if normally used): 2. Moderate difficulty - speaker has to increase volume and speak distinctly   Expression of Ideas and Wants Expression of Ideas and Wants: 3. Some difficulty - exhibits some difficulty with expressing needs and ideas (e.g, some words or finishing thoughts) or speech is not clear   Understanding Verbal and Non-Verbal Content Understanding Verbal and Non-Verbal Content: 3. Usually understands - understands most conversations, but misses some part/intent of message. Requires cues at times to understand  Memory/Recall Ability Memory/Recall Ability : That he or she is in a hospital/hospital unit;Current season   Short Term Goals: Week 1: SLP Short Term Goal 1 (Week 1): Patient will consume current diet with minimal overt s/sx of aspiration with min A verbal cues for implementation of safe swallowing precautions/strategies SLP Short Term Goal 2 (Week 1): Patient will participate in further language assessment  Refer to Care Plan for Long Term Goals  Recommendations for  other services: None   Discharge Criteria: Patient will be discharged from SLP if patient refuses treatment 3 consecutive times without medical reason, if treatment goals not met, if there is a change in medical status, if patient makes no progress towards goals or if patient is discharged from hospital.  The above assessment, treatment plan, treatment alternatives and goals were discussed and mutually agreed upon: by patient  Patty Sermons 08/19/2021, 4:24 PM

## 2021-08-19 NOTE — Plan of Care (Signed)
Educated patient on S/S of a stroke.

## 2021-08-19 NOTE — Progress Notes (Signed)
Physical Therapy Session Note  Patient Details  Name: Marie Mitchell MRN: [id number] Date of Birth: 1933-06-02  Today's Date: 08/19/2021 PT Individual Time: 1320-1415 and 1047-11:58 PT Individual Time Calculation (min): 55 min and 71 min   Short Term Goals: Week 1:  PT Short Term Goal 1 (Week 1): Patient will perform transfers consistently with min A using LRAD PT Short Term Goal 2 (Week 1): Patient will ambulate >200 ft using LRAD with CGA consistently PT Short Term Goal 3 (Week 1): Patient will improve score on Berg Balance Scale by 7 points to meet MCID  Skilled Therapeutic Interventions/Progress Updates:    AM SESSION Pt initially oob in wc w/daughter at sdie.  Transported to gym.  Sit to stand in parallel bars w/cues for hand placment, post tendency.  Worked on midline standing via forward reaching activity w/and w/o UE support w/overall decrease in post lean following.  Sidestepping length of bars x 4 w/cues to focus on deliberate clearance of R foot w/stepping each direction to prevent dragging, cues for posture.  Standing w/bilat UE support worked on stepping R foot repeatedly onto 3in step w/focus on avoiding ER w/placement.  Progressed to alternating feet then alternating feet without looking at feet and focusing on placement.  Gait:  35ft, 45ft x 1 w/RW w/min assist, occasional cues for upright gaze, RLE clearance/cues to focus on "picking up foot" to increase step length/clearance.  Pt w/narrow base, downward gaze, overall flexed posture/stooped appearance, slow cadence, cues at times to navigate around obstacles to either side.  Pt left oob in wc w/alarm belt set and needs in reach   PM SESSION DENIES PAIN Pt initially oob in wc completing lunch Transported to rehab apartment for functional strengthening/balance. Sit to stand from wc w/multimodal cues to promote ant wt shift w/transition to RW. Gait 78ft x 2 to/from counter where pt worked on placing cups and plates  onto first shelf via overhead reach + pushing them back into counter to promote ant wt shift.  Performed approx 5 min standing activity w/cga.  Repeated all x 2.  At 3in stairs using bilat handrails worked on single step up/down repeated x 15 alternating lead foot for LE functional strengthening.  Gait:  57ft w/RW w/2x 95ft intervals of cued increased pace walking mixed w/65ft x 2 self selected speed, improved gait pattern w//increased pace but frequent cueing to maintain pace required.  Pt left oob in wc w/alarm belt set and needs in reach    Therapy Documentation Precautions:  Precautions Precautions: Fall Restrictions Weight Bearing Restrictions: No LUE Weight Bearing: Weight bearing as tolerated LLE Weight Bearing: Weight bearing as tolerated  Therapy/Group: Individual Therapy Rada Hay, PT   Shearon Balo 08/19/2021, 3:45 PM

## 2021-08-19 NOTE — Progress Notes (Signed)
PROGRESS NOTE   Subjective/Complaints: No new concerns this AM. Reports slept well.   ROS:  Pt denies fevers, chills, SOB, abd pain, CP, N/V/C/D, and vision changes   Objective:   No results found. Recent Labs    08/19/21 0604  WBC 3.4*  HGB 12.9  HCT 39.2  PLT 263   Recent Labs    08/19/21 0604  NA 140  K 3.7  CL 106  CO2 29  GLUCOSE 94  BUN 22  CREATININE 1.05*  CALCIUM 9.6    Intake/Output Summary (Last 24 hours) at 08/19/2021 1158 Last data filed at 08/19/2021 0925 Gross per 24 hour  Intake 480 ml  Output 700 ml  Net -220 ml         Physical Exam: Vital Signs Blood pressure (!) 142/91, pulse 61, temperature (!) 97.5 F (36.4 C), temperature source Oral, resp. rate 18, height 5\' 7"  (1.702 m), weight 59.3 kg, SpO2 91 %.   General: awake, alert, appropriate, sitting in bed HENT: conjugate gaze; oropharynx moist CV: regular rate; no JVD Pulmonary: CTA B/L, no increased effort GI: soft, NT, ND, (+)BS Psychiatric: appropriate Neurological: alert Genitourinary:    Comments: Has foley in place- medium amber urine in bag Musculoskeletal:     Cervical back: Neck supple. No tenderness.     Comments: RUE- deltoid, biceps, triceps 4+/5; otherwise 5-/5 LUE 5/5  RLE- HF 4+/5 and R PF 4+/5; otherwise 5-/5 LLE- 5-/5 throughout  Skin:    General: Skin is warm and dry.     Comments: L forearm IV- a little blood flash in IV- looks OK otherwise  Neurological:     Mental Status: She is alert.     Comments: Limited historian Intact to light touch in all 4 extremities and face Ox2  Assessment/Plan: 1. Functional deficits which require 3+ hours per day of interdisciplinary therapy in a comprehensive inpatient rehab setting. Physiatrist is providing close team supervision and 24 hour management of active medical problems listed below. Physiatrist and rehab team continue to assess barriers to  discharge/monitor patient progress toward functional and medical goals  Care Tool:  Bathing    Body parts bathed by patient: Front perineal area, Buttocks, Right upper leg, Left upper leg, Right lower leg, Left lower leg, Face   Body parts bathed by helper: Left arm, Right arm, Chest, Abdomen, Buttocks (Buttocks again for thoroughness)     Bathing assist Assist Level: Minimal Assistance - Patient > 75%     Upper Body Dressing/Undressing Upper body dressing   What is the patient wearing?: Pull over shirt, Bra    Upper body assist Assist Level: Minimal Assistance - Patient > 75%    Lower Body Dressing/Undressing Lower body dressing      What is the patient wearing?: Underwear/pull up (Depends)     Lower body assist Assist for lower body dressing: Total Assistance - Patient < 25%     Toileting Toileting Toileting Activity did not occur Landscape architect and hygiene only):  (for foley care)  Toileting assist Assist for toileting: Dependent - Patient 0%     Transfers Chair/bed transfer  Transfers assist     Chair/bed transfer assist level:  Minimal Assistance - Patient > 75%     Locomotion Ambulation   Ambulation assist      Assist level: Maximal Assistance - Patient 25 - 49% Assistive device: Hand held assist Max distance: 2 ft   Walk 10 feet activity   Assist  Walk 10 feet activity did not occur: Safety/medical concerns        Walk 50 feet activity   Assist Walk 50 feet with 2 turns activity did not occur: Safety/medical concerns         Walk 150 feet activity   Assist Walk 150 feet activity did not occur: Safety/medical concerns         Walk 10 feet on uneven surface  activity   Assist Walk 10 feet on uneven surfaces activity did not occur: Safety/medical concerns         Wheelchair     Assist Is the patient using a wheelchair?: Yes Type of Wheelchair: Manual    Wheelchair assist level: Total Assistance - Patient <  25% Max wheelchair distance: >150 ft    Wheelchair 50 feet with 2 turns activity    Assist        Assist Level: Total Assistance - Patient < 25%   Wheelchair 150 feet activity     Assist      Assist Level: Total Assistance - Patient < 25%   Blood pressure (!) 142/91, pulse 61, temperature (!) 97.5 F (36.4 C), temperature source Oral, resp. rate 18, height 5\' 7"  (1.702 m), weight 59.3 kg, SpO2 91 %.  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 with R hemiparesis ELOS 16-20 days min A Patient able to shower. Continue PT/OT/ST, OT working on grooming, dressing, bathing today 2. DVT Prophylaxis/Anticoagulation: Pharmaceutical: Lovenox             On ASA and Plavix- Plavix for 21 days 2. Pain Management: will schedule tylenol 325 mg q6 hours and con't prn as well- might need tramadol, however makes her constipated  5/28- will add tramadol per pt request prn- 50 mg q6 hours prn  5/29 pain overall under control, not using prn tramadol yet 3. Mood: Hx of anxiety > depression- will con't Prozac that she's been on for 20 years- might need to increase it and might need to see Neuropsychology.  4. Patient is not able to make decisions for herself due to stroke and per family, recent reduction in mentation.  5. HTN- patient's BP has been running higher- on Norvasc for BP meds- will monitor for trend -BP well controlled overall today, continue to monitor 6. HLD- will continue statin that was started per Neurology 7. L sacral ala fracture- is WBAT on B/L LE's, however will treat pain, since tx is conservative. Might require Tramadol.  8. Constipation- is chronic for pt- will schedule Miralax QOD for now; however if add tramadol, will make daily.   5/28- will change to daily.   5/29 BM yesterday, continue to follow 9. Chronic foley due to bladder spasms- just changed on day of admission- pt accidentally pulled  it out- will try to use leg bag as much as possible.  10.  Recent kyphoplasty 07/08/21- T8/T12/L1 for compression fractures.  11. Osteoporosis/Vit D Deficiency- con't Vit D and monitor for falls- since has hx of frequent falls.  12. Sundowning at home- will monitor for behavior- might need something for sundowning.  13. Insomnia  5/28- will add Trazodone 25 mg QHS for sleep- might need  more, but since 71, will start low.   5/29 Reports sleep improved, monitor 14.Mild Azotemia  -overall stable, advised to drink fluids        LOS: 2 days A FACE TO FACE EVALUATION WAS PERFORMED  Jennye Boroughs 08/19/2021, 11:58 AM

## 2021-08-19 NOTE — Discharge Instructions (Signed)
Inpatient Rehab Discharge Instructions  [facility] Discharge date and time:    Activities/Precautions/ Functional Status: Activity: no lifting, driving, or strenuous exercise for till cleared by MD Diet: low fat, low cholesterol diet Wound Care:    Functional status:  ___ No restrictions     ___ Walk up steps independently ___ 24/7 supervision/assistance   ___ Walk up steps with assistance ___ Intermittent supervision/assistance  ___ Bathe/dress independently ___ Walk with walker     ___ Bathe/dress with assistance ___ Walk Independently    ___ Shower independently ___ Walk with assistance    ___ Shower with assistance ___ No alcohol     ___ Return to work/school ________  Special Instructions:    My questions have been answered and I understand these instructions. I will adhere to these goals and the provided educational materials after my discharge from the hospital.  Patient/Caregiver Signature _______________________________ Date __________  Clinician Signature _______________________________________ Date __________  Please bring this form and your medication list with you to all your follow-up doctor's appointments.

## 2021-08-19 NOTE — Progress Notes (Signed)
Occupational Therapy Session Note  Patient Details  Name: Marie Mitchell MRN: [id number] Date of Birth: 1933-07-11  Today's Date: 08/19/2021 OT Individual Time: 0800-0900 OT Individual Time Calculation (min): 60 min    Short Term Goals: Week 1:  OT Short Term Goal 1 (Week 1): Pt will use AE as needed to thread BLEs into LB clothing with no more than supervision for sitting balance OT Short Term Goal 2 (Week 1): Pt will complete sit > stand in prep for ADL task with min A using LRAD OT Short Term Goal 3 (Week 1): Pt will use compensatory technqiue to donn bra with supervision and verbal cues as needed OT Short Term Goal 4 (Week 1): Pt will complete 1/3 steps during toileting with no more than mod A for standing balance  Skilled Therapeutic Interventions/Progress Updates:    S: Report no pain this morning. Thought she lost a hearing aid in the bed last night although both are in her ears.   O: ADL re-training: Grooming and oral care completed while seated at sink in w/c. Provided set-up with patient able to request supplies as needed.  UB dressing: Min A with assist with pull over shirt and bra. Pt reports that she received assistance with her bra prior. LB dressing: Total assist to thread catheter through pant legs and pull ups. Patient stood at sink while pulling up with both upper extremities. Verbal and physical cueing provided to correct standing posture again with patient demonstrating a heavy posterior lean; trunk flexed at hips. UB bathing: Pt washed underarms with OT assisting with rest at Min A.  LB bathing: Patient completed all LB bathing with OT providing set-up. Buttocks washed a second time for thoroughness while standing.  Functional transfers: Mod assist required during initial sit to stand using RW with VC for form and technique due to heavy posterior lean. Once standing, NDT technique used to facilitate upright standing posterior. Min assist with RW to complete transfer. VC  and physical assist to move RW PRN were provided for form, technique, and safety. Bed mobility: Supervision with HOB elevated.    A: Skilled OT session focused on ADL re-training. Patient declined needing to use the bathroom prior to sponge bathe. Pt with indwelling catheter. Slow to complete tasks and did require some occasional prompts to remain on task.     P: Continue with plan of care. Work on decreasing posterior lean and increasing functional performance during transfers.    Therapy Documentation Precautions:  Precautions Precautions: Fall Restrictions Weight Bearing Restrictions: No   Pain: Pain Assessment Pain Scale: 0-10 Pain Score: 0-No pain   Therapy/Group: Individual Therapy  Limmie Patricia, OTR/L,CBIS  Supplemental OT - [facility]  08/19/2021, 8:24 AM

## 2021-08-19 NOTE — Progress Notes (Signed)
Received report from [place] at [phone]

## 2021-08-20 DIAGNOSIS — I639 Cerebral infarction, unspecified: Secondary | ICD-10-CM | POA: Diagnosis not present

## 2021-08-20 MED ORDER — TRAZODONE HCL 50 MG PO TABS
50.0000 mg | ORAL_TABLET | Freq: Every day | ORAL | Status: DC
  Administered 2021-08-20: 50 mg via ORAL
  Filled 2021-08-20: qty 1

## 2021-08-20 NOTE — Progress Notes (Signed)
Occupational Therapy Session Note  Patient Details  Name: Marie Mitchell MRN: [id number] Date of Birth: 1933/05/12  Today's Date: 08/20/2021 OT Individual Time: 1130-1200 OT Individual Time Calculation (min): 30 min    Short Term Goals: Week 1:  OT Short Term Goal 1 (Week 1): Pt will use AE as needed to thread BLEs into LB clothing with no more than supervision for sitting balance OT Short Term Goal 2 (Week 1): Pt will complete sit > stand in prep for ADL task with min A using LRAD OT Short Term Goal 3 (Week 1): Pt will use compensatory technqiue to donn bra with supervision and verbal cues as needed OT Short Term Goal 4 (Week 1): Pt will complete 1/3 steps during toileting with no more than mod A for standing balance  Skilled Therapeutic Interventions/Progress Updates:    Pt resting in w/c upon arrival. Pt requested to return to bed, stating she had been in w/c since earlier in morning. Sit<>stand X 3 from w/c with min A and min verbal cues for sequencing. Stand pivot transfer to EOB with CGA/min A. Sidestepping at EOB with min A. Sit>supine with max A. Tot A to reposition in bed. Pt somewhat lethargic. Pt required min verbal cues for following commands. Pt remained in bed with all needs within reach. Bed alarm activated.   Therapy Documentation Precautions:  Precautions Precautions: Fall Restrictions Weight Bearing Restrictions: No LUE Weight Bearing: Weight bearing as tolerated LLE Weight Bearing: Weight bearing as tolerated   Pain:  Pt denies pain this morning   Therapy/Group: Individual Therapy  Rich Brave 08/20/2021, 12:03 PM

## 2021-08-20 NOTE — Care Management (Signed)
Inpatient Rehabilitation Center Individual Statement of Services  Patient Name:  Marie Mitchell  Date:  08/20/2021  Welcome to the Inpatient Rehabilitation Center.  Our goal is to provide you with an individualized program based on your diagnosis and situation, designed to meet your specific needs.  With this comprehensive rehabilitation program, you will be expected to participate in at least 3 hours of rehabilitation therapies Monday-Friday, with modified therapy programming on the weekends.  Your rehabilitation program will include the following services:  Physical Therapy (PT), Occupational Therapy (OT), Speech Therapy (ST), 24 hour per day rehabilitation nursing, Therapeutic Recreaction (TR), Psychology, Neuropsychology, Care Coordinator, Rehabilitation Medicine, Nutrition Services, Pharmacy Services, and Other  Weekly team conferences will be held on Tuesdays to discuss your progress.  Your Inpatient Rehabilitation Care Coordinator will talk with you frequently to get your input and to update you on team discussions.  Team conferences with you and your family in attendance may also be held.  Expected length of stay: 10-14 days    Overall anticipated outcome: Supervision  Depending on your progress and recovery, your program may change. Your Inpatient Rehabilitation Care Coordinator will coordinate services and will keep you informed of any changes. Your Inpatient Rehabilitation Care Coordinator's name and contact numbers are listed  below.  The following services may also be recommended but are not provided by the Inpatient Rehabilitation Center:  Driving Evaluations Home Health Rehabiltiation Services Outpatient Rehabilitation Services Vocational Rehabilitation   Arrangements will be made to provide these services after discharge if needed.  Arrangements include referral to agencies that provide these services.  Your insurance has been verified to be:  Medicare A/B  Your primary  doctor is:  PCP not in system  Pertinent information will be shared with your doctor and your insurance company.  Inpatient Rehabilitation Care Coordinator:  Susie Cassette [phone] or ([phone]  Information discussed with and copy given to patient by: Gretchen Short, 08/20/2021, 9:47 AM

## 2021-08-20 NOTE — Progress Notes (Signed)
Chronic foley catheter replaced w/ larger 14Fr w/ 15 cc of NS to fill the balloon to secure tubing from slipping out of the bladder. We will need to continue to assess for leakage of the catheter. Rn will share this w/ night shift Rn

## 2021-08-20 NOTE — Progress Notes (Signed)
Occupational Therapy Session Note  Patient Details  Name: Marie Mitchell MRN: [id number] Date of Birth: 1933-09-10  Today's Date: 08/20/2021 OT Individual Time: 1300-1330 OT Individual Time Calculation (min): 30 min  and Today's Date: 08/20/2021 OT Missed Time: 15 Minutes Missed Time Reason: Patient fatigue (lethargy)   Short Term Goals: Week 1:  OT Short Term Goal 1 (Week 1): Pt will use AE as needed to thread BLEs into LB clothing with no more than supervision for sitting balance OT Short Term Goal 2 (Week 1): Pt will complete sit > stand in prep for ADL task with min A using LRAD OT Short Term Goal 3 (Week 1): Pt will use compensatory technqiue to donn bra with supervision and verbal cues as needed OT Short Term Goal 4 (Week 1): Pt will complete 1/3 steps during toileting with no more than mod A for standing balance  Skilled Therapeutic Interventions/Progress Updates:    Pt resting in bed upon arrival. Bedside table positioned over pt with lunch tray. Pt reported that she was finished eating. Pt also reported that she had difficulty managing her meal. Wash cloths provided and pt cleaned her hands and face. Pt with diffculty keeping eyes open during session whenever she was not actively engaged. Pt closed her eyes x 2 when washing hands. Pt reported that she didn't sleep well the previous night. OT intervention with focus on RUE function and arousal. Pt remained in bed with all needs within reach. Bed alarm activated. Pt missed 15 mins skilled OT services 2/2 lethargy.   Therapy Documentation Precautions:  Precautions Precautions: Fall Restrictions Weight Bearing Restrictions: No LUE Weight Bearing: Weight bearing as tolerated LLE Weight Bearing: Weight bearing as tolerated General: General OT Amount of Missed Time: 15 Minutes Pain:  Pt denies pain this afternoon   Therapy/Group: Individual Therapy  Rich Brave 08/20/2021, 1:55 PM

## 2021-08-20 NOTE — Patient Care Conference (Signed)
Inpatient RehabilitationTeam Conference and Plan of Care Update Date: 08/20/2021   Time: 11:13 AM    Patient Name: Marie Mitchell      Medical Record Number: [id number]  Date of Birth: 11/02/1933 Sex: Female         Room/Bed: 4W18C/4W18C-01 Payor Info: Payor: MEDICARE / Plan: MEDICARE PART A AND B / Product Type: *No Product type* /    Admit Date/Time:  08/17/2021  3:03 PM  Primary Diagnosis:  Left basal ganglia embolic stroke [facility])  Hospital Problems: Principal Problem:   Left basal ganglia embolic stroke [facility])    Expected Discharge Date: Expected Discharge Date: 08/30/21  Team Members Present: Physician leading conference: Dr. Genice Rouge Social Worker Present: [name], LCSWA Nurse Present: Kennyth Arnold, RN PT Present: Midge Minium, PT OT Present: Ardis Rowan, Carlis Abbott, OT PPS Coordinator present : Fae Pippin, SLP     Current Status/Progress Goal Weekly Team Focus  Bowel/Bladder   Continent bowel LBM 5/28. Patient with chronic foley  remain continent  Assess q shift and PRN   Swallow/Nutrition/ Hydration   Reg diet/thin liquids - pt reporting occ coughing with thin liquids. Functional at eval. Will continue to follow  sup A - may be able to downgrade to mod I  tolerance of reg diet/thin liquids, pils whole in applesauce   ADL's   bathing-min A: LB dressing-max A; tranfsers-min A  supervision overall, LB dressing and bathing with min A; toileting-supervision  activity tolerance, BADL retraining, transfers, education   Mobility   Min assist amb 90 ft w/rw, sit/stand mod asist, supervision bed mobility  Mod I overall with functional mobility  Gait, functional mobility, strengthening, education, safety training, balance.   Communication   mild dysarthria; pt endorsed mild word finding deficits - further assessment is needed.  sup A  further language assessment. Speech intelligiblity strategies, word finding strategies   Safety/Cognition/ Behavioral  Observations  Daughter endorses mild cognitive impairment at baseline - feels pt is at her baseline. Therefore ST not addressing at this time  NA  NA   Pain   Denies pain. scheduled tylenol  <3 out of 10  assess pain q shift and PRN   Skin   general brusing to BUE  skin remain intact  assess skin q shift and PRN     Discharge Planning:  To be assessed. pt will return to [facility]. Pt dtr only able to provide intermittent Asst. SW will confirm no barriers to discharge.   Team Discussion: Scheduled Miralax daily. Under reports pain, nursing to encourage Tramadol. Very sleepy today, adjusted Trazodone dosage. Chronic foley in place, continent bowel. Discontinue IV. Came from Assisted Living, need to assess how much support she will have at discharge. May need SNF placement.   Patient on target to meet rehab goals: yes, supervision goals. Currently max assist for LBBD. Min assist transfers. Min assist 90 ft with RW, mod assist STS, supervision bed mobility. Regular diet, thin liquids. Mild dysarthria. Daughter reports cognition is baseline.  *See Care Plan and progress notes for long and short-term goals.   Revisions to Treatment Plan:  Adjusting medications.   Teaching Needs: Family education, medication/pain management, safety awareness, transfer/gait training, etc.   Current Barriers to Discharge: Decreased caregiver support, Home enviroment access/layout, and Lack of/limited family support  Possible Resolutions to Barriers: Family education Follow-up therapy Possible SNF placement     Medical Summary Current Status: Chronic foley- changedin ED; continent of bowel; denies pain, but is underreporter- has IV  Barriers to Discharge: Decreased family/caregiver support;Home enviroment access/layout;Medical stability;Weight bearing restrictions;Other (comments)  Barriers to Discharge Comments: was at assisted living- willing to do some hired, but not all day0 if cannot go to AL-  might need SNF first- is barrier Possible Resolutions to [employer]: pain from sacral fx is limiting- and underreporter- get IV out-regular diet- SLP- mild dysarthria- and word finding deficits- mild cognitive impairment- at baseline- SLP for swallow- barrier- Assisted living- d/c 6/9- to assisted living-   Continued Need for Acute Rehabilitation Level of Care: The patient requires daily medical management by a physician with specialized training in physical medicine and rehabilitation for the following reasons: Direction of a multidisciplinary physical rehabilitation program to maximize functional independence : Yes Medical management of patient stability for increased activity during participation in an intensive rehabilitation regime.: Yes Analysis of laboratory values and/or radiology reports with any subsequent need for medication adjustment and/or medical intervention. : Yes   I attest that I was present, lead the team conference, and concur with the assessment and plan of the team.   Tennis Must 08/20/2021, 4:13 PM

## 2021-08-20 NOTE — Progress Notes (Signed)
Patient ID: Marie Mitchell, female   DOB: 1933-11-23, 88 y.o.   MRN: [id number]  SW met with pt in room to provide updates from team conference, and d/c date 6/9.  [id number] spoke with pt dtr Lelon Frohlich to provide updates from team conference. Family has HCPOA/POA and she will bring in. Reports they would really like to avoid SNF due to previous bad experience and would like her to return to [facility] with hired help. States they had to move from their previous home which was equipped for their parents but since they were leasing, landlord decided to sale home. Unable to provide support needed as new home is not equipped and she is caring for 5 children. SW informed will provide updates from team conference next week.   Loralee Pacas, MSW, [place] Office: [phone] Cell: [phone] Fax: [phone]

## 2021-08-20 NOTE — IPOC Note (Addendum)
Overall Plan of Care [facility]) Patient Details Name: Marie Mitchell MRN: [id number] DOB: 1933/06/30  Admitting Diagnosis: Left basal ganglia embolic stroke [facility])  Hospital Problems: Principal Problem:   Left basal ganglia embolic stroke [facility])     Functional Problem List: Nursing Bladder, Endurance, Medication Management, Motor, Pain, Safety  PT Balance, Behavior, Edema, Endurance, Motor, Nutrition, Pain, Perception, Safety, Sensory, Skin Integrity  OT Balance, Cognition, Endurance, Motor, Pain, Safety, Sensory, Skin Integrity  SLP Cognition, Linguistic  TR         Basic ADL's: OT Grooming, Bathing, Dressing, Toileting     Advanced  ADL's: OT       Transfers: PT Bed Mobility, Bed to Chair, Car, Occupational psychologist, Research scientist (life sciences): PT Ambulation, Psychologist, prison and probation services     Additional Impairments: OT None  SLP Communication, Social Cognition expression Problem Solving  TR      Anticipated Outcomes Item Anticipated Outcome  Self Feeding Set up A  Swallowing  sup A   Basic self-care  Min A  Toileting  supervision   Bathroom Transfers supervision  Bowel/Bladder  min assist  Transfers  Supervision using LRAD  Locomotion  Supervision using LRAD  Communication  sup A  Cognition     Pain  n/a  Safety/Judgment  to remain fall free while in rehab   Therapy Plan: PT Intensity: Minimum of 1-2 x/day ,45 to 90 minutes PT Frequency: 5 out of 7 days PT Duration Estimated Length of Stay: 10-14 days OT Intensity: Minimum of 1-2 x/day, 45 to 90 minutes OT Frequency: 5 out of 7 days OT Duration/Estimated Length of Stay: 10-14 days SLP Intensity: Minumum of 1-2 x/day, 30 to 90 minutes SLP Frequency: 3 to 5 out of 7 days SLP Duration/Estimated Length of Stay: 10-14 days   Team Interventions: Nursing Interventions Patient/Family Education, Bladder Management, Disease Management/Prevention, Pain Management, Medication Management, Dysphagia/Aspiration  Precaution Training, Discharge Planning  PT interventions Ambulation/gait training, Cognitive remediation/compensation, Discharge planning, DME/adaptive equipment instruction, Functional mobility training, Pain management, Psychosocial support, Splinting/orthotics, Therapeutic Activities, UE/LE Strength taining/ROM, Visual/perceptual remediation/compensation, Wheelchair propulsion/positioning, UE/LE Coordination activities, Therapeutic Exercise, Stair training, Skin care/wound management, Patient/family education, Neuromuscular re-education, Functional electrical stimulation, Disease management/prevention, Firefighter, Warden/ranger  OT Interventions Warden/ranger, Discharge planning, Pain management, Self Care/advanced ADL retraining, Therapeutic Activities, UE/LE Coordination activities, Cognitive remediation/compensation, Functional mobility training, Patient/family education, Therapeutic Exercise, Community reintegration, Fish farm manager, Neuromuscular re-education, UE/LE Strength taining/ROM, Wheelchair propulsion/positioning  SLP Interventions Speech/Language facilitation, Financial trader, Dysphagia/aspiration precaution training, Patient/family education, Therapeutic Activities  TR Interventions    SW/CM Interventions Discharge Planning, Psychosocial Support, Patient/Family Education   Barriers to Discharge MD  Medical stability, Home enviroment access/loayout, Incontinence, Neurogenic bowel and bladder, Wound care, Weight, and Weight bearing restrictions  Nursing Neurogenic Bowel & Bladder, Lack of/limited family support, Weight bearing restrictions [facility]. 1 level, level entry. Family available to provide PRN/intermittent assist.  PT Decreased caregiver support, Lack of/limited family support, Behavior Returning to ALF with intermittent assist, may have some cognitive deficits impairing safety awareness and  awareness of deficits limited ability to left alone at d/c  OT Home environment access/layout, Incontinence, Lack of/limited family support    SLP  May have some cognitive deficits impairing safety awareness and awareness of deficits limited ability to left alone at d/c    SW       Team Discharge Planning: Destination: PT-Assisted Living ,OT- Assisted Living , SLP-Assisted Living Projected Follow-up: PT-Home health PT, OT-  Home  health OT, 24 hour supervision/assistance, SLP-Other (comment) (TBD) Projected Equipment Needs: PT-Rolling walker with 5" wheels, OT- To be determined, SLP- None Equipment Details: PT-pt uses rollator at baseline, may need RW for increased stability at d/c, OT-  Patient/family involved in discharge planning: PT- Patient,  OT-Patient, SLP-Patient, Family member/caregiver  MD ELOS: 10-14 days Medical Rehab Prognosis:  Good Assessment: The patient has been admitted for CIR therapies with the diagnosis of L Basal ganglia stroke. The team will be addressing functional mobility, strength, stamina, balance, safety, adaptive techniques and equipment, self-care, bowel and bladder mgt, patient and caregiver education, . Goals have been set at supervision to min A. Anticipated discharge destination is home.        See Team Conference Notes for weekly updates to the plan of care

## 2021-08-20 NOTE — Progress Notes (Signed)
During morning CHG and foley care, urine noted in patient's brief. Foley still in place with statlock intact. Cloudy yellow urine draining into bag. No evidence of kinks in tubing. 10cc balloon deflated and reinflated. Continue to monitor and will report off to oncoming shift.

## 2021-08-20 NOTE — Progress Notes (Signed)
Speech Language Pathology Daily Session Note  Patient Details  Name: Marie Mitchell MRN: [id number] Date of Birth: 05-31-1933  Today's Date: 08/20/2021 SLP Individual Time: 1345-1450 SLP Individual Time Calculation (min): 65 min  Short Term Goals: Week 1: SLP Short Term Goal 1 (Week 1): Patient will consume current diet with minimal overt s/sx of aspiration with min A verbal cues for implementation of safe swallowing precautions/strategies SLP Short Term Goal 2 (Week 1): Patient will participate in further language assessment SLP Short Term Goal 2 - Progress (Week 1): Met SLP Short Term Goal 3 (Week 1): Patient will complete convergent and divergent naming tasks with min A verbal cues to achieve 80% accuracy SLP Short Term Goal 4 (Week 1): Patient will implement speech intelligiblity strategies at the sentence level to achieve >80 intelligiblity with min A verbal cues  Skilled Therapeutic Interventions: Skilled ST treatment focused on communication goals. SLP facilitated session by administering the Western Aphasia Battery Bedside (WAB-B). Patient scored WFL in all subtests for both expressive/receptive components. Pt exhibited mild delay/hesitation with responding to convergent/divergent naming tasks however not significant. There was no word finding difficulty observed at the conversational level today. Results suggestive of possible anomia/mild higher level word finding deficits. STGs were updated accordingly. Pt exhibited low vocal intensity and increased rate of speech impacting speech intelligibility at the sentence level. SLP educated on speech strategies including increasing vocal loudness, slow rate, over-articulation. Pt implemented at the sentence level with mod A verbal cues.   SLP assessed pt's swallowing d/t report of occasional coughing with PO intake. Pt consumed regular textures with functional mastication, oral clearance, what appeared to be timely swallow initiation, and without  overt s/sx of aspiration. Pt followed regular texture snack with thin liquid cup sips without overt s/sx of aspiration. Pt appears appropriate to continue regular diet and thin liquids. Pills whole in puree d/t pt's report of improved pharyngeal clearance. Oropharyngeal swallow appears WFL based on today's assessment. Will continue to monitor diet tolerance prior to signing off on swallow goals. Patient was left in bed with alarm activated and immediate needs within reach at end of session. Continue per current plan of care.     Pain  None/denied  Therapy/Group: Individual Therapy  Patty Sermons 08/20/2021, 1:58 PM

## 2021-08-20 NOTE — Progress Notes (Signed)
PROGRESS NOTE   Subjective/Complaints:  Pt reports she's OK- woke briefly- but said didn't sleep well, so trying to catch up on sleep this AM.    ROS: Limited due to sedation/   Objective:   No results found. Recent Labs    08/19/21 0604  WBC 3.4*  HGB 12.9  HCT 39.2  PLT 263   Recent Labs    08/19/21 0604  NA 140  K 3.7  CL 106  CO2 29  GLUCOSE 94  BUN 22  CREATININE 1.05*  CALCIUM 9.6    Intake/Output Summary (Last 24 hours) at 08/20/2021 0907 Last data filed at 08/19/2021 1810 Gross per 24 hour  Intake 720 ml  Output 600 ml  Net 120 ml        Physical Exam: Vital Signs Blood pressure 139/72, pulse 63, temperature 97.7 F (36.5 C), temperature source Oral, resp. rate 18, height 5\' 7"  (1.702 m), weight 60.8 kg, SpO2 98 %.    General: sleepy; woke briefly; to speak with me;  NAD HENT: conjugate gaze; oropharynx moist CV: regular rate; no JVD Pulmonary: CTA B/L; no W/R/R- good air movement GI: soft, NT, ND, (+)BS Psychiatric: appropriate Neurological: sleepy- woke briefly and spoke with me Genitourinary:    Comments: Has foley in place- medium amber urine in bag Musculoskeletal:     Cervical back: Neck supple. No tenderness.     Comments: RUE- deltoid, biceps, triceps 4+/5; otherwise 5-/5 LUE 5/5  RLE- HF 4+/5 and R PF 4+/5; otherwise 5-/5 LLE- 5-/5 throughout  Skin:    General: Skin is warm and dry.     Comments: L forearm IV- a little blood flash in IV- looks OK otherwise  Neurological:     Mental Status: She is alert.     Comments: Limited historian Intact to light touch in all 4 extremities and face Ox2  Assessment/Plan: 1. Functional deficits which require 3+ hours per day of interdisciplinary therapy in a comprehensive inpatient rehab setting. Physiatrist is providing close team supervision and 24 hour management of active medical problems listed below. Physiatrist and rehab team  continue to assess barriers to discharge/monitor patient progress toward functional and medical goals  Care Tool:  Bathing    Body parts bathed by patient: Front perineal area, Buttocks, Right upper leg, Left upper leg, Right lower leg, Left lower leg, Face   Body parts bathed by helper: Left arm, Right arm, Chest, Abdomen, Buttocks (Buttocks again for thoroughness)     Bathing assist Assist Level: Minimal Assistance - Patient > 75%     Upper Body Dressing/Undressing Upper body dressing   What is the patient wearing?: Pull over shirt, Bra    Upper body assist Assist Level: Minimal Assistance - Patient > 75%    Lower Body Dressing/Undressing Lower body dressing      What is the patient wearing?: Underwear/pull up (Depends)     Lower body assist Assist for lower body dressing: Total Assistance - Patient < 25%     Toileting Toileting Toileting Activity did not occur and hygiene only):  (for foley care)  Toileting assist Assist for toileting: Dependent - Patient 0%  Transfers Chair/bed transfer  Transfers assist     Chair/bed transfer assist level: Minimal Assistance - Patient > 75%     Locomotion Ambulation   Ambulation assist      Assist level: Maximal Assistance - Patient 25 - 49% Assistive device: Hand held assist Max distance: 2 ft   Walk 10 feet activity   Assist  Walk 10 feet activity did not occur: Safety/medical concerns        Walk 50 feet activity   Assist Walk 50 feet with 2 turns activity did not occur: Safety/medical concerns         Walk 150 feet activity   Assist Walk 150 feet activity did not occur: Safety/medical concerns         Walk 10 feet on uneven surface  activity   Assist Walk 10 feet on uneven surfaces activity did not occur: Safety/medical concerns         Wheelchair     Assist Is the patient using a wheelchair?: Yes Type of Wheelchair: Manual    Wheelchair assist  level: Total Assistance - Patient < 25% Max wheelchair distance: >150 ft    Wheelchair 50 feet with 2 turns activity    Assist        Assist Level: Total Assistance - Patient < 25%   Wheelchair 150 feet activity     Assist      Assist Level: Total Assistance - Patient < 25%   Blood pressure 139/72, pulse 63, temperature 97.7 F (36.5 C), temperature source Oral, resp. rate 18, height 5\' 7"  (1.702 m), weight 60.8 kg, SpO2 98 %.  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 with R hemiparesis ELOS 16-20 days min A Patient able to shower. Team conference today to determine length of stay 5/30- Con't CIR- PT, OT and SLP 2. DVT Prophylaxis/Anticoagulation: Pharmaceutical: Lovenox             On ASA and Plavix- Plavix for 21 days 2. Pain Management: will schedule tylenol 325 mg q6 hours and con't prn as well- might need tramadol, however makes her constipated  5/28- will add tramadol per pt request prn- 50 mg q6 hours prn  5/29 pain overall under control, not using prn tramadol yet  5/30- pain adequate- con't scheduled tylenola nd prn tramadol- not using.  3. Mood: Hx of anxiety > depression- will con't Prozac that she's been on for 20 years- might need to increase it and might need to see Neuropsychology.  4. Patient is not able to make decisions for herself due to stroke and per family, recent reduction in mentation.  5. HTN- patient's BP has been running higher- on Norvasc for BP meds- will monitor for trend  5/30- BP controlled- cont' regimen 6. HLD- will continue statin that was started per Neurology 7. L sacral ala fracture- is WBAT on B/L LE's, however will treat pain, since tx is conservative. Might require Tramadol.  8. Constipation- is chronic for pt- will schedule Miralax QOD for now; however if add tramadol, will make daily.   5/28- will change to daily.   5/29 BM yesterday, continue to  follow 9. Chronic foley due to bladder spasms- just changed on day of admission- pt accidentally pulled it out- will try to use leg bag as much as possible.  10.  Recent kyphoplasty 07/08/21- T8/T12/L1 for compression fractures.  11. Osteoporosis/Vit D Deficiency- con't Vit D and monitor for falls- since has hx of frequent falls.  12. Sundowning at home- will monitor for behavior- might need something for sundowning.  13. Insomnia  5/28- will add Trazodone 25 mg QHS for sleep- might need more, but since 88, will start low.   5/29 Reports sleep improved, monitor  5/30- didn't sleep as well- will increase trazodone to 50 mg QHS 14.Mild Azotemia  -overall stable, advised to drink fluids    I spent a total of  36  minutes on total care today- >50% coordination of care- due to d/w nursing about pt's sedation and team conference to determine length of stay   LOS: 3 days A FACE TO FACE EVALUATION WAS PERFORMED  Marie Mitchell 08/20/2021, 9:07 AM

## 2021-08-20 NOTE — Progress Notes (Signed)
Inpatient Rehabilitation  Patient information reviewed and entered into eRehab system by [facility] M. Tenaya Hilyer, M.A., CCC/SLP, PPS Coordinator.  Information including medical coding, functional ability and quality indicators will be reviewed and updated through discharge.    

## 2021-08-20 NOTE — Progress Notes (Signed)
Occupational Therapy Session Note  Patient Details  Name: Marie Mitchell MRN: [id number] Date of Birth: 1934-02-08  Today's Date: 08/20/2021 OT Individual Time: 0802-0920 OT Individual Time Calculation (min): 78 min    Short Term Goals: Week 1:  OT Short Term Goal 1 (Week 1): Pt will use AE as needed to thread BLEs into LB clothing with no more than supervision for sitting balance OT Short Term Goal 2 (Week 1): Pt will complete sit > stand in prep for ADL task with min A using LRAD OT Short Term Goal 3 (Week 1): Pt will use compensatory technqiue to donn bra with supervision and verbal cues as needed OT Short Term Goal 4 (Week 1): Pt will complete 1/3 steps during toileting with no more than mod A for standing balance  Skilled Therapeutic Interventions/Progress Updates:    Patient sleeping soundly in bed - difficult to arouse.  Once awake, patient agreeable to get up out of bed.  Skilled OT intervention to address postural control in sitting and in standing, static to more dynamic balance in sitting and standing, functional use of RUE while completing familiar self care tasks.  Patient transferred to wheelchair, hearing aides placed in ears, and patient fed herself breakfast using LUE mostly on utensils - used RUE as assist.  Attempted to use RUE on occasion, but less coordinated, slower, and less sensory awareness.  Transitioned to sink to allow patient to wash and dress.  Patient indicated she had been bathed earlier by nursing staff.  Brief saturated with urine - patient with indwelling (long term) catheter.  Informed nursing of situation.   Patient with limited ability to manage long catheter tubing during dressing for LB - Brief and pants.  Patient with difficulty organizing shirt - slow to locate back of shirt, then in attempt to put shirt on , had difficulty locating and effectively placing arms in left and right sleeves.  Question mild apraxia.   Patient very pleasant throughout this  session, and identifying several of her challenges with self feeding and dressing.   Patient left up in wheelchair with safety belt in place and engaged.  Call bell and personal items within reach.    Therapy Documentation Precautions:  Precautions Precautions: Fall Restrictions Weight Bearing Restrictions: No LUE Weight Bearing: Weight bearing as tolerated LLE Weight Bearing: Weight bearing as tolerated   Pain: Reports no pain     Therapy/Group: Individual Therapy  Collier Salina 08/20/2021, 10:02 AM

## 2021-08-21 DIAGNOSIS — I639 Cerebral infarction, unspecified: Secondary | ICD-10-CM | POA: Diagnosis not present

## 2021-08-21 MED ORDER — SORBITOL 70 % SOLN
30.0000 mL | Freq: Once | Status: AC
Start: 2021-08-21 — End: 2021-08-21
  Administered 2021-08-21: 30 mL via ORAL
  Filled 2021-08-21: qty 30

## 2021-08-21 MED ORDER — SENNOSIDES-DOCUSATE SODIUM 8.6-50 MG PO TABS
1.0000 | ORAL_TABLET | Freq: Every day | ORAL | Status: DC
  Administered 2021-08-21 – 2021-08-25 (×5): 1 via ORAL
  Filled 2021-08-21 (×6): qty 1

## 2021-08-21 MED ORDER — TRAZODONE HCL 50 MG PO TABS
25.0000 mg | ORAL_TABLET | Freq: Every day | ORAL | Status: DC
  Administered 2021-08-21 – 2021-08-28 (×8): 25 mg via ORAL
  Filled 2021-08-21 (×8): qty 1

## 2021-08-21 NOTE — Progress Notes (Signed)
Speech Language Pathology Daily Session Note  Patient Details  Name: Marie Mitchell MRN: [id number] Date of Birth: 12/30/1933  Today's Date: 08/21/2021 SLP Individual Time: 1131-1203 SLP Individual Time Calculation (min): 32 min  Short Term Goals: Week 1: SLP Short Term Goal 1 (Week 1): Patient will consume current diet with minimal overt s/sx of aspiration with min A verbal cues for implementation of safe swallowing precautions/strategies SLP Short Term Goal 2 (Week 1): Patient will participate in further language assessment SLP Short Term Goal 2 - Progress (Week 1): Met SLP Short Term Goal 3 (Week 1): Patient will complete convergent and divergent naming tasks with min A verbal cues to achieve 80% accuracy SLP Short Term Goal 4 (Week 1): Patient will implement speech intelligiblity strategies at the sentence level to achieve >80 intelligiblity with min A verbal cues  Skilled Therapeutic Interventions: Skilled ST treatment focused on speech/language goals. Upon arrival pt stated she had been "struggling" with using her phone to call her daughter. Pt endorsed difficulty at baseline due to unfamiliar technology. SLP provided total fading to max A verbal/visual cues to locate contacts to execute phone call. Pt required min A throughout phone call due to accidentally pressing buttons and ending call, transitioning to different screens on phone, etc. SLP facilitated divergent naming task with mod A verbal (semantic) and visual (written) cues to generate up to 5 words in various categories. Pt felt slightly embarrassed and SLP provided reassurance and verbal encouragement, as well as education on word finding difficulty and language deficits post CVA. Patient was left in w/c with alarm activated and immediate needs within reach at end of session. Continue per current plan of care.      Pain  None/denied  Therapy/Group: Individual Therapy  Patty Sermons 08/21/2021, 12:57 PM

## 2021-08-21 NOTE — Progress Notes (Signed)
Occupational Therapy Session Note  Patient Details  Name: Marie Mitchell MRN: [id number] Date of Birth: [birth year]  Today's Date: 08/21/2021 OT Individual Time: [id number] Individual Time Calculation (min): 71 min    Short Term Goals: Week 1:  OT Short Term Goal 1 (Week 1): Pt will use AE as needed to thread BLEs into LB clothing with no more than supervision for sitting balance OT Short Term Goal 2 (Week 1): Pt will complete sit > stand in prep for ADL task with min A using LRAD OT Short Term Goal 3 (Week 1): Pt will use compensatory technqiue to donn bra with supervision and verbal cues as needed OT Short Term Goal 4 (Week 1): Pt will complete 1/3 steps during toileting with no more than mod A for standing balance  Skilled Therapeutic Interventions/Progress Updates:    Pt resting in bed upon arrival. Pt reports she didn't sleep well during the night but is agreeable to getting OOB for bathing/dressing at sink. Supine>sit EOB with supervision. Sit>stand and amb short distance with RW to sink-CGA. Pt with delayed initiation of tasks. Pt also noted with difficulty during [facility] tasks (hearing aids, opening containers). Pt completed bathing tasks with min A. Pt required assistance fastening bra. Pt required assistance pulling shirt over head and over trunk. Max A for LB dressing and threading foley catheter through pants. Pt states she typically uses a leg bag except at night. Standing balance at sink during ADLs with CGA/min A. Pt requires more then a reasonable amount of time to complete all tasks. Pt remained seated in w/c with all needs within reach. Belt alarm activated.   Therapy Documentation Precautions:  Precautions Precautions: Fall Restrictions Weight Bearing Restrictions: No LUE Weight Bearing: Weight bearing as tolerated LLE Weight Bearing: Weight bearing as tolerated   Pain:  Pt denies pain this morning  Therapy/Group: Individual Therapy  Leroy Libman 08/21/2021, 10:47 AM

## 2021-08-21 NOTE — Progress Notes (Signed)
Inpatient Rehabilitation Care Coordinator Assessment and Plan Patient Details  Name: Marie Mitchell MRN: [id number] Date of Birth: [birth year]  Today's Date: 08/21/2021  Hospital Problems: Principal Problem:   Left basal ganglia embolic stroke [facility])  Past Medical History: History reviewed. No pertinent past medical history. Past Surgical History: History reviewed. No pertinent surgical history. Social History:  reports that she has never smoked. She has never used smokeless tobacco. She reports current alcohol use. She reports that she does not use drugs.  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 [employer] - How often do you need to have someone help you when you read instructions, pamphlets, or other written material from your doctor or pharmacy?: Sometimes Writes: Yes Employment Status: Retired Marine scientist Issues: Denies Guardian/Conservator: N/A   Abuse/Neglect Abuse/Neglect Assessment Can Be Completed: Unable to assess, patient is non-responsive or altered mental status Physical Abuse: Denies Verbal Abuse: Denies Sexual Abuse: Denies Exploitation of patient/patient's resources: Denies Self-Neglect: Denies  Patient response to: Social Isolation - How often do you feel lonely or isolated from those around you?: Patient unable to respond  Emotional Status Pt's affect, behavior and adjustment status: Pt tired at  time of visit and dozing off and on during conversation Recent Psychosocial Issues: Denies Psychiatric History: hx of anxiety Substance Abuse History: Reports she is a social drinker.  Patient / Family Perceptions, Expectations & Goals Pt/Family understanding of illness & functional limitations: Pt family has a general understanding of pt care needs Premorbid pt/family roles/activities: Some assistance with IADLs Anticipated changes in roles/activities/participation: Assistance with ADLs/IADLs Pt/family expectations/goals: pt goal is to work on "walking and remembering things. "  Building surveyor: None Premorbid Home Care/DME Agencies: None Transportation available at discharge: TBD Is the patient able to respond to transportation needs?: Yes In the past 12 months, has lack of transportation kept you from medical appointments or from getting medications?: No In the past 12 months, has lack of transportation kept you from meetings, work, or from getting things needed for daily living?: No Resource referrals recommended: Neuropsychology  Discharge Planning Living Arrangements: Other (Comment) (ALF) Support Systems: Children, Home care staff Type of Residence: Assisted living Care Facility Name: Other (enter name of facility below) ([facility]) Care Facility Name: [facility] Insurance Resources: Electrical engineer Resources: Restaurant manager, fast food Screen Referred: No Living Expenses: Psychologist, sport and exercise Management: Family Does the patient have any problems obtaining your medications?: No Home Management: Pt receives assistance with housekeeping and meals due to beign in ALF Patient/Family Preliminary Plans: TBD Care Coordinator Barriers to Discharge: Decreased caregiver support Care Coordinator Anticipated Follow Up Needs: ALF/IL, HH/OP  Clinical Impression Pt is not a Cytogeneticist. HCPOA dtr Dewayne Hatch. No DME.   Feliberto Stockley A Allona Gondek 08/21/2021, 12:19 PM

## 2021-08-21 NOTE — Progress Notes (Signed)
Physical Therapy Session Note  Patient Details  Name: Marie Mitchell MRN: [id number] Date of Birth: 12/18/1933  Today's Date: 08/21/2021 PT Individual Time: 1017-5102 PT Individual Time Calculation (min): 60 min   Short Term Goals: Week 1:  PT Short Term Goal 1 (Week 1): Patient will perform transfers consistently with min A using LRAD PT Short Term Goal 2 (Week 1): Patient will ambulate >200 ft using LRAD with CGA consistently PT Short Term Goal 3 (Week 1): Patient will improve score on Berg Balance Scale by 7 points to meet MCID  Skilled Therapeutic Interventions/Progress Updates:    Pt received supine in bed with her daughter, Dewayne Hatch, present and pt agreeable to therapy session. Supine>sitting L EOB, HOB slightly elevated but not using bedrail, with supervision and increased time and effort. Throughout session pt noted to move very slowly requiring significantly increased time to complete mobility tasks. Sitting EOB donned socks set-up assist and increased time and attempts to successfully don R side - donned shoes max assist for time management. R stand pivot EOB>w/c using RW with heavy min assist for lifting to stand due to posterior lean followed by min assist for balance while turning with pt noted to have most difficulty stepping backwards with R LE towards seat.  Transported to/from gym in w/c for time management and energy conservation.  Sit>stands to RW progressed to lighter min assist during session with reinforced cuing to bring feet back under her BOS and bring trunk forward more when initiating the transfers.   Gait training 14ft using RW with light min assist for balance and AD management as pt consistently veering AD to L with inability to sustain correction despite attempts at repeated cuing- pt with excessive forward trunk flexion, which pt's daughter states was baseline - starts with step-to pattern having short R LE step length but progressed to reciprocal stepping although  continues to have poor R LE foot clearance during stance - pt with very slow gait speed.   Gait training 136ft with B HHA and heavy min assist for balance with pt pushing down heavily through HHA - pt with min improvement in upright posture and increase in gait speed with facilitation for forward movement - pt continues to have poor R LE advancement during swing, anticipate due to lack of sufficient sustained L weight shift onto L stance.  Pt noted to have impaired ability to step laterally and backwards when turning to sit back down in w/c at end of gait as well as consistent posterior LOB.   Dynamic gait training next to hallway rail for safety while performing lateral side stepping ~68ft in each direction with B HHA (pt relying on heavily) and heavy min assist for balance due to posterior LOB - significant difficulty stepping laterally due to poor ability to lift and step R LE in both directions (abduction and adduction) and again feel this is due to poor weight shifting onto stance limb. Pt required a significant amount of time to complete this task due to very slow movements and very small step lengths after requiring multiple attempts in order to actually being able to step.   Transitioned into // bars and performed forward/backwards stepping over small white stick with B UE support on // bars progressed to 1x with only R HHA and heavy min assist for balance - repeated cuing to maintain upright posture and facilitation/cuing for weight shifting onto stance limb.  Transported back to room and pt left seated in w/c with needs in reach, seat  belt alarm on, cell phone in hand, and meal tray set-up.  Pt would benefit from additional balance training focusing on correcting posterior LOB.    Therapy Documentation Precautions:  Precautions Precautions: Fall Restrictions Weight Bearing Restrictions: No LUE Weight Bearing: Weight bearing as tolerated LLE Weight Bearing: Weight bearing as  tolerated   Pain:  Denies pain during session.    Therapy/Group: Individual Therapy  Ginny Forth , PT, DPT, NCS, CSRS 08/21/2021, 3:12 PM

## 2021-08-21 NOTE — Progress Notes (Signed)
PROGRESS NOTE   Subjective/Complaints:  Pt reports didn't sleep last night- said doesn't do great with sleeping pills- slept great 2 nights ago, but not last night.  Foley was leaking at 79 french- was changed to 18 french- no leakage now.    ROS:  Pt denies SOB, abd pain, CP, N/V/C/D, and vision changes   Objective:   No results found. Recent Labs    08/19/21 0604  WBC 3.4*  HGB 12.9  HCT 39.2  PLT 263   Recent Labs    08/19/21 0604  NA 140  K 3.7  CL 106  CO2 29  GLUCOSE 94  BUN 22  CREATININE 1.05*  CALCIUM 9.6    Intake/Output Summary (Last 24 hours) at 08/21/2021 0800 Last data filed at 08/21/2021 0547 Gross per 24 hour  Intake 820 ml  Output 1300 ml  Net -480 ml        Physical Exam: Vital Signs Blood pressure 131/87, pulse 70, temperature 97.7 F (36.5 C), temperature source Oral, resp. rate 18, height 5\' 7"  (1.702 m), weight 60.8 kg, SpO2 98 %.   General: awake, alert, appropriate, laying supine in bed; NAD HENT: conjugate gaze; oropharynx dry CV: regular rate; no JVD Pulmonary: CTA B/L; no W/R/R- good air movement GI: soft, NT, ND, (+)BS Psychiatric: appropriate but sleepy Neurological: more alert  Genitourinary:    Comments: Has foley in place- medium amber urine in bag- no leakage Musculoskeletal:     Cervical back: Neck supple. No tenderness.     Comments: RUE- deltoid, biceps, triceps 4+/5; otherwise 5-/5 LUE 5/5  RLE- HF 4+/5 and R PF 4+/5; otherwise 5-/5 LLE- 5-/5 throughout  Skin:    General: Skin is warm and dry.     Comments: L forearm IV- a little blood flash in IV- looks OK otherwise  Neurological:     Mental Status: She is alert.     Comments: Limited historian Intact to light touch in all 4 extremities and face Ox2  Assessment/Plan: 1. Functional deficits which require 3+ hours per day of interdisciplinary therapy in a comprehensive inpatient rehab  setting. Physiatrist is providing close team supervision and 24 hour management of active medical problems listed below. Physiatrist and rehab team continue to assess barriers to discharge/monitor patient progress toward functional and medical goals  Care Tool:  Bathing    Body parts bathed by patient: Front perineal area, Face, Buttocks   Body parts bathed by helper: Left arm, Right arm, Chest, Abdomen, Buttocks (Buttocks again for thoroughness)     Bathing assist Assist Level: Minimal Assistance - Patient > 75%     Upper Body Dressing/Undressing Upper body dressing   What is the patient wearing?: Bra, Pull over shirt    Upper body assist Assist Level: Moderate Assistance - Patient 50 - 74% (perceptual challenges - front/back, L/R, unable to hook bra)    Lower Body Dressing/Undressing Lower body dressing      What is the patient wearing?: Underwear/pull up, Pants     Lower body assist Assist for lower body dressing: Maximal Assistance - Patient 25 - 49%     Toileting Toileting Toileting Activity did not occur Landscape architect  and hygiene only):  (for foley care)  Toileting assist Assist for toileting: Dependent - Patient 0%     Transfers Chair/bed transfer  Transfers assist     Chair/bed transfer assist level: Minimal Assistance - Patient > 75%     Locomotion Ambulation   Ambulation assist      Assist level: Maximal Assistance - Patient 25 - 49% Assistive device: Hand held assist Max distance: 2 ft   Walk 10 feet activity   Assist  Walk 10 feet activity did not occur: Safety/medical concerns        Walk 50 feet activity   Assist Walk 50 feet with 2 turns activity did not occur: Safety/medical concerns         Walk 150 feet activity   Assist Walk 150 feet activity did not occur: Safety/medical concerns         Walk 10 feet on uneven surface  activity   Assist Walk 10 feet on uneven surfaces activity did not occur:  Safety/medical concerns         Wheelchair     Assist Is the patient using a wheelchair?: Yes Type of Wheelchair: Manual    Wheelchair assist level: Total Assistance - Patient < 25% Max wheelchair distance: >150 ft    Wheelchair 50 feet with 2 turns activity    Assist        Assist Level: Total Assistance - Patient < 25%   Wheelchair 150 feet activity     Assist      Assist Level: Total Assistance - Patient < 25%   Blood pressure 131/87, pulse 70, temperature 97.7 F (36.5 C), temperature source Oral, resp. rate 18, height 5\' 7"  (1.702 m), weight 60.8 kg, SpO2 98 %.  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 with R hemiparesis ELOS 16-20 days min A Patient able to shower. D/c set for 6/9- hopefully to go directly back to Assisted living 5/31- Continue CIR- PT, OT and SLP  2. DVT Prophylaxis/Anticoagulation: Pharmaceutical: Lovenox             On ASA and Plavix- Plavix for 21 days 2. Pain Management: will schedule tylenol 325 mg q6 hours and con't prn as well- might need tramadol, however makes her constipated  5/28- will add tramadol per pt request prn- 50 mg q6 hours prn  5/29 pain overall under control, not using prn tramadol yet  5/30- pain adequate- con't scheduled tylenol and prn tramadol- not using.  3. Mood: Hx of anxiety > depression- will con't Prozac that she's been on for 20 years- might need to increase it and might need to see Neuropsychology.  4. Patient is not able to make decisions for herself due to stroke and per family, recent reduction in mentation.  5. HTN- patient's BP has been running higher- on Norvasc for BP meds- will monitor for trend  5/31- BP controlled- con't regimen 6. HLD- will continue statin that was started per Neurology 7. L sacral ala fracture- is WBAT on B/L LE's, however will treat pain, since tx is conservative. Might require Tramadol.  8.  Constipation- is chronic for pt- will schedule Miralax QOD for now; however if add tramadol, will make daily.   5/31- LBM 5/28- will give sorbitol after therapy since no BM 9. Chronic foley due to bladder spasms- just changed on day of admission- pt accidentally pulled it out- will try to use leg bag as much as possible.  10.  Recent  kyphoplasty 07/08/21- T8/T12/L1 for compression fractures.  11. Osteoporosis/Vit D Deficiency- con't Vit D and monitor for falls- since has hx of frequent falls.  12. Sundowning at home- will monitor for behavior- might need something for sundowning.  13. Insomnia  5/28- will add Trazodone 25 mg QHS for sleep- might need more, but since 88, will start low.   5/29 Reports sleep improved, monitor  5/30- didn't sleep as well- will increase trazodone to 50 mg QHS  5/31- per pt request, will change back to 25 mg QHS.  14.Mild Azotemia  -overall stable, advised to drink fluids  5/31- will recheck labs in AM     LOS: 4 days A FACE TO FACE EVALUATION WAS PERFORMED  Sylvana Bonk 08/21/2021, 8:00 AM

## 2021-08-21 NOTE — Progress Notes (Signed)
Physical Therapy Session Note  Patient Details  Name: Marie Mitchell MRN: [id number] Date of Birth: [birth year]  Today's Date: 08/21/2021 PT Individual Time: 1400-1430 PT Individual Time Calculation (min): 30 min   Short Term Goals: Week 1:  PT Short Term Goal 1 (Week 1): Patient will perform transfers consistently with min A using LRAD PT Short Term Goal 2 (Week 1): Patient will ambulate >200 ft using LRAD with CGA consistently PT Short Term Goal 3 (Week 1): Patient will improve score on Berg Balance Scale by 7 points to meet MCID  Skilled Therapeutic Interventions/Progress Updates:    Pt received seated in w/c in room, agreeable to PT session. No complaints of pain. Pt reports urge to toilet. Sit to stand with mod A to RW due to posterior bias. Ambulatory transfer into bathroom with RW and min A for balance, cues for safe RW management and to keep closer to her body. Pt requires some verbal and tactile cueing for motor planning and sequencing backing up to toilet. Pt requires some assist for clothing management. Pt able to continently void while seated on toilet, assist needed for thoroughness with pericare. Pt requests to return to bed at end of session due to fatigue. Ambulatory transfer back to bed with RW and min A. Sit to supine mod A needed for BLE management. Pt left seated in bed with needs in reach, bed alarm in place.  Therapy Documentation Precautions:  Precautions Precautions: Fall Restrictions Weight Bearing Restrictions: No LUE Weight Bearing: Weight bearing as tolerated LLE Weight Bearing: Weight bearing as tolerated       Therapy/Group: Individual Therapy   Peter Congo, PT, DPT, CSRS 08/21/2021, 3:05 PM

## 2021-08-21 NOTE — Progress Notes (Signed)
Patient ID: Marie Mitchell, female   DOB: January 03, 1934, 88 y.o.   MRN: [id number]  SW spoke with Hailey/Coordinator with [facility] ([id number]) to inform on pt d/c date, and will provide updates if any changes. Reports will need orders, new FL2, and clinical notes all signed by physician upon return.   Loralee Pacas, MSW, [place] Office: [phone] Cell: [phone] Fax: [phone]

## 2021-08-22 DIAGNOSIS — I639 Cerebral infarction, unspecified: Secondary | ICD-10-CM | POA: Diagnosis not present

## 2021-08-22 LAB — BASIC METABOLIC PANEL
Anion gap: 3 — ABNORMAL LOW (ref 5–15)
BUN: 26 mg/dL — ABNORMAL HIGH (ref 8–23)
CO2: 30 mmol/L (ref 22–32)
Calcium: 9.4 mg/dL (ref 8.9–10.3)
Chloride: 106 mmol/L (ref 98–111)
Creatinine, Ser: 1.02 mg/dL — ABNORMAL HIGH (ref 0.44–1.00)
GFR, Estimated: 54 mL/min — ABNORMAL LOW (ref >60)
Glucose, Bld: 87 mg/dL (ref 70–99)
Potassium: 4 mmol/L (ref 3.5–5.1)
Sodium: 139 mmol/L (ref 135–145)

## 2021-08-22 NOTE — Progress Notes (Addendum)
Patient ID: Marie Mitchell, female   DOB: 02-Nov-1933, 88 y.o.   MRN: [id number]  SW left message for pt dtr Dewayne Hatch to follow-up about scheduling family education to discuss best plan of care moving forward for pt mother. SW waiting on follow-up. *SW left message for pt dtr Dewayne Hatch reporting will find out her  mother's therapy schedule for tomorrow and let her know if there is a session around 2:30pm. SW explained scheduling process. SW requested if no therapy sessions that are able to accommodate, please schedule for Monday if possible. SW waiting on follow-up.   SW left second message informing on therapy schedule tomorrow and waiting to hear if able to make any of the times:  8-9 w/ Dr. Elvera Lennox [phone] OT 11-12 ST 1-2 PT   [name], MSW, [place] Office: [phone] Cell: [phone] Fax: [phone]

## 2021-08-22 NOTE — Plan of Care (Signed)
  Problem: Consults Goal: RH STROKE PATIENT EDUCATION Description: See Patient Education module for education specifics  Outcome: Progressing   Problem: RH BLADDER ELIMINATION Goal: RH STG MANAGE BLADDER WITH EQUIPMENT WITH ASSISTANCE Description: STG Manage Bladder With Equipment With max Assistance Outcome: Progressing   Problem: RH SKIN INTEGRITY Goal: RH STG MAINTAIN SKIN INTEGRITY WITH ASSISTANCE Description: STG Maintain Skin Integrity With min Assistance. Outcome: Progressing   Problem: RH SAFETY Goal: RH STG ADHERE TO SAFETY PRECAUTIONS W/ASSISTANCE/DEVICE Description: STG Adhere to Safety Precautions With Cues and Reminders. Outcome: Progressing   Problem: RH COGNITION-NURSING Goal: RH STG USES MEMORY AIDS/STRATEGIES W/ASSIST TO PROBLEM SOLVE Description: STG Uses Memory Aids/Strategies With min Assistance to Problem Solve. Outcome: Progressing Goal: RH STG ANTICIPATES NEEDS/CALLS FOR ASSIST W/ASSIST/CUES Description: STG Anticipates Needs/Calls for Assist With Cues and Reminders. Outcome: Progressing   Problem: RH KNOWLEDGE DEFICIT Goal: RH STG INCREASE KNOWLEDGE OF STROKE PROPHYLAXIS Description: Patient will demonstrate knowledge of medications used to prevent future strokes with educational materials and handouts provided by staff with min assist at discharge. Outcome: Progressing   

## 2021-08-22 NOTE — Progress Notes (Signed)
Speech Language Pathology Daily Session Note  Patient Details  Name: Marie Mitchell MRN: [id number] Date of Birth: 09/28/1933  Today's Date: 08/22/2021 SLP Individual Time: 7014-1030 SLP Individual Time Calculation (min): 62 min  Short Term Goals: Week 1: SLP Short Term Goal 1 (Week 1): Patient will consume current diet with minimal overt s/sx of aspiration with min A verbal cues for implementation of safe swallowing precautions/strategies SLP Short Term Goal 2 (Week 1): Patient will participate in further language assessment SLP Short Term Goal 2 - Progress (Week 1): Met SLP Short Term Goal 3 (Week 1): Patient will complete convergent and divergent naming tasks with min A verbal cues to achieve 80% accuracy SLP Short Term Goal 4 (Week 1): Patient will implement speech intelligiblity strategies at the sentence level to achieve >80 intelligiblity with min A verbal cues  Skilled Therapeutic Interventions:Skilled ST services focused on swallow and speech/language skills. SLP facilitated PO consumption of regular textures and thin liquids via straw on lunch tray. Pt demonstrated ability to self-fed with supervision A as well as for cues to wipe occasional residue on right side of lips. Pt demonstrated slow but appropriate swallow function with delayed cough only noted during PO consumption of mixed consistencies. Pt reported this does not often occur. Pt demonstrated 85% intelligibility at the sentence level in a quite room with min A verbal cues to increase vocal intensity and over articulate. Pt demonstrated basic convergent naming with 100% and mildly complex convergent naming with 100% accuracy given supervision A semantic cues. Pt demonstrated increased difficulty in divergent naming tasks (animals, vegetable and clothing) naming 6-8 within a minute with mod A semantic cues.  Pt was left in room with call bell within reach and bed alarm set. SLP recommends to continue skilled services.      Pain Pain Assessment Pain Score: 0-No pain  Therapy/Group: Individual Therapy  Dariane Natzke  [facility] 08/22/2021, 1:25 PM

## 2021-08-22 NOTE — Progress Notes (Signed)
Occupational Therapy Session Note  Patient Details  Name: Marie Mitchell MRN: [id number] Date of Birth: 1934/03/14  Today's Date: 08/22/2021 OT Individual Time: 2633-3545 OT Individual Time Calculation (min): 74 min    Short Term Goals: Week 1:  OT Short Term Goal 1 (Week 1): Pt will use AE as needed to thread BLEs into LB clothing with no more than supervision for sitting balance OT Short Term Goal 2 (Week 1): Pt will complete sit > stand in prep for ADL task with min A using LRAD OT Short Term Goal 3 (Week 1): Pt will use compensatory technqiue to donn bra with supervision and verbal cues as needed OT Short Term Goal 4 (Week 1): Pt will complete 1/3 steps during toileting with no more than mod A for standing balance  Skilled Therapeutic Interventions/Progress Updates:    Pt resting in w/c upon arrival. Pt already dressed this morning. Pt states she is trying to operate her phone to check for phone calls. Pt required max verbal/demonstration cues for phone operation. Pt able to view texts and make a phone to call to one of her daughters. Pt transitioned to day room and engaged in table task with colored peg patterns. Pt completed medium difficulty task with supervision and extra time to complete. Pt requested to use toilet. Stand pivot toilet tranfser with CGA and mod verbal cues for sequecning and safety. Toielting tasks with max A. Pt returned to room and requested to return to bed. Stand pivot transfer to EOB with min A. Sit>supine with min A. Pt remained in bed with all needs within reach and bed alarm activated.    Therapy Documentation Precautions:  Precautions Precautions: Fall Restrictions Weight Bearing Restrictions: No LUE Weight Bearing: Weight bearing as tolerated LLE Weight Bearing: Weight bearing as tolerated  Pain:  Pt denies pain this morning.   Therapy/Group: Individual Therapy  Rich Brave 08/22/2021, 10:47 AM

## 2021-08-22 NOTE — Progress Notes (Signed)
Physical Therapy Session Note  Patient Details  Name: Marie Mitchell MRN: [id number] Date of Birth: 05/26/1933  Today's Date: 08/22/2021 PT Individual Time: 0800-0900 PT Individual Time Calculation (min): 60 min   Short Term Goals: Week 1:  PT Short Term Goal 1 (Week 1): Patient will perform transfers consistently with min A using LRAD PT Short Term Goal 2 (Week 1): Patient will ambulate >200 ft using LRAD with CGA consistently PT Short Term Goal 3 (Week 1): Patient will improve score on Berg Balance Scale by 7 points to meet MCID  Skilled Therapeutic Interventions/Progress Updates:    Pt received supine in bed asleep, arousable and with increased time agreeable to PT session. No complaints of pain at rest, does have onset of sacral pain with mobility. Utilized repositioning and rest breaks as needed for pain management. Supine to sit with min A with significantly increased time needed, HOB slightly elevated. Pt requires cues for how to perform reciprocal scooting while seated EOB. Assisted pt with donning new brief, pants, socks, and shoes dependently while seated EOB. Pt is min A to doff her robe and nightgown while seated EOB, min A to don new undershirt and zip-up jacket with increased time and cues needed to complete. Sit to stand with min A to RW, dependent to pull up pants in standing due to posterior lean and bracing of LE against the bed in standing. Stand pivot transfer to w/c with RW and min A, cues for sequencing to safely line herself up with w/c prior to sitting. Pt is setup A to wash her face while seated in w/c at sink. Pt then requesting to eat breakfast. Pt is setup A for breakfast. Pt left seated in w/c in room with needs in reach, quick release belt and chair alarm in place, eating breakfast.  Therapy Documentation Precautions:  Precautions Precautions: Fall Restrictions Weight Bearing Restrictions: No LUE Weight Bearing: Weight bearing as tolerated LLE Weight Bearing:  Weight bearing as tolerated       Therapy/Group: Individual Therapy   Peter Congo, PT, DPT, CSRS 08/22/2021, 11:49 AM

## 2021-08-22 NOTE — Progress Notes (Signed)
PROGRESS NOTE   Subjective/Complaints:  Slept OK per pt- is sleepy.  Back pain is "ok".   ROS:  Pt denies SOB, abd pain, CP, N/V/C/D, and vision changes  Objective:   No results found. No results for input(s): WBC, HGB, HCT, PLT in the last 72 hours.  Recent Labs    08/22/21 0546  NA 139  K 4.0  CL 106  CO2 30  GLUCOSE 87  BUN 26*  CREATININE 1.02*  CALCIUM 9.4    Intake/Output Summary (Last 24 hours) at 08/22/2021 [id number] Last data filed at 08/22/2021 0458 Gross per 24 hour  Intake 420 ml  Output 750 ml  Net -330 ml        Physical Exam: Vital Signs Blood pressure 139/67, pulse (!) 55, temperature 97.9 F (36.6 C), temperature source Oral, resp. rate 19, height 5\' 7"  (1.702 m), weight 60.8 kg, SpO2 100 %.    General: awake, alert, just woke up- sleepy; supine in bed; NAD HENT: conjugate gaze; oropharynx moist CV: bradycardic rate; no JVD Pulmonary: CTA B/L; no W/R/R- good air movement GI: soft, NT, ND, (+)BS Psychiatric: appropriate Neurological: sleepy- alert Genitourinary:    Comments: Has foley in place- medium amber urine in bag- no leakage Musculoskeletal:     Cervical back: Neck supple. No tenderness.     Comments: RUE- deltoid, biceps, triceps 4+/5; otherwise 5-/5 LUE 5/5  RLE- HF 4+/5 and R PF 4+/5; otherwise 5-/5 LLE- 5-/5 throughout  Skin:    General: Skin is warm and dry.     Comments: L forearm IV- a little blood flash in IV- looks OK otherwise  Neurological:     Mental Status: She is alert.     Comments: Limited historian Intact to light touch in all 4 extremities and face Ox2  Assessment/Plan: 1. Functional deficits which require 3+ hours per day of interdisciplinary therapy in a comprehensive inpatient rehab setting. Physiatrist is providing close team supervision and 24 hour management of active medical problems listed below. Physiatrist and rehab team continue to assess  barriers to discharge/monitor patient progress toward functional and medical goals  Care Tool:  Bathing    Body parts bathed by patient: Right arm, Left arm, Chest, Abdomen, Front perineal area, Buttocks, Right upper leg, Left upper leg, Face   Body parts bathed by helper: Right lower leg, Left lower leg     Bathing assist Assist Level: Minimal Assistance - Patient > 75%     Upper Body Dressing/Undressing Upper body dressing   What is the patient wearing?: Bra, Pull over shirt    Upper body assist Assist Level: Moderate Assistance - Patient 50 - 74%    Lower Body Dressing/Undressing Lower body dressing      What is the patient wearing?: Underwear/pull up, Pants     Lower body assist Assist for lower body dressing: Maximal Assistance - Patient 25 - 49%     Toileting Toileting Toileting Activity did not occur (Clothing management and hygiene only):  (for foley care)  Toileting assist Assist for toileting: Dependent - Patient 0%     Transfers Chair/bed transfer  Transfers assist     Chair/bed transfer assist level: Minimal  Assistance - Patient > 75% Chair/bed transfer assistive device: Walker, Archivist   Ambulation assist      Assist level: Minimal Assistance - Patient > 75% Assistive device: Walker-rolling Max distance: 182ft   Walk 10 feet activity   Assist  Walk 10 feet activity did not occur: Safety/medical concerns        Walk 50 feet activity   Assist Walk 50 feet with 2 turns activity did not occur: Safety/medical concerns         Walk 150 feet activity   Assist Walk 150 feet activity did not occur: Safety/medical concerns         Walk 10 feet on uneven surface  activity   Assist Walk 10 feet on uneven surfaces activity did not occur: Safety/medical concerns         Wheelchair     Assist Is the patient using a wheelchair?: Yes Type of Wheelchair: Manual    Wheelchair assist level: Total  Assistance - Patient < 25% Max wheelchair distance: >150 ft    Wheelchair 50 feet with 2 turns activity    Assist        Assist Level: Total Assistance - Patient < 25%   Wheelchair 150 feet activity     Assist      Assist Level: Total Assistance - Patient < 25%   Blood pressure 139/67, pulse (!) 55, temperature 97.9 F (36.6 C), temperature source Oral, resp. rate 19, height 5\' 7"  (1.702 m), weight 60.8 kg, SpO2 100 %.  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 with R hemiparesis ELOS 16-20 days min A Patient able to shower. D/c set for 6/9- hopefully to go directly back to Assisted living Continue CIR- PT, OT and SLP  2. DVT Prophylaxis/Anticoagulation: Pharmaceutical: Lovenox             On ASA and Plavix- Plavix for 21 days 2. Pain Management: will schedule tylenol 325 mg q6 hours and con't prn as well- might need tramadol, however makes her constipated  5/28- will add tramadol per pt request prn- 50 mg q6 hours prn  6/1- still using just tylenol scheduled- will ask therapy if pain is affecting therapy.  3. Mood: Hx of anxiety > depression- will con't Prozac that she's been on for 20 years- might need to increase it and might need to see Neuropsychology.  4. Patient is not able to make decisions for herself due to stroke and per family, recent reduction in mentation.  5. HTN- patient's BP has been running higher- on Norvasc for BP meds- will monitor for trend  6/1- BP controlled- con't regimen 6. HLD- will continue statin that was started per Neurology 7. L sacral ala fracture- is WBAT on B/L LE's, however will treat pain, since tx is conservative. Might require Tramadol.  8. Constipation- is chronic for pt- will schedule Miralax QOD for now; however if add tramadol, will make daily.   6/1- LBM yesterday afternoon- not clear if took sorbitol or just went.  9. Chronic foley due to bladder  spasms- just changed on day of admission- pt accidentally pulled it out- will try to use leg bag as much as possible.  10.  Recent kyphoplasty 07/08/21- T8/T12/L1 for compression fractures.  11. Osteoporosis/Vit D Deficiency- con't Vit D and monitor for falls- since has hx of frequent falls.  12. Sundowning at home- will monitor for behavior- might need something for sundowning.  13. Insomnia  5/28- will add Trazodone 25 mg QHS for sleep- might need more, but since 88, will start low.   5/29 Reports sleep improved, monitor  5/30- didn't sleep as well- will increase trazodone to 50 mg QHS  5/31- per pt request, will change back to 25 mg QHS.  14.Mild Azotemia  -overall stable, advised to drink fluids  6/1- BUN 26 and Cr 1.02- will recheck again in Am and if getting worse, will give IVFs.      LOS: 5 days A FACE TO FACE EVALUATION WAS PERFORMED  Trajon Rosete 08/22/2021, 8:22 AM

## 2021-08-23 LAB — BASIC METABOLIC PANEL
Anion gap: 4 — ABNORMAL LOW (ref 5–15)
BUN: 26 mg/dL — ABNORMAL HIGH (ref 8–23)
CO2: 29 mmol/L (ref 22–32)
Calcium: 9.3 mg/dL (ref 8.9–10.3)
Chloride: 106 mmol/L (ref 98–111)
Creatinine, Ser: 0.98 mg/dL (ref 0.44–1.00)
GFR, Estimated: 56 mL/min — ABNORMAL LOW (ref >60)
Glucose, Bld: 95 mg/dL (ref 70–99)
Potassium: 3.9 mmol/L (ref 3.5–5.1)
Sodium: 139 mmol/L (ref 135–145)

## 2021-08-23 NOTE — Progress Notes (Signed)
Occupational Therapy Session Note  Patient Details  Name: Marie Mitchell MRN: [id number] Date of Birth: 01/19/1934  Today's Date: 08/23/2021 OT Individual Time: [id number] OT Individual Time Calculation (min): 71 min    Short Term Goals: Week 1:  OT Short Term Goal 1 (Week 1): Pt will use AE as needed to thread BLEs into LB clothing with no more than supervision for sitting balance OT Short Term Goal 2 (Week 1): Pt will complete sit > stand in prep for ADL task with min A using LRAD OT Short Term Goal 3 (Week 1): Pt will use compensatory technqiue to donn bra with supervision and verbal cues as needed OT Short Term Goal 4 (Week 1): Pt will complete 1/3 steps during toileting with no more than mod A for standing balance  Skilled Therapeutic Interventions/Progress Updates:    Pt resting in bed upon arrival and agreeable to getting OOB and washing up. Supine>sit EOB with supervision. Pt amb with RW to w/c at sink with CGA. Pt selected clothing from dressing. Bathing with sit<>stand at sink with min A. UB dressing with min A. Pt elected to not wear a bra today. LB dressing with mod A sit<>stand at sink. Pt requires more then a reasonable amount of time to complete tasks. Pt remained in w/c with all needs within reach and belt alarm activated.   Therapy Documentation Precautions:  Precautions Precautions: Fall Restrictions Weight Bearing Restrictions: No LUE Weight Bearing: Weight bearing as tolerated LLE Weight Bearing: Weight bearing as tolerated   Pain: Pt reports her "bottom hurts a little," activity and repositioning   Therapy/Group: Individual Therapy  Leroy Libman 08/23/2021, 10:48 AM

## 2021-08-23 NOTE — Consult Note (Signed)
Neuropsychological Consultation   Patient:   Marie Mitchell   DOB:   1933/12/15  MR Number:  [id number]  Location:  [facility] [address] CENTER A [address] [id number] [place] [address] Dept: [phone] Loc: [phone]           Date of Service:   08/23/2021  Start Time:   8 AM End Time:   9 AM  Provider/Observer:  Arley Phenix, Psy.D.       Clinical Neuropsychologist       Billing Code/Service: [phone]  Chief Complaint:    Marie Mitchell is an 88 year old female with PMH of osteoporosis, vitamin D deficiency, frequent falls, chronic Foley, and anxiety.   Patient with fall at home on 5/22 then in afternoon of 08/13/2021 with increasing confusion and difficulty with vision and writing.  New fall with LOC after striking head.  ED presentation with slurred speech and swallowing/chocking trouble.  MRI brain acute perforator infarct in L BG and extending into corona radiata.  CT showed chronic atrophy and SVD.  Reason for Service:  Patient was referred for neuropsychological consultation due to coping and anxiety S/P BG infarction.  Below is the HPI for the current admission.  Marie Mitchell is an 88 y.o. R handed female with past medical history significant for osteoporosis, vitamin d deficiency, frequent falls, chronic Foley, and anxiety. Presented this admission s/p fall at home on 08/12/21, which happened after closing her blinds at home.  Patient is unsure if there was a LOC or head injury.  Initially she did not go to the ED.  However, in the afternoon on 08/13/21 she became increasingly confused with difficulty with seeing and writing. Upon going to the kitchen to prepare dinner her legs became weak and she fell, which resulted in LOC and head injury.  She then went to the ED with her daughter, at which time she had slurred speech and difficulty drinking water without chocking.   MRI brain brain shows acute perforator  infarct in the left basal ganglia, extending into overlying corona radiata, with slight edema without mass effect. CT head shows chronic atrophy and small vessel ischemic changes, with no acute intracranial abnormalities.  CTA head negative for large vessel occlusion. 2D echo, normal EF 55-60%. CT left hip was negative for acute hip fracture. Linear lucency and cortical offset in the inferior aspect of the lateral aspect of the sacrum on the left, possible nondisplaced fracture. MR sacrum demonstrated acute nondisplaced fracture of the inferior aspect of the left sacral ala extending into the S3 vertebral body with associated marrow edema and adjacent soft tissue edema.   Patient's LDL 119; HbA1c 5.3; and EF 55-60%; also NIHSS 5     Her Foley was changed in the ED.  Patient needs to be admitted to CIR for acute decline in level of function: mobility, ADL's, and cognition.   According to her daughter, had Botox of bladder recently. Uses chronic foley for complex bladder issues- is a massive under reporter and will never say she's hurting- suggested either scheduling tylenol, or that doesn't work, putting her on Tramadol, although it makes her constipated.     Current Status:  Patient was awake and alert laying in bed with slight head elevated.  Patient HOH and efforts made to make sure patient understood what was being said.  Patient with minimal motor deficits noted but she remained in bed during visit.  Patient denied acute worsening of anxiety.  Behavioral Observation: Marie Mitchell  presents as a 88 y.o.-year-old Right handedCaucasian Female who appeared her stated age. her dress was Appropriate and she was Well Groomed and her manners were Appropriate to the situation.  her participation was indicative of Appropriate and Redirectable behaviors.  There were physical disabilities noted.  she displayed an appropriate level of cooperation and motivation.     Interactions:    Active  Appropriate  Attention:   within normal limits and attention span and concentration were age appropriate  Memory:   within normal limits; recent and remote memory intact  Visuo-spatial:  not examined  Speech (Volume):  low  Speech:   normal; normal  Thought Process:  Coherent and Relevant  Though Content:  WNL; not suicidal and not homicidal  Orientation:   person, place, time/date, and situation  Judgment:   Good  Planning:   Fair  Affect:    Anxious  Mood:    Anxious  Insight:   Good  Intelligence:   normal  Medical History:  History reviewed. No pertinent past medical history.       Patient Active Problem List   Diagnosis Date Noted   Left basal ganglia embolic stroke (HCC) 08/17/2021   Acute CVA (cerebrovascular accident) (HCC) 08/15/2021   Falls frequently 08/15/2021   Sacral fracture (HCC) 08/15/2021   HTN (hypertension) 08/15/2021   Anxiety 08/15/2021   Osteoporosis 08/15/2021   Psychiatric History:  Prior history of anxiety  Family Med/Psych History: History reviewed. No pertinent family history.  Impression/DX:  Marie Mitchell is an 88 year old female with PMH of osteoporosis, vitamin D deficiency, frequent falls, chronic Foley, and anxiety.   Patient with fall at home on 5/22 then in afternoon of 08/13/2021 with increasing confusion and difficulty with vision and writing.  New fall with LOC after striking head.  ED presentation with slurred speech and swallowing/chocking trouble.  MRI brain acute perforator infarct in L BG and extending into corona radiata.  CT showed chronic atrophy and SVD.  Disposition/Plan:  Worked on coping and adjustment but patient is doing well with good recovery progress.  Diagnosis:    Left basal ganglia embolic stroke [facility]) - Plan: Ambulatory referral to Physical Medicine Rehab, Ambulatory referral to Neurology         Electronically Signed   _______________________ Arley Phenix, Psy.D. Clinical  Neuropsychologist

## 2021-08-23 NOTE — Progress Notes (Signed)
Speech Language Pathology Daily Session Note  Patient Details  Name: Marie Mitchell MRN: [id number] Date of Birth: 08/02/1933  Today's Date: 08/23/2021 SLP Individual Time: 3235-5732 SLP Individual Time Calculation (min): 53 min  Short Term Goals: Week 1: SLP Short Term Goal 1 (Week 1): Patient will consume current diet with minimal overt s/sx of aspiration with min A verbal cues for implementation of safe swallowing precautions/strategies SLP Short Term Goal 2 (Week 1): Patient will participate in further language assessment SLP Short Term Goal 2 - Progress (Week 1): Met SLP Short Term Goal 3 (Week 1): Patient will complete convergent and divergent naming tasks with min A verbal cues to achieve 80% accuracy SLP Short Term Goal 4 (Week 1): Patient will implement speech intelligiblity strategies at the sentence level to achieve >80 intelligiblity with min A verbal cues  Skilled Therapeutic Interventions: Pt seen for skilled ST with focus on speech and language goals, pt upright in wheelchair and agreeable to therapeutic tasks. Staff reports some mildly increased confusion, pt observed to be having significant difficulty managing cell phone upon entry (assist provided in previous ST sessions) however patient denies assist with phone at this time. SLP facilitating divergent naming task by providing overall Mod A semantic cues, pt often stating "I can think of it but cannot say it". Pt able to produce target word given 3 "clues" on 80% of opportunities provided min A cues. At simple conversation level, pt intelligibility ~85% with occasional verbal cues to increase vocal intensity and over articulate multi-syllabic words (decreased intelligibility for word "mimosa"). Pt expressing frustration with word finding difficulties but encouraged with performance and progress. Pt left in wheelchair with all needs within reach, cont ST POC.  Pain Pain Assessment Pain Scale: 0-10 Pain Score: 0-No  pain  Therapy/Group: Individual Therapy  Dewaine Conger 08/23/2021, 11:55 AM

## 2021-08-23 NOTE — Progress Notes (Addendum)
Patient ID: Marie Mitchell, female   DOB: 1933/10/11, 88 y.o.   MRN: [id number]  SW received phone call from [facility] with [name] [phone]) reporting that pt family was visiting yesterday, and pt has bed offer if needed.   0936-SW left message for pt dtr reporting received message and discussed if family edu were an option today or Monday, and to inform on above about LTC bed if they were to choose. SW waiting on follow-up.   *SW spoke with pt dtr to discuss above. Family edu scheduled for Monday (6/5) 9am-12pm with her dtr. Considering transitioning to [name].  [name], MSW, LCSWA Office: [phone] Cell: [phone] Fax: [phone]

## 2021-08-23 NOTE — Progress Notes (Signed)
Physical Therapy Session Note  Patient Details  Name: Marie Mitchell MRN: [id number] Date of Birth: [birth year]  Today's Date: 08/23/2021 PT Individual Time: 1303-1400 PT Individual Time Calculation (min): 57 min   Short Term Goals: Week 1:  PT Short Term Goal 1 (Week 1): Patient will perform transfers consistently with min A using LRAD PT Short Term Goal 2 (Week 1): Patient will ambulate >200 ft using LRAD with CGA consistently PT Short Term Goal 3 (Week 1): Patient will improve score on Berg Balance Scale by 7 points to meet MCID  Skilled Therapeutic Interventions/Progress Updates: Pt presented in w/c completing lunch and agreeable to therapy. Pt denies pain at rest and through duration of session. Pt required help to fix L hearing aid as pt had in hand. Pt transported to day room and participated in ambulation with RW. Pt noted to require minA for initial stand and demonstrated moderate posterior lean with verbal cues. Pt then participated in ambulation within day room ~168f with RW and CGA, including x 4 turns. Pt did require increased time to perform turns to both L and R and min cues to stay within close proximity to RW. Pt also participated in obstacle course ~459fx 2 incorporating weaving through cones and stepping over shuffleboard stick. Pt ambulated at slower pace when attempting to weave through cones but was able to perform safety with cues only to maintain close proximity to RW. Pt did require minA for sequencing when attempting to step over threshold. Pt then transferred to high/low mat and performed block practice Sit to stand from lowered mat. Pt required manual facilitation to increase anterior lean to recruit quads and increase wt bearing through forefoot. After x6 repetitions pt was [facility]. Pt then ambulated ~5029fhen transported remaining distance to room. Pt left in w/c at end of session with belt alarm on, call bell within reach and needs  met.      Therapy Documentation Precautions:  Precautions Precautions: Fall Restrictions Weight Bearing Restrictions: No LUE Weight Bearing: Weight bearing as tolerated LLE Weight Bearing: Weight bearing as tolerated General:   Vital Signs: Therapy Vitals Temp: 97.8 F (36.6 C) Temp Source: Oral Pulse Rate: (!) 57 Resp: 18 BP: (!) 95/55 Patient Position (if appropriate): Sitting Oxygen Therapy SpO2: 98 % O2 Device: Room Air Pain:   Mobility:   Locomotion :    Trunk/Postural Assessment :    Balance:   Exercises:   Other Treatments:      Therapy/Group: Individual Therapy  Weylyn Ricciuti 08/23/2021, 4:11 PM

## 2021-08-23 NOTE — Progress Notes (Signed)
PROGRESS NOTE   Subjective/Complaints:  Pt reports slept well Says had Bottom pain last night that was "mild" however is gone this AM- didn't ask for any prn pain meds.  Encouraged her to do so.   Also, pt didn't recognize me this AM. Thought I was a therapist, even though seen daily since admission.   ROS:  Pt denies SOB, abd pain, CP, N/V/C/D, and vision changes  Objective:   No results found. No results for input(s): WBC, HGB, HCT, PLT in the last 72 hours.  Recent Labs    08/22/21 0546 08/23/21 0541  NA 139 139  K 4.0 3.9  CL 106 106  CO2 30 29  GLUCOSE 87 95  BUN 26* 26*  CREATININE 1.02* 0.98  CALCIUM 9.4 9.3    Intake/Output Summary (Last 24 hours) at 08/23/2021 0817 Last data filed at 08/23/2021 0700 Gross per 24 hour  Intake 1080 ml  Output 1500 ml  Net -420 ml        Physical Exam: Vital Signs Blood pressure 129/74, pulse 64, temperature 98.3 F (36.8 C), resp. rate 14, height 5\' 7"  (1.702 m), weight 60.8 kg, SpO2 98 %.     General: awake, alert, appropriate, sitting up in bed; NAD HENT: conjugate gaze; oropharynx moist CV: regular rate; no JVD Pulmonary: CTA B/L; no W/R/R- good air movement GI: soft, NT, ND, (+)BS Psychiatric: appropriate- flat but calm Neurological: alert, however poor memory more evident this AM- didn't recognize interviewer/doctor Genitourinary:    Comments: Has foley in place- medium amber urine in bag- no leakage Musculoskeletal:     Cervical back: Neck supple. No tenderness.     Comments: RUE- deltoid, biceps, triceps 4+/5; otherwise 5-/5 LUE 5/5  RLE- HF 4+/5 and R PF 4+/5; otherwise 5-/5 LLE- 5-/5 throughout  Skin:    General: Skin is warm and dry.     Comments: L forearm IV- a little blood flash in IV- looks OK otherwise  Neurological:     Mental Status: She is alert.     Comments: Limited historian Intact to light touch in all 4 extremities and  face Ox2  Assessment/Plan: 1. Functional deficits which require 3+ hours per day of interdisciplinary therapy in a comprehensive inpatient rehab setting. Physiatrist is providing close team supervision and 24 hour management of active medical problems listed below. Physiatrist and rehab team continue to assess barriers to discharge/monitor patient progress toward functional and medical goals  Care Tool:  Bathing    Body parts bathed by patient: Right arm, Left arm, Chest, Abdomen, Front perineal area, Buttocks, Right upper leg, Left upper leg, Face   Body parts bathed by helper: Right lower leg, Left lower leg     Bathing assist Assist Level: Minimal Assistance - Patient > 75%     Upper Body Dressing/Undressing Upper body dressing   What is the patient wearing?: Bra, Pull over shirt    Upper body assist Assist Level: Moderate Assistance - Patient 50 - 74%    Lower Body Dressing/Undressing Lower body dressing      What is the patient wearing?: Underwear/pull up, Pants     Lower body assist Assist for lower body  dressing: Maximal Assistance - Patient 25 - 49%     Toileting Toileting Toileting Activity did not occur Press photographer(Clothing management and hygiene only):  (for foley care)  Toileting assist Assist for toileting: Dependent - Patient 0%     Transfers Chair/bed transfer  Transfers assist     Chair/bed transfer assist level: Minimal Assistance - Patient > 75% Chair/bed transfer assistive device: Geologist, engineeringWalker   Locomotion Ambulation   Ambulation assist      Assist level: Minimal Assistance - Patient > 75% Assistive device: Walker-rolling Max distance: 15825ft   Walk 10 feet activity   Assist  Walk 10 feet activity did not occur: Safety/medical concerns        Walk 50 feet activity   Assist Walk 50 feet with 2 turns activity did not occur: Safety/medical concerns         Walk 150 feet activity   Assist Walk 150 feet activity did not occur:  Safety/medical concerns         Walk 10 feet on uneven surface  activity   Assist Walk 10 feet on uneven surfaces activity did not occur: Safety/medical concerns         Wheelchair     Assist Is the patient using a wheelchair?: Yes Type of Wheelchair: Manual    Wheelchair assist level: Total Assistance - Patient < 25% Max wheelchair distance: >150 ft    Wheelchair 50 feet with 2 turns activity    Assist        Assist Level: Total Assistance - Patient < 25%   Wheelchair 150 feet activity     Assist      Assist Level: Total Assistance - Patient < 25%   Blood pressure 129/74, pulse 64, temperature 98.3 F (36.8 C), resp. rate 14, height 5\' 7"  (1.702 m), weight 60.8 kg, SpO2 98 %.  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 with R hemiparesis ELOS 16-20 days min A Patient able to shower. D/c set for 6/9- hopefully to go directly back to Assisted living Con't CIR- PT , OT and SLP 2. DVT Prophylaxis/Anticoagulation: Pharmaceutical: Lovenox             On ASA and Plavix- Plavix for 21 days 2. Pain Management: will schedule tylenol 325 mg q6 hours and con't prn as well- might need tramadol, however makes her constipated  5/28- will add tramadol per pt request prn- 50 mg q6 hours prn  6/1- still using just tylenol scheduled- will ask therapy if pain is affecting therapy.   6/2- had pain last night, but didn't ask for anything- con't regimen 3. Mood: Hx of anxiety > depression- will con't Prozac that she's been on for 20 years- might need to increase it and might need to see Neuropsychology.  4. Patient is not able to make decisions for herself due to stroke and per family, recent reduction in mentation.  5. HTN- patient's BP has been running higher- on Norvasc for BP meds- will monitor for trend  6/2- BP controlled- con't regimen 6. HLD- will continue statin that was started per  Neurology 7. L sacral ala fracture- is WBAT on B/L LE's, however will treat pain, since tx is conservative. Might require Tramadol.  8. Constipation- is chronic for pt- will schedule Miralax QOD for now; however if add tramadol, will make daily.   6/1- LBM yesterday afternoon- not clear if took sorbitol or just went.   6/2- LBM 2 days ago- if no BM  by tomorrow, will need intervention 9. Chronic foley due to bladder spasms- just changed on day of admission- pt accidentally pulled it out- will try to use leg bag as much as possible.  10.  Recent kyphoplasty 07/08/21- T8/T12/L1 for compression fractures.  11. Osteoporosis/Vit D Deficiency- con't Vit D and monitor for falls- since has hx of frequent falls.  12. Sundowning at home- will monitor for behavior- might need something for sundowning.  13. Insomnia  5/28- will add Trazodone 25 mg QHS for sleep- might need more, but since 88, will start low.   5/29 Reports sleep improved, monitor  5/30- didn't sleep as well- will increase trazodone to 50 mg QHS  5/31- per pt request, will change back to 25 mg QHS.   6/2- sleeping much better per pt- con't regimen 14.Mild Azotemia  -overall stable, advised to drink fluids  6/1- BUN 26 and Cr 1.02- will recheck again in Am and if getting worse, will give IVFs.   6/2- BUN still 26- will recheck Monday and give IVFs if needed.      LOS: 6 days A FACE TO FACE EVALUATION WAS PERFORMED  Tovia Kisner 08/23/2021, 8:17 AM

## 2021-08-24 DIAGNOSIS — I959 Hypotension, unspecified: Secondary | ICD-10-CM

## 2021-08-24 DIAGNOSIS — R001 Bradycardia, unspecified: Secondary | ICD-10-CM

## 2021-08-24 DIAGNOSIS — R52 Pain, unspecified: Secondary | ICD-10-CM

## 2021-08-24 MED ORDER — SALINE SPRAY 0.65 % NA SOLN
1.0000 | NASAL | Status: DC | PRN
  Administered 2021-08-24: 1 via NASAL
  Filled 2021-08-24: qty 44

## 2021-08-24 MED ORDER — CHLORHEXIDINE GLUCONATE CLOTH 2 % EX PADS
6.0000 | MEDICATED_PAD | Freq: Two times a day (BID) | CUTANEOUS | Status: DC
  Administered 2021-08-24 – 2021-08-29 (×10): 6 via TOPICAL

## 2021-08-24 MED ORDER — AMLODIPINE BESYLATE 2.5 MG PO TABS
2.5000 mg | ORAL_TABLET | Freq: Every day | ORAL | Status: DC
  Filled 2021-08-24 (×3): qty 1

## 2021-08-24 NOTE — Progress Notes (Signed)
PROGRESS NOTE   Subjective/Complaints: Sleepy this morning No new complaints Denies pain Patient's chart reviewed- No issues reported overnight  ROS:  Pt denies SOB, abd pain, CP, N/V/C/D, and vision changes  Objective:   No results found. No results for input(s): WBC, HGB, HCT, PLT in the last 72 hours.  Recent Labs    08/22/21 0546 08/23/21 0541  NA 139 139  K 4.0 3.9  CL 106 106  CO2 30 29  GLUCOSE 87 95  BUN 26* 26*  CREATININE 1.02* 0.98  CALCIUM 9.4 9.3    Intake/Output Summary (Last 24 hours) at 08/24/2021 1435 Last data filed at 08/24/2021 1314 Gross per 24 hour  Intake 415 ml  Output 2100 ml  Net -1685 ml        Physical Exam: Vital Signs Blood pressure 99/60, pulse (!) 59, temperature 98.2 F (36.8 C), temperature source Oral, resp. rate 16, height 5\' 7"  (1.702 m), weight 60.8 kg, SpO2 98 %.     General: awake, alert, appropriate, sitting up in bed; NAD HENT: conjugate gaze; oropharynx moist CV: Bradycardic Pulmonary: CTA B/L; no W/R/R- good air movement GI: soft, NT, ND, (+)BS Psychiatric: appropriate- flat but calm Neurological: alert, however poor memory more evident this AM- didn't recognize interviewer/doctor Genitourinary:    Comments: Has foley in place- medium amber urine in bag- no leakage Musculoskeletal:     Cervical back: Neck supple. No tenderness.     Comments: RUE- deltoid, biceps, triceps 4+/5; otherwise 5-/5 LUE 5/5  RLE- HF 4+/5 and R PF 4+/5; otherwise 5-/5 LLE- 5-/5 throughout  Skin:    General: Skin is warm and dry.     Comments: L forearm IV- a little blood flash in IV- looks OK otherwise  Neurological:     Mental Status: She is alert.     Comments: Limited historian Intact to light touch in all 4 extremities and face Ox2  Assessment/Plan: 1. Functional deficits which require 3+ hours per day of interdisciplinary therapy in a comprehensive inpatient rehab  setting. Physiatrist is providing close team supervision and 24 hour management of active medical problems listed below. Physiatrist and rehab team continue to assess barriers to discharge/monitor patient progress toward functional and medical goals  Care Tool:  Bathing    Body parts bathed by patient: Right arm, Left arm, Chest, Abdomen, Front perineal area, Right upper leg, Left upper leg, Buttocks, Face   Body parts bathed by helper: Right lower leg, Left lower leg     Bathing assist Assist Level: Minimal Assistance - Patient > 75%     Upper Body Dressing/Undressing Upper body dressing   What is the patient wearing?: Pull over shirt    Upper body assist Assist Level: Moderate Assistance - Patient 50 - 74%    Lower Body Dressing/Undressing Lower body dressing      What is the patient wearing?: Pants     Lower body assist Assist for lower body dressing: Maximal Assistance - Patient 25 - 49%     Toileting Toileting Toileting Activity did not occur (Clothing management and hygiene only):  (for foley care)  Toileting assist Assist for toileting: Dependent - Patient 0%  Transfers Chair/bed transfer  Transfers assist     Chair/bed transfer assist level: Minimal Assistance - Patient > 75% Chair/bed transfer assistive device: ArboriculturistWalker   Locomotion Ambulation   Ambulation assist      Assist level: Minimal Assistance - Patient > 75% Assistive device: Walker-rolling Max distance: 14825ft   Walk 10 feet activity   Assist  Walk 10 feet activity did not occur: Safety/medical concerns        Walk 50 feet activity   Assist Walk 50 feet with 2 turns activity did not occur: Safety/medical concerns         Walk 150 feet activity   Assist Walk 150 feet activity did not occur: Safety/medical concerns         Walk 10 feet on uneven surface  activity   Assist Walk 10 feet on uneven surfaces activity did not occur: Safety/medical concerns          Wheelchair     Assist Is the patient using a wheelchair?: Yes Type of Wheelchair: Manual    Wheelchair assist level: Total Assistance - Patient < 25% Max wheelchair distance: >150 ft    Wheelchair 50 feet with 2 turns activity    Assist        Assist Level: Total Assistance - Patient < 25%   Wheelchair 150 feet activity     Assist      Assist Level: Total Assistance - Patient < 25%   Blood pressure 99/60, pulse (!) 59, temperature 98.2 F (36.8 C), temperature source Oral, resp. rate 16, height 5\' 7"  (1.702 m), weight 60.8 kg, SpO2 98 %.  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 with R hemiparesis ELOS 16-20 days min A Patient able to shower. D/c set for 6/9- hopefully to go directly back to Assisted living Continue CIR- PT , OT and SLP 2. DVT Prophylaxis/Anticoagulation: Pharmaceutical: Lovenox             On ASA and Plavix- Plavix for 21 days 2. Pain Management: Continue scheduled tylenol 325 mg q6 hours and con't prn as well- might need tramadol, however makes her constipated  5/28- will add tramadol per pt request prn- 50 mg q6 hours prn  6/1- still using just tylenol scheduled- will ask therapy if pain is affecting therapy.  3. Mood: Hx of anxiety > depression- will continue Prozac that she's been on for 20 years- might need to increase it and might need to see Neuropsychology.  4. Patient is not able to make decisions for herself due to stroke and per family, recent reduction in mentation.  5. Hypotension/bradycardia: decrease Norvasc to 2.5mg  6. HLD- will continue statin that was started per Neurology 7. L sacral ala fracture- is WBAT on B/L LE's, however will treat pain, since tx is conservative. Might require Tramadol.  8. Constipation- is chronic for pt- will schedule Miralax QOD for now; however if add tramadol, will make daily.   6/1- LBM yesterday afternoon- not clear if  took sorbitol or just went.   6/2- LBM 2 days ago- if no BM by tomorrow, will need intervention 9. Chronic foley due to bladder spasms- just changed on day of admission- pt accidentally pulled it out- will try to use leg bag as much as possible.  10.  Recent kyphoplasty 07/08/21- T8/T12/L1 for compression fractures.  11. Osteoporosis/Vit D Deficiency- con't Vit D and monitor for falls- since has hx of frequent falls.  12. Sundowning at home- will monitor  for behavior- might need something for sundowning.  13. Insomnia  5/28- will add Trazodone 25 mg QHS for sleep- might need more, but since 88, will start low.   5/29 Reports sleep improved, monitor  5/30- didn't sleep as well- will increase trazodone to 50 mg QHS  5/31- per pt request, will change back to 25 mg QHS.   6/2- sleeping much better per pt- con't regimen 14.Mild Azotemia  -overall stable, advised to drink fluids  6/1- BUN 26 and Cr 1.02- will recheck again in Am and if getting worse, will give IVFs.   6/2- BUN still 26- will recheck Monday and give IVFs if needed.      LOS: 7 days A FACE TO FACE EVALUATION WAS PERFORMED  Drema Pry Lylie Blacklock 08/24/2021, 2:35 PM

## 2021-08-25 NOTE — Progress Notes (Signed)
Occupational Therapy Weekly Progress Note  Patient Details  Name: Marie Mitchell MRN: [id number] Date of Birth: [birth year]  Beginning of progress report period: Aug 18, 2021 End of progress report period: August 24, 2021  Patient has met 3 of 4 short term goals.  Pt is making slow but steady progress. Pt requires more then a reasonable amount of time to initiate and complete ADL tasks. Mod A for bathing. Min A for UB dressing. Mod A for LB dressing. Functional transfers with min A/CGA. Sit<>stand with min A and mod verbal cues for sequencing/safety. CGA for standing balance. Pt's daughter scheduled for education on 6/5.  Patient continues to demonstrate the following deficits: muscle weakness, decreased cardiorespiratoy endurance, impaired timing and sequencing, unbalanced muscle activation, and decreased coordination, and decreased sitting balance, decreased standing balance, decreased postural control, and decreased balance strategies and therefore will continue to benefit from skilled OT intervention to enhance overall performance with BADL and Reduce care partner burden.  Patient progressing toward long term goals..  Continue plan of care.  OT Short Term Goals Week 1:  OT Short Term Goal 1 (Week 1): Pt will use AE as needed to thread BLEs into LB clothing with no more than supervision for sitting balance OT Short Term Goal 1 - Progress (Week 1): Met OT Short Term Goal 2 (Week 1): Pt will complete sit > stand in prep for ADL task with min A using LRAD OT Short Term Goal 2 - Progress (Week 1): Met OT Short Term Goal 3 (Week 1): Pt will use compensatory technqiue to donn bra with supervision and verbal cues as needed OT Short Term Goal 3 - Progress (Week 1): Progressing toward goal OT Short Term Goal 4 (Week 1): Pt will complete 1/3 steps during toileting with no more than mod A for standing balance OT Short Term Goal 4 - Progress (Week 1): Met Week 2:  OT Short Term Goal 1 (Week 2): STG=LTG  2/2 ELOS   Leotis Shames [facility] 08/25/2021, 10:44 AM

## 2021-08-25 NOTE — Plan of Care (Signed)
  Problem: Consults Goal: RH STROKE PATIENT EDUCATION Description: See Patient Education module for education specifics  Outcome: Progressing   Problem: RH BLADDER ELIMINATION Goal: RH STG MANAGE BLADDER WITH EQUIPMENT WITH ASSISTANCE Description: STG Manage Bladder With Equipment With max Assistance Outcome: Progressing   Problem: RH SKIN INTEGRITY Goal: RH STG MAINTAIN SKIN INTEGRITY WITH ASSISTANCE Description: STG Maintain Skin Integrity With min Assistance. Outcome: Progressing   Problem: RH SAFETY Goal: RH STG ADHERE TO SAFETY PRECAUTIONS W/ASSISTANCE/DEVICE Description: STG Adhere to Safety Precautions With Cues and Reminders. Outcome: Progressing   Problem: RH COGNITION-NURSING Goal: RH STG USES MEMORY AIDS/STRATEGIES W/ASSIST TO PROBLEM SOLVE Description: STG Uses Memory Aids/Strategies With min Assistance to Problem Solve. Outcome: Progressing Goal: RH STG ANTICIPATES NEEDS/CALLS FOR ASSIST W/ASSIST/CUES Description: STG Anticipates Needs/Calls for Assist With Cues and Reminders. Outcome: Progressing   Problem: RH KNOWLEDGE DEFICIT Goal: RH STG INCREASE KNOWLEDGE OF STROKE PROPHYLAXIS Description: Patient will demonstrate knowledge of medications used to prevent future strokes with educational materials and handouts provided by staff with min assist at discharge. Outcome: Progressing   

## 2021-08-25 NOTE — Progress Notes (Signed)
Occupational Therapy Session Note  Patient Details  Name: Marie Mitchell MRN: [id number] Date of Birth: 01/15/1934  Today's Date: 08/25/2021 OT Individual Time: 5638-9373 OT Individual Time Calculation (min): 71 min    Short Term Goals: Week 2:  OT Short Term Goal 1 (Week 2): STG=LTG 2/2 ELOS  Skilled Therapeutic Interventions/Progress Updates:    Pt resting in w/c upon arrival and ready for therapy. OT intervention with focus on sit<>stand, standing balance, selective attention, functional amb with RW, activity tolerance, and safety awareness to increase independence with BADLs. Amb with RW at [facility] 1x15' and 1x30'. Sit<>stand with min A and max verbal cues for sequencing and weight shifts. Pt demonstrates difficulty with shifting weight over her feet when standing and has propensity to stand with weight through heels. Focus on anterior weight shift when standing. Pt engaged in [place] activities with delayed reaction: scanning-2.6 secs and sequencing with numbers 15.6 seconds. Sit<>stand X 5 with focus on technique and weight shifts. Pt returned to room and remained in w/c with belt alarm activated. All needs within reach.   Therapy Documentation Precautions:  Precautions Precautions: Fall Restrictions Weight Bearing Restrictions: No LUE Weight Bearing: Weight bearing as tolerated LLE Weight Bearing: Weight bearing as tolerated  Pain: Pain Assessment Pain Scale: 0-10 Pain Score: 0-No pain   Therapy/Group: Individual Therapy  Rich Brave 08/25/2021, 11:59 AM

## 2021-08-25 NOTE — Progress Notes (Signed)
PROGRESS NOTE   Subjective/Complaints: On commode and needs assistance Expresses no complaints  ROS:  Pt denies SOB, abd pain, CP, N/V/C/D, and vision changes  Objective:   No results found. No results for input(s): WBC, HGB, HCT, PLT in the last 72 hours.  Recent Labs    08/23/21 0541  NA 139  K 3.9  CL 106  CO2 29  GLUCOSE 95  BUN 26*  CREATININE 0.98  CALCIUM 9.3    Intake/Output Summary (Last 24 hours) at 08/25/2021 1703 Last data filed at 08/25/2021 1254 Gross per 24 hour  Intake 956 ml  Output 1650 ml  Net -694 ml        Physical Exam: Vital Signs Blood pressure 118/62, pulse 61, temperature 97.8 F (36.6 C), resp. rate 17, height 5\' 7"  (1.702 m), weight 60.8 kg, SpO2 100 %.   Gen: no distress, normal appearing HEENT: oral mucosa pink and moist, NCAT Cardio: Reg rate Chest: normal effort, normal rate of breathing Abd: soft, non-distended Ext: no edema Psychiatric: appropriate- flat but calm Neurological: alert, however poor memory more evident this AM- didn't recognize interviewer/doctor Genitourinary:    Comments: Has foley in place- medium amber urine in bag- no leakage Musculoskeletal:     Cervical back: Neck supple. No tenderness.     Comments: RUE- deltoid, biceps, triceps 4+/5; otherwise 5-/5 LUE 5/5  RLE- HF 4+/5 and R PF 4+/5; otherwise 5-/5 LLE- 5-/5 throughout  Skin:    General: Skin is warm and dry.     Comments: L forearm IV- a little blood flash in IV- looks OK otherwise  Neurological:     Mental Status: She is alert.     Comments: Limited historian Intact to light touch in all 4 extremities and face Ox2  Assessment/Plan: 1. Functional deficits which require 3+ hours per day of interdisciplinary therapy in a comprehensive inpatient rehab setting. Physiatrist is providing close team supervision and 24 hour management of active medical problems listed below. Physiatrist and  rehab team continue to assess barriers to discharge/monitor patient progress toward functional and medical goals  Care Tool:  Bathing    Body parts bathed by patient: Right arm, Left arm, Chest, Abdomen, Front perineal area, Right upper leg, Left upper leg, Buttocks, Face   Body parts bathed by helper: Right lower leg, Left lower leg     Bathing assist Assist Level: Minimal Assistance - Patient > 75%     Upper Body Dressing/Undressing Upper body dressing   What is the patient wearing?: Pull over shirt    Upper body assist Assist Level: Moderate Assistance - Patient 50 - 74%    Lower Body Dressing/Undressing Lower body dressing      What is the patient wearing?: Pants     Lower body assist Assist for lower body dressing: Maximal Assistance - Patient 25 - 49%     Toileting Toileting Toileting Activity did not occur (Clothing management and hygiene only):  (for foley care)  Toileting assist Assist for toileting: Dependent - Patient 0%     Transfers Chair/bed transfer  Transfers assist     Chair/bed transfer assist level: Minimal Assistance - Patient > 75% Chair/bed transfer  assistive device: ArboriculturistWalker   Locomotion Ambulation   Ambulation assist      Assist level: Minimal Assistance - Patient > 75% Assistive device: Walker-rolling Max distance: 12525ft   Walk 10 feet activity   Assist  Walk 10 feet activity did not occur: Safety/medical concerns        Walk 50 feet activity   Assist Walk 50 feet with 2 turns activity did not occur: Safety/medical concerns         Walk 150 feet activity   Assist Walk 150 feet activity did not occur: Safety/medical concerns         Walk 10 feet on uneven surface  activity   Assist Walk 10 feet on uneven surfaces activity did not occur: Safety/medical concerns         Wheelchair     Assist Is the patient using a wheelchair?: Yes Type of Wheelchair: Manual    Wheelchair assist level: Total  Assistance - Patient < 25% Max wheelchair distance: >150 ft    Wheelchair 50 feet with 2 turns activity    Assist        Assist Level: Total Assistance - Patient < 25%   Wheelchair 150 feet activity     Assist      Assist Level: Total Assistance - Patient < 25%   Blood pressure 118/62, pulse 61, temperature 97.8 F (36.6 C), resp. rate 17, height 5\' 7"  (1.702 m), weight 60.8 kg, SpO2 100 %.  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 with R hemiparesis ELOS 16-20 days min A Patient able to shower. D/c set for 6/9- hopefully to go directly back to Assisted living Continue CIR- PT , OT and SLP 2. DVT Prophylaxis/Anticoagulation: Pharmaceutical: Lovenox             On ASA and Plavix- Plavix for 21 days 2. Pain Management: Continue scheduled tylenol 325 mg q6 hours and con't prn as well- might need tramadol, however makes her constipated  5/28- will add tramadol per pt request prn- 50 mg q6 hours prn  6/1- still using just tylenol scheduled- will ask therapy if pain is affecting therapy.  3. Mood: Hx of anxiety > depression- will continue Prozac that she's been on for 20 years- might need to increase it and might need to see Neuropsychology.  4. Patient is not able to make decisions for herself due to stroke and per family, recent reduction in mentation.  5. Hypotension/bradycardia: decrease Norvasc to 2.5mg  6. HLD- will continue statin that was started per Neurology 7. L sacral ala fracture- is WBAT on B/L LE's, however will treat pain, since tx is conservative. Might require Tramadol.  8. Constipation- is chronic for pt- will schedule Miralax QOD for now; however if add tramadol, will make daily.   6/1- LBM yesterday afternoon- not clear if took sorbitol or just went.   6/2- LBM 2 days ago- if no BM by tomorrow, will need intervention 9. Chronic foley due to bladder spasms- just changed on day of  admission- pt accidentally pulled it out- will try to use leg bag as much as possible.  10.  Recent kyphoplasty 07/08/21- T8/T12/L1 for compression fractures.  11. Osteoporosis/Vit D Deficiency- continue Vit D and monitor for falls- since has hx of frequent falls.  12. Sundowning at home- will monitor for behavior- might need something for sundowning.  13. Insomnia  5/28- will add Trazodone 25 mg QHS for sleep- might need more, but since 88,  will start low.   5/29 Reports sleep improved, monitor  5/30- didn't sleep as well- will increase trazodone to 50 mg QHS  5/31- per pt request, will change back to 25 mg QHS.   Sleeping much better per pt- continue regimen 14.Mild Azotemia  -overall stable, advised to drink fluids  6/1- BUN 26 and Cr 1.02- will recheck again in Am and if getting worse, will give IVFs.   6/2- BUN still 26- will recheck Monday and give IVFs if needed.      LOS: 8 days A FACE TO FACE EVALUATION WAS PERFORMED  Clint Bolder P Sugar [place]t 08/25/2021, 5:03 PM

## 2021-08-26 LAB — CBC WITH DIFFERENTIAL/PLATELET
Abs Immature Granulocytes: 0.02 10*3/uL (ref 0.00–0.07)
Basophils Absolute: 0 10*3/uL (ref 0.0–0.1)
Basophils Relative: 1 %
Eosinophils Absolute: 0.3 10*3/uL (ref 0.0–0.5)
Eosinophils Relative: 5 %
HCT: 38.4 % (ref 36.0–46.0)
Hemoglobin: 12.4 g/dL (ref 12.0–15.0)
Immature Granulocytes: 0 %
Lymphocytes Relative: 13 %
Lymphs Abs: 0.6 10*3/uL — ABNORMAL LOW (ref 0.7–4.0)
MCH: 30.6 pg (ref 26.0–34.0)
MCHC: 32.3 g/dL (ref 30.0–36.0)
MCV: 94.8 fL (ref 80.0–100.0)
Monocytes Absolute: 0.5 10*3/uL (ref 0.1–1.0)
Monocytes Relative: 10 %
Neutro Abs: 3.6 10*3/uL (ref 1.7–7.7)
Neutrophils Relative %: 71 %
Platelets: 315 10*3/uL (ref 150–400)
RBC: 4.05 MIL/uL (ref 3.87–5.11)
RDW: 13.1 % (ref 11.5–15.5)
WBC: 5 10*3/uL (ref 4.0–10.5)
nRBC: 0 % (ref 0.0–0.2)

## 2021-08-26 LAB — COMPREHENSIVE METABOLIC PANEL
ALT: 49 U/L — ABNORMAL HIGH (ref 0–44)
AST: 36 U/L (ref 15–41)
Albumin: 3.3 g/dL — ABNORMAL LOW (ref 3.5–5.0)
Alkaline Phosphatase: 67 U/L (ref 38–126)
Anion gap: 5 (ref 5–15)
BUN: 24 mg/dL — ABNORMAL HIGH (ref 8–23)
CO2: 27 mmol/L (ref 22–32)
Calcium: 9.8 mg/dL (ref 8.9–10.3)
Chloride: 109 mmol/L (ref 98–111)
Creatinine, Ser: 0.97 mg/dL (ref 0.44–1.00)
GFR, Estimated: 57 mL/min — ABNORMAL LOW (ref >60)
Glucose, Bld: 92 mg/dL (ref 70–99)
Potassium: 4.3 mmol/L (ref 3.5–5.1)
Sodium: 141 mmol/L (ref 135–145)
Total Bilirubin: 0.6 mg/dL (ref 0.3–1.2)
Total Protein: 6.3 g/dL — ABNORMAL LOW (ref 6.5–8.1)

## 2021-08-26 MED ORDER — AMANTADINE HCL 100 MG PO CAPS
100.0000 mg | ORAL_CAPSULE | Freq: Every day | ORAL | Status: DC
  Administered 2021-08-26 – 2021-08-29 (×4): 100 mg via ORAL
  Filled 2021-08-26 (×5): qty 1

## 2021-08-26 MED ORDER — SENNOSIDES-DOCUSATE SODIUM 8.6-50 MG PO TABS
2.0000 | ORAL_TABLET | ORAL | Status: DC
  Administered 2021-08-26 – 2021-08-28 (×3): 2 via ORAL
  Filled 2021-08-26 (×2): qty 2

## 2021-08-26 NOTE — Progress Notes (Signed)
PROGRESS NOTE   Subjective/Complaints:  Sleepy this AM.  Had leg pain this weekend, but not hurting this AM.   Says slept well. But is sleepy this AM.  Ate 100% tray.  ROS:  Pt denies SOB, abd pain, CP, N/V/C/D, and vision changes   Objective:   No results found. No results for input(s): WBC, HGB, HCT, PLT in the last 72 hours.  No results for input(s): NA, K, CL, CO2, GLUCOSE, BUN, CREATININE, CALCIUM in the last 72 hours.   Intake/Output Summary (Last 24 hours) at 08/26/2021 0908 Last data filed at 08/26/2021 0800 Gross per 24 hour  Intake 895 ml  Output 2100 ml  Net -1205 ml        Physical Exam: Vital Signs Blood pressure (!) 97/53, pulse 62, temperature 97.9 F (36.6 C), temperature source Oral, resp. rate 16, height 5\' 7"  (1.702 m), weight 60.8 kg, SpO2 97 %.    General: very sleepy; appropriate, laying supine; NAD HENT: conjugate gaze; oropharynx dry CV: regular rate; no JVD Pulmonary: CTA B/L; no W/R/R- good air movement GI: soft, NT, ND, (+)BS- hypoactive- ate 100% tray. Psychiatric: appropriate Neurological: sleepy-  Genitourinary:    Comments: Has foley in place- medium amber urine in bag- no leakage this AM Musculoskeletal:     Cervical back: Neck supple. No tenderness.     Comments: RUE- deltoid, biceps, triceps 4+/5; otherwise 5-/5 LUE 5/5  RLE- HF 4+/5 and R PF 4+/5; otherwise 5-/5 LLE- 5-/5 throughout  Skin:    General: Skin is warm and dry.     Comments: L forearm IV- a little blood flash in IV- looks OK otherwise  Neurological:     Mental Status: She is alert.     Comments: Limited historian Intact to light touch in all 4 extremities and face Ox2  Assessment/Plan: 1. Functional deficits which require 3+ hours per day of interdisciplinary therapy in a comprehensive inpatient rehab setting. Physiatrist is providing close team supervision and 24 hour management of active medical  problems listed below. Physiatrist and rehab team continue to assess barriers to discharge/monitor patient progress toward functional and medical goals  Care Tool:  Bathing    Body parts bathed by patient: Right arm, Left arm, Chest, Abdomen, Front perineal area, Right upper leg, Left upper leg, Buttocks, Face   Body parts bathed by helper: Right lower leg, Left lower leg     Bathing assist Assist Level: Minimal Assistance - Patient > 75%     Upper Body Dressing/Undressing Upper body dressing   What is the patient wearing?: Pull over shirt    Upper body assist Assist Level: Moderate Assistance - Patient 50 - 74%    Lower Body Dressing/Undressing Lower body dressing      What is the patient wearing?: Pants     Lower body assist Assist for lower body dressing: Maximal Assistance - Patient 25 - 49%     Toileting Toileting Toileting Activity did not occur (Clothing management and hygiene only):  (for foley care)  Toileting assist Assist for toileting: Dependent - Patient 0%     Transfers Chair/bed transfer  Transfers assist     Chair/bed transfer assist level:  Minimal Assistance - Patient > 75% Chair/bed transfer assistive device: ArboriculturistWalker   Locomotion Ambulation   Ambulation assist      Assist level: Minimal Assistance - Patient > 75% Assistive device: Walker-rolling Max distance: 13025ft   Walk 10 feet activity   Assist  Walk 10 feet activity did not occur: Safety/medical concerns        Walk 50 feet activity   Assist Walk 50 feet with 2 turns activity did not occur: Safety/medical concerns         Walk 150 feet activity   Assist Walk 150 feet activity did not occur: Safety/medical concerns         Walk 10 feet on uneven surface  activity   Assist Walk 10 feet on uneven surfaces activity did not occur: Safety/medical concerns         Wheelchair     Assist Is the patient using a wheelchair?: Yes Type of Wheelchair: Manual     Wheelchair assist level: Total Assistance - Patient < 25% Max wheelchair distance: >150 ft    Wheelchair 50 feet with 2 turns activity    Assist        Assist Level: Total Assistance - Patient < 25%   Wheelchair 150 feet activity     Assist      Assist Level: Total Assistance - Patient < 25%   Blood pressure (!) 97/53, pulse 62, temperature 97.9 F (36.6 C), temperature source Oral, resp. rate 16, height 5\' 7"  (1.702 m), weight 60.8 kg, SpO2 97 %.  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 with R hemiparesis ELOS 16-20 days min A Patient able to shower. D/c set for 6/9-  Con't CIR- PT, OT and SLP 2. DVT Prophylaxis/Anticoagulation: Pharmaceutical: Lovenox             On ASA and Plavix- Plavix for 21 days 2. Pain Management: Continue scheduled tylenol 325 mg q6 hours and con't prn as well- might need tramadol, however makes her constipated  5/28- will add tramadol per pt request prn- 50 mg q6 hours prn  6/1- still using just tylenol scheduled- will ask therapy if pain is affecting therapy.   6/5- not taking tramadol- con't regimen 3. Mood: Hx of anxiety > depression- will continue Prozac that she's been on for 20 years- might need to increase it and might need to see Neuropsychology.  4. Patient is not able to make decisions for herself due to stroke and per family, recent reduction in mentation.  5. Hypotension/bradycardia: decrease Norvasc to 2.5mg  6. HLD- will continue statin that was started per Neurology 7. L sacral ala fracture- is WBAT on B/L LE's, however will treat pain, since tx is conservative. Might require Tramadol.  8. Constipation- is chronic for pt- will schedule Miralax QOD for now; however if add tramadol, will make daily.   6/1- LBM yesterday afternoon- not clear if took sorbitol or just went.   6/2- LBM 2 days ago- if no BM by tomorrow, will need intervention  6/5- LAST  BM was 6/4 9. Chronic foley due to bladder spasms- just changed on day of admission- pt accidentally pulled it out- will try to use leg bag as much as possible.   6/5- had to be changed yesterday due to leakage? 10.  Recent kyphoplasty 07/08/21- T8/T12/L1 for compression fractures.  11. Osteoporosis/Vit D Deficiency- continue Vit D and monitor for falls- since has hx of frequent falls.  12. Sundowning at home- will  monitor for behavior- might need something for sundowning.  13. Insomnia  5/28- will add Trazodone 25 mg QHS for sleep- might need more, but since 88, will start low.   5/29 Reports sleep improved, monitor  5/30- didn't sleep as well- will increase trazodone to 50 mg QHS  5/31- per pt request, will change back to 25 mg QHS.   Sleeping much better per pt- continue regimen 14.Mild Azotemia  -overall stable, advised to drink fluids  6/1- BUN 26 and Cr 1.02- will recheck again in Am and if getting worse, will give IVFs.   6/2- BUN still 26- will recheck Monday and give IVFs if needed.  15. Sleepiness during day  6/5- will try Amantadine 100 mg daily.        LOS: 9 days A FACE TO FACE EVALUATION WAS PERFORMED  Marie Mitchell 08/26/2021, 9:08 AM

## 2021-08-26 NOTE — Progress Notes (Signed)
Patient ID: Marie Mitchell, female   DOB: [birth year], 88 y.o.   MRN: [id number]  SW spoke with pt dtr who confirms that they would like to move forward with [facility]. Reports she will follow-up with [name] and will call [facility] as well and update SW.   Coby Ingram/LTC with [name] [phone]) to inform on pt likely to transition, and pt will need to continue to have therapy. Reports pt will be accepted into their LTC and can have therapy needed. SW shared pt dtr will f/u with her this afternoon to discuss further.   Loralee Pacas, MSW, [place] Office: [phone] Cell: [phone] Fax: [phone]

## 2021-08-26 NOTE — Progress Notes (Signed)
Existing foley catheter discontinued and a new 14Fr  foley catheter inserted. Noted clear urine draining. Patient tolerated procedure.

## 2021-08-26 NOTE — Progress Notes (Signed)
Speech Language Pathology Weekly Progress and Session Note  Patient Details  Name: Marie Mitchell MRN: [id number] Date of Birth: 03-14-1934  Beginning of progress report period: Aug 19, 2021 End of progress report period: August 26, 2021  Today's Date: 08/26/2021 SLP Individual Time: 5520-8022 SLP Individual Time Calculation (min): 30 min  Short Term Goals: Week 1: SLP Short Term Goal 1 (Week 1): Patient will consume current diet with minimal overt s/sx of aspiration with min A verbal cues for implementation of safe swallowing precautions/strategies SLP Short Term Goal 1 - Progress (Week 1): Met SLP Short Term Goal 2 (Week 1): Patient will participate in further language assessment SLP Short Term Goal 2 - Progress (Week 1): Met SLP Short Term Goal 3 (Week 1): Patient will complete convergent and divergent naming tasks with min A verbal cues to achieve 80% accuracy SLP Short Term Goal 3 - Progress (Week 1): Not met SLP Short Term Goal 4 (Week 1): Patient will implement speech intelligiblity strategies at the sentence level to achieve >80 intelligiblity with min A verbal cues SLP Short Term Goal 4 - Progress (Week 1): Met    New Short Term Goals: Week 2: SLP Short Term Goal 1 (Week 2): STG=LTG due to ELOS (6/9)  Weekly Progress Updates: Pt made good progress meeting 3 out 4 goals this reporting period. Pt is tolerating regular textures and thin liquids with extra time for bolus preparation and supervision A verbal cues for swallow strategies. Pt has participated in further language assessment with continued focus on higher level word finding in abstract conversation as well as divergent/convergent naming. Pt demonstrates improvement in speech intelligibility at the sentence level with 85% intelligibility with min A verbal cues. Pt's barriers at discharge are diet tolerance, speech intelligibly and higher level word finding. Education has been completed.  Pt would continue to benefit from skilled  ST services in order to maximize functional independence and reduce burden of care, with continued skilled ST services.      Intensity: Minumum of 1-2 x/day, 30 to 90 minutes Frequency: 3 to 5 out of 7 days Duration/Length of Stay: 6/9 Treatment/Interventions: Speech/Language facilitation;Cueing hierarchy;Dysphagia/aspiration precaution training;Patient/family education;Therapeutic Activities   Daily Session  Skilled Therapeutic Interventions:  Skilled ST services focused on education and language skills. Pt's daughter and son were present for education. SLP facilitated education on current swallow function (requiring small bites/sips and use of liquid wash to clear oral cavity) when consuming regular textures and thin liquids, higher level word finding strategies (with abstract topics as well as divergent/convergent naming), strategies for speech intelligibility at the sentence level and the impacted of delayed processing impacting communication. Family asked appropriate questions and were engaged in education session. Pt demonstrated moderate word finding deficits in divergent naming task and only required supervision A semantic cues/extra time in convergent naming tasks. All questions answered to satisfaction. Pt was left in room with family, call bell within reach and chair alarm set. SLP recommends to continue skilled services.     General    Pain Pain Assessment Pain Scale: 0-10 Pain Score: 0-No pain  Therapy/Group: Individual Therapy  Furqan Gosselin  [facility] 08/26/2021, 1:03 PM

## 2021-08-26 NOTE — Progress Notes (Signed)
Occupational Therapy Session Note  Patient Details  Name: Marie Mitchell MRN: [id number] Date of Birth: [birth year]  Today's Date: 08/26/2021 OT Individual Time: [id number] OT Individual Time Calculation (min): 41 min  and Today's Date: 08/26/2021 OT Missed Time: 19 Minutes Missed Time Reason: Other (comment)   Short Term Goals: Week 2:  OT Short Term Goal 1 (Week 2): STG=LTG 2/2 ELOS  Skilled Therapeutic Interventions/Progress Updates:    Pt resting in w/c upon arrival with daughter and son present for education. Discussed pt's delayed reaction and delayed transitions as noted yesterday during [place] activities, which reinforces team recommendation for 24 hour supervision. Sit<>stand from w/c with min A for balance and max verbal cues for sequencing. Amb in room with RW with CGA. Pt completed grooming tasks seated in w/c at sink. Recommended continued therapy services. Pt's daughter reported that they have received a bed offer from [facility] available at end of week. Pt remained in w/c with all needs within reach. Family present.   Therapy Documentation Precautions:  Precautions Precautions: Fall Restrictions Weight Bearing Restrictions: No LUE Weight Bearing: Weight bearing as tolerated LLE Weight Bearing: Weight bearing as tolerated General: General OT Amount of Missed Time: 19 Minutes Pain:  Pt denies pain this morning but reported that she did experience some BLE pain during the night; MD aware   Therapy/Group: Individual Therapy  Leroy Libman 08/26/2021, 10:04 AM

## 2021-08-26 NOTE — Progress Notes (Signed)
Occupational Therapy Session Note  Patient Details  Name: Marie Mitchell MRN: [id number] Date of Birth: 1933-07-20  Today's Date: 08/26/2021 OT Individual Time: 1345-1430 OT Individual Time Calculation (min): 45 min    Short Term Goals: Week 2:  OT Short Term Goal 1 (Week 2): STG=LTG 2/2 ELOS  Skilled Therapeutic Interventions/Progress Updates:    OT intervention with focus on BADL retraining, standing balance, sit<>stand, and safety awareness to increase independence with BADLs. Pt completed bathing/dressing with sit<>stand from w/c at sink. Min A for sit<>stand and CGA for standing balance. Min A for bahting and mod A for all dressing tasks. Pt requires more then a reasonable amount of time to complete tasks. Pt remained in w/c with all needs within reach and belt alarm activated.   Therapy Documentation Precautions:  Precautions Precautions: Fall Restrictions Weight Bearing Restrictions: No LUE Weight Bearing: Weight bearing as tolerated LLE Weight Bearing: Weight bearing as tolerated  Pain: Pain Assessment Pain Scale: 0-10 Pain Score: 0-No pain     Therapy/Group: Individual Therapy  Rich Brave 08/26/2021, 2:43 PM

## 2021-08-26 NOTE — Progress Notes (Signed)
Physical Therapy Session Note  Patient Details  Name: Frimet Durfee MRN: [id number] Date of Birth: 01/30/1934  Today's Date: 08/26/2021 PT Individual Time: 1300-1330 PT Individual Time Calculation (min): 30 min   Short Term Goals: Week 1:  PT Short Term Goal 1 (Week 1): Patient will perform transfers consistently with min A using LRAD PT Short Term Goal 2 (Week 1): Patient will ambulate >200 ft using LRAD with CGA consistently PT Short Term Goal 3 (Week 1): Patient will improve score on Berg Balance Scale by 7 points to meet MCID Week 2:   =LTG due to ELOS  Skilled Therapeutic Interventions/Progress Updates:    Pt seen for makeup therapy session. Pt received seated in bed having just finished lunch, agreeable to PT session. No complaints of pain. Seated in bed to sitting EOB with Supervision. Sit to stand with min A to RW during session, cues for safe hand placement and anterior weight shift forwards. Stand pivot transfer to w/c with RW and CGA, cues for sequencing and safety. Reassessed Berg. Patient demonstrates increased fall risk as noted by score of 19/56 on Berg Balance Scale.  (<36= high risk for falls, close to 100%; 37-45 significant >80%; 46-51 moderate >50%; 52-55 lower >25%), improvement from 5/56 on 08/18/21. Reviewed score and functional implications. Ambulation x 150 ft with RW and CGA for balance with cues to keep RW closer to body during gait and for obstacle avoidance. Pt left seated in w/c in room with needs in reach at end of session.  Therapy Documentation Precautions:  Precautions Precautions: Fall Restrictions Weight Bearing Restrictions: No LUE Weight Bearing: Weight bearing as tolerated LLE Weight Bearing: Weight bearing as tolerated Balance: Balance Balance Assessed: Yes Standardized Balance Assessment Standardized Balance Assessment: Berg Balance Test Berg Balance Test Sit to Stand: Needs minimal aid to stand or to stabilize Standing Unsupported: Able to  stand 2 minutes with supervision Sitting with Back Unsupported but Feet Supported on Floor or Stool: Able to sit safely and securely 2 minutes Stand to Sit: Uses backs of legs against chair to control descent Transfers: Needs one person to assist Standing Unsupported with Eyes Closed: Able to stand 10 seconds with supervision Standing Ubsupported with Feet Together: Able to place feet together independently and stand for 1 minute with supervision From Standing, Reach Forward with Outstretched Arm: Reaches forward but needs supervision From Standing Position, Pick up Object from Floor: Unable to try/needs assist to keep balance From Standing Position, Turn to Look Behind Over each Shoulder: Needs assist to keep from losing balance and falling Turn 360 Degrees: Needs assistance while turning Standing Unsupported, Alternately Place Feet on Step/Stool: Needs assistance to keep from falling or unable to try Standing Unsupported, One Foot in Front: Needs help to step but can hold 15 seconds Standing on One Leg: Unable to try or needs assist to prevent fall Total Score: 19    Therapy/Group: Individual Therapy   Peter Congo, PT, DPT, CSRS 08/26/2021, 1:37 PM

## 2021-08-26 NOTE — Progress Notes (Signed)
Physical Therapy Weekly Progress Note  Patient Details  Name: Marie Mitchell MRN: [id number] Date of Birth: 21-Jan-1934  Beginning of progress report period: Aug 18, 2021 End of progress report period: August 26, 2021  Today's Date: 08/26/2021 PT Individual Time: 1100-1200 PT Individual Time Calculation (min): 60 min   Patient has met 2 of 3 short term goals.  Overall pt is making good progress towards therapy goals. She is currently at Supervision for bed mobility with use of hospital bed features, min A for sit to stand and transfers with RW due to posterior lean, and CGA for gait up to 150 ft with cues needed for safe RW management and environment navigation. Pt's daughter and son completed family education and able to visualize pt's current assist level. Demonstrated pt's current assist level with gait and transfers and discussed other areas of concern including being able to get in/out of daughter's [name] with several STE. Due to family time constraints no hands-on practice this session but pt's daughter assisted her at home prior to admission and is aware of her posterior lean and difficulty managing turns.  Patient continues to demonstrate the following deficits muscle weakness, decreased cardiorespiratoy endurance, decreased motor planning, decreased attention, decreased awareness, decreased problem solving, decreased safety awareness, decreased memory, and delayed processing, and decreased sitting balance, decreased standing balance, decreased postural control, hemiplegia, and decreased balance strategies and therefore will continue to benefit from skilled PT intervention to increase functional independence with mobility.   Patient not progressing toward long term goals.  See goal revision..  Plan of care revisions: downgraded goals to Supervision overall for mobility and transfers and decreased gait goal distance to 150 ft due to slow progress.  PT Short Term Goals Week 1:  PT Short Term Goal 1  (Week 1): Patient will perform transfers consistently with min A using LRAD PT Short Term Goal 1 - Progress (Week 1): Met PT Short Term Goal 2 (Week 1): Patient will ambulate >200 ft using LRAD with CGA consistently PT Short Term Goal 2 - Progress (Week 1): Progressing toward goal PT Short Term Goal 3 (Week 1): Patient will improve score on Berg Balance Scale by 7 points to meet MCID PT Short Term Goal 3 - Progress (Week 1): Met Week 2:  PT Short Term Goal 1 (Week 2): =LTG due to ELOS  Skilled Therapeutic Interventions/Progress Updates:    Pt received seated in w/c in room with daughter Marie Mitchell) and son Marie Mitchell) present for hands-on family education session. 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 Pt's family understanding of her current deficits and per daughter's report pt had posterior bias prior to admission as well as difficulty with turning. Pt performs sit to stand with min A to RW during session, cues for anterior weight shift and safe hand placement. Ambulation up to 150 ft with RW and CGA for balance with cues to keep RW closer to body during gait and for obstacle avoidance. Trial gait with rollator as pt utilized this at home prior to admission. Pt exhibits good balance with use of rollator, however needs cues to keep AD closer to her body with gait and turns and needs step-by-step cueing for brake management. Recommending continued use of RW upon d/c from hospital. Ambulation through obstacle course weaving through cones with use of RW and CGA for balance, cues at times for  obstacle avoidance. Standing alt L/R 4" step-taps with RW and CGA for balance, 2 x 10 reps. Pt requests to return to bed at end of session. Sit to supine Supervision. Pt left seated in bed with needs in reach, bed alarm in place.  Therapy Documentation Precautions:   Precautions Precautions: Fall Restrictions Weight Bearing Restrictions: No LUE Weight Bearing: Weight bearing as tolerated LLE Weight Bearing: Weight bearing as tolerated         Therapy/Group: Individual Therapy   Excell Seltzer, PT, DPT, CSRS 08/26/2021, 12:52 PM

## 2021-08-26 NOTE — Progress Notes (Signed)
Received call from bedside nurse Neysa Bonito that a significant amount of leakage of urine was found in brief with concern of Foley not working and not positioned properly.  There is still output of urine in the drainage bag, 500 ml this am but her brief was half full with urine.  The patient has some cloudy sediment in her urine but is asymptomatic for urinary symptoms such as dysuria.  Discussed with Dr. Carlis Abbott, MD who advised to replace Foley since urine leakage should not occur if working properly.  No recommendations for UA at this time since patient is asymptomatic and sediment may be due to immobility.

## 2021-08-26 NOTE — Progress Notes (Signed)
NT informed this RN about leakage of urine and saw moderate amount on patient's brief. NT collected 500 cc of cloudy urine from foley bag this morning. RN noted sediments along foley catheter. Patient denies any bladder discomfort. Vital signs WNL. Bladder scan only shows 13 cc of urine. Foley balloon checked with 10cc in it. Charge RN Roselyn Reef and Luetta Nutting informed.

## 2021-08-26 NOTE — Discharge Summary (Signed)
Physician Discharge Summary  Patient ID: Marie EvensBridget Lehr MRN: [id number] DOB/AGE: October 08, 1933 88 y.o.  Admit date: 08/17/2021 Discharge date: 08/29/2021  Discharge Diagnoses:  Principal Problem:   Left basal ganglia embolic stroke [facility](HCC) Active Problems:   Falls frequently   HTN (hypertension)   Anxiety   Urinary retention   Foley catheter in place   Discharged Condition: good  Significant Diagnostic Studies: ECHOCARDIOGRAM COMPLETE  Result Date: 08/15/2021    ECHOCARDIOGRAM REPORT   Patient Name:   Marie EvensBRIDGET Mitchell Date of Exam: 08/15/2021 Medical Rec #:  [id number]       Height:       67.0 in Accession #:    [id number]      Weight:       130.0 lb Date of Birth:  October 08, 1933       BSA:          1.684 m Patient Age:    88 years        BP:           148/90 mmHg Patient Gender: F               HR:           71 bpm. Exam Location:  Inpatient Procedure: 2D Echo, Cardiac Doppler and Color Doppler Indications:    Stroke I63.9  History:        Patient has no prior history of Echocardiogram examinations.                 Stroke; Risk Factors:Hypertension.  Sonographer:    Eulah PontSarah Pirrotta RDCS Referring Phys: [id number] Teddy SpikeYRONE A KYLE IMPRESSIONS  1. Left ventricular ejection fraction, by estimation, is 55 to 60%. Left ventricular ejection fraction by 2D MOD biplane is 59.3 %. The left ventricle has normal function. The left ventricle has no regional wall motion abnormalities. Left ventricular diastolic parameters are consistent with Grade I diastolic dysfunction (impaired relaxation).  2. Right ventricular systolic function is normal. The right ventricular size is normal. There is normal pulmonary artery systolic pressure. The estimated right ventricular systolic pressure is 25.5 mmHg.  3. The mitral valve is grossly normal. Trivial mitral valve regurgitation.  4. The aortic valve is tricuspid. Aortic valve regurgitation is not visualized.  5. The inferior vena cava is normal in size with greater than 50%  respiratory variability, suggesting right atrial pressure of 3 mmHg. Comparison(s): No prior Echocardiogram. FINDINGS  Left Ventricle: Left ventricular ejection fraction, by estimation, is 55 to 60%. Left ventricular ejection fraction by 2D MOD biplane is 59.3 %. The left ventricle has normal function. The left ventricle has no regional wall motion abnormalities. The left ventricular internal cavity size was normal in size. There is no left ventricular hypertrophy. Left ventricular diastolic parameters are consistent with Grade I diastolic dysfunction (impaired relaxation). Indeterminate filling pressures. Right Ventricle: The right ventricular size is normal. No increase in right ventricular wall thickness. Right ventricular systolic function is normal. There is normal pulmonary artery systolic pressure. The tricuspid regurgitant velocity is 2.37 m/s, and  with an assumed right atrial pressure of 3 mmHg, the estimated right ventricular systolic pressure is 25.5 mmHg. Left Atrium: Left atrial size was normal in size. Right Atrium: Right atrial size was normal in size. Pericardium: There is no evidence of pericardial effusion. Mitral Valve: The mitral valve is grossly normal. Trivial mitral valve regurgitation. Tricuspid Valve: The tricuspid valve is grossly normal. Tricuspid valve regurgitation is mild. Aortic Valve: The aortic valve is tricuspid. Aortic valve  regurgitation is not visualized. Pulmonic Valve: The pulmonic valve was normal in structure. Pulmonic valve regurgitation is not visualized. Aorta: The aortic root and ascending aorta are structurally normal, with no evidence of dilitation. Venous: The inferior vena cava is normal in size with greater than 50% respiratory variability, suggesting right atrial pressure of 3 mmHg. IAS/Shunts: No atrial level shunt detected by color flow Doppler.  LEFT VENTRICLE PLAX 2D                        Biplane EF (MOD) LVIDd:         4.30 cm         LV Biplane EF:   Left  LVIDs:         3.10 cm                          ventricular LV PW:         0.90 cm                          ejection LV IVS:        0.70 cm                          fraction by LVOT diam:     2.10 cm                          2D MOD LV SV:         62                               biplane is LV SV Index:   37                               59.3 %. LVOT Area:     3.46 cm                                Diastology                                LV e' medial:    5.48 cm/s LV Volumes (MOD)               LV E/e' medial:  9.7 LV vol d, MOD    56.8 ml       LV e' lateral:   7.20 cm/s A2C:                           LV E/e' lateral: 7.4 LV vol d, MOD    68.8 ml A4C: LV vol s, MOD    23.1 ml A2C: LV vol s, MOD    28.6 ml A4C: LV SV MOD A2C:   33.7 ml LV SV MOD A4C:   68.8 ml LV SV MOD BP:    37.8 ml RIGHT VENTRICLE RV S prime:     11.30 cm/s TAPSE (M-mode): 2.0 cm LEFT ATRIUM             Index        RIGHT ATRIUM  Index LA diam:        3.00 cm 1.78 cm/m   RA Area:     13.70 cm LA Vol (A2C):   45.9 ml 27.26 ml/m  RA Volume:   30.70 ml  18.23 ml/m LA Vol (A4C):   34.0 ml 20.19 ml/m LA Biplane Vol: 41.4 ml 24.59 ml/m  AORTIC VALVE LVOT Vmax:   82.50 cm/s LVOT Vmean:  53.000 cm/s LVOT VTI:    0.180 m  AORTA Ao Root diam: 3.60 cm Ao Asc diam:  3.60 cm MITRAL VALVE               TRICUSPID VALVE MV Area (PHT): 1.91 cm    TR Peak grad:   22.5 mmHg MV Decel Time: 398 msec    TR Vmax:        237.00 cm/s MV E velocity: 53.30 cm/s MV A velocity: 44.80 cm/s  SHUNTS MV E/A ratio:  1.19        Systemic VTI:  0.18 m                            Systemic Diam: 2.10 cm Zoila Shutter MD Electronically signed by Zoila Shutter MD Signature Date/Time: 08/15/2021/4:38:01 PM    Final    CT ANGIO HEAD NECK W WO CM (CODE STROKE)  Result Date: 08/15/2021 CLINICAL DATA:  Stroke, follow-up. EXAM: CT ANGIOGRAPHY HEAD AND NECK TECHNIQUE: Multidetector CT imaging of the head and neck was performed using the standard protocol during bolus  administration of intravenous contrast. Multiplanar CT image reconstructions and MIPs were obtained to evaluate the vascular anatomy. Carotid stenosis measurements (when applicable) are obtained utilizing NASCET criteria, using the distal internal carotid diameter as the denominator. RADIATION DOSE REDUCTION: This exam was performed according to the departmental dose-optimization program which includes automated exposure control, adjustment of the mA and/or kV according to patient size and/or use of iterative reconstruction technique. CONTRAST:  75mL OMNIPAQUE IOHEXOL 350 MG/ML SOLN COMPARISON:  Brain MRI 08/15/2021. FINDINGS: CT HEAD FINDINGS Brain: Mild generalized cerebral atrophy. Redemonstrated 16 mm acute infarct within the left corona radiata. Background moderate patchy and ill-defined hypoattenuation within the cerebral white matter, nonspecific but compatible with chronic small vessel ischemic disease. There is no acute intracranial hemorrhage. No extra-axial fluid collection. No evidence of an intracranial mass. No midline shift. Vascular: No hyperdense vessel.  Atherosclerotic calcifications. Skull: No fracture or aggressive osseous lesion. Sinuses: Trace mucosal thickening scattered within the bilateral ethmoid sinuses. Orbits: No orbital mass or acute orbital finding. Review of the MIP images confirms the above findings CTA NECK FINDINGS Aortic arch: Common origin of the innominate and left common carotid arteries. The visualized aortic arch is normal in caliber. No hemodynamically significant innominate or proximal subclavian artery stenosis. Right carotid system: CCA and ICA patent within the neck without stenosis. No significant atherosclerotic disease. Left carotid system: CCA and ICA patent within the neck without stenosis. No significant atherosclerotic disease. Vertebral arteries: Vertebral arteries patent within the neck without hemodynamically significant stenosis. Skeleton: Cervical  dextrocurvature. Cervical spondylosis. Disc space narrowing is advanced at C5-C6, C6-C7 and C7-T1. No appreciable high-grade spinal canal stenosis. Multilevel bony neural foraminal narrowing. No acute fracture or aggressive osseous lesion. Other neck: Nonspecific 10 mm cutaneous/subcutaneous lesion within the left perimandibular soft tissues (series 13, image 237). Upper chest: No consolidation within the imaged lung apices. Review of the MIP images confirms the above findings CTA HEAD FINDINGS Anterior circulation: The  intracranial internal carotid arteries are patent. The M1 middle cerebral arteries are patent. No M2 proximal branch occlusion or high-grade proximal stenosis. The anterior cerebral arteries are patent. The right A1 segment is developmentally hypoplastic. 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 1-2 mm inferiorly projecting vascular protrusion arising from the supraclinoid left ICA, which may reflect an infundibulum or aneurysm. Posterior circulation: The intracranial vertebral arteries are patent. The basilar artery is patent. The posterior cerebral arteries are patent. The right PCA is fetal in origin. The left posterior communicating artery is diminutive or absent. Mild focal stenosis within the left PCA at the P1/P2 junction. Venous sinuses: Within the limitations of contrast timing, no convincing thrombus. Anatomic variants: As described Review of the MIP images confirms the above findings IMPRESSION: CT head: 1. Redemonstrated 16 mm acute infarct within the left corona radiata. 2. Background moderate chronic small vessel ischemic changes within the cerebral white matter. 3. Mild generalized cerebral atrophy. CTA neck: 1. The common carotid, internal carotid and vertebral arteries are patent within the neck without stenosis or significant atherosclerotic disease. 2. Cervical spondylosis and  cervical dextrocurvature. 3. Nonspecific 10 mm cutaneous/subcutaneous lesion within the left perimandibular soft tissues. Direct visualization recommended. CTA head: 1. No intracranial large vessel occlusion is identified. 2. 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 3. Mild focal stenosis within the left PCA at the P1/P2 junction. Electronically Signed   By: Jackey Loge D.O.   On: 08/15/2021 13:30   MR BRAIN WO CONTRAST  Result Date: 08/15/2021 CLINICAL DATA:  Neuro deficit, acute, stroke suspected EXAM: MRI HEAD WITHOUT CONTRAST TECHNIQUE: Multiplanar, multiecho pulse sequences of the brain and surrounding structures were obtained without intravenous contrast. COMPARISON:  CT head Aug 14, 2021. FINDINGS: Brain: Acute perforator infarct in the left basal ganglia, extending into overlying corona radiata. Slight edema without mass effect. Mild to moderate additional scattered T2/FLAIR hyperintensities in the white matter, nonspecific but compatible with chronic microvascular disease. No evidence of acute hemorrhage, mass lesion, midline shift, hydrocephalus, or extra-axial fluid collection. A few small foci of susceptibility artifact, compatible with prior microhemorrhages. Vascular: Major arterial flow voids are maintained skull base. Skull and upper cervical spine: Normal marrow signal. Sinuses/Orbits: Clear sinuses.  No acute orbital findings. Other: No mastoid effusions IMPRESSION: Acute perforator infarct in the left basal ganglia, extending into overlying corona radiata. Slight edema without mass effect. Electronically Signed   By: Feliberto Harts M.D.   On: 08/15/2021 10:35   MR SACRUM SI JOINTS WO CONTRAST  Result Date: 08/15/2021 CLINICAL DATA:  Hip trauma, left-sided pain. EXAM: MRI LUMBAR SPINE WITHOUT CONTRAST TECHNIQUE: Multiplanar, multisequence MR imaging of the lumbar spine was performed. No intravenous contrast was administered. COMPARISON:   CT hip 08/14/2021 FINDINGS: Patient motion degrades image quality limiting evaluation. Bones/Joint/Cartilage Acute nondisplaced fracture of the inferior aspect of the left sacral ala extending into the S3 vertebral body with associated marrow edema and adjacent soft tissue edema. No other acute fracture or subluxation. Normal alignment. No joint effusion. Mild osteoarthritis of bilateral SI joints. No SI joint widening or erosive changes. No subchondral reactive marrow changes. No SI joint effusion. Partially visualized posterior lumbar fusion at L4-5. Mild broad-based disc bulge at L5-S1 and bilateral facet arthropathy. Lumbar spine is better evaluated on the CT of the lumbar spine performed earlier same day and reported separately. Ligaments, Muscles and Tendons  Mild muscle edema in the left piriformis muscle likely reflecting reactive edema secondary to the adjacent sacral fracture versus mild muscle strain. No intramuscular fluid collection or hematoma. Soft tissue No fluid collection or hematoma. No soft tissue mass. Normal neurovascular bundles. IMPRESSION: 1. Acute nondisplaced fracture of the inferior aspect of the left sacral ala extending into the S3 vertebral body with associated marrow edema and adjacent soft tissue edema. 2. Mild muscle edema in the left piriformis muscle likely reflecting reactive edema secondary to the adjacent sacral fracture versus mild muscle strain. Electronically Signed   By: Elige Ko M.D.   On: 08/15/2021 08:11   CT Lumbar Spine Wo Contrast  Result Date: 08/15/2021 CLINICAL DATA:  Low back pain, trauma, fall EXAM: CT LUMBAR SPINE WITHOUT CONTRAST TECHNIQUE: Multidetector CT imaging of the lumbar spine was performed without intravenous contrast administration. Multiplanar CT image reconstructions were also generated. RADIATION DOSE REDUCTION: This exam was performed according to the departmental dose-optimization program which includes automated exposure control,  adjustment of the mA and/or kV according to patient size and/or use of iterative reconstruction technique. COMPARISON:  None Available. FINDINGS: Evaluation is somewhat limited by degree of osteopenia segmentation: 5 lumbar type vertebrae. Alignment: Trace anterolisthesis of T11 on T12, in part secondary to the T12 compression fracture. Trace anterolisthesis of L3 on L4 and L4 on L5 which appears facet mediated. Vertebrae: No definite acute fracture. T12 and L1 chronic appearing compression fractures status post kyphoplasty. There is approximately 5 mm of retropulsion of the posterosuperior cortex of T12 and 4 mm retropulsion of the posterosuperior cortex of L1 L4-L5 posterior decompression and fixation with interbody disc spacer. Paraspinal and other soft tissues: Aortic atherosclerosis. Prior cholecystectomy. Diverticulosis without evidence diverticulitis. Disc levels: Mild spinal canal stenosis at T11-T12 and T12-L1 secondary to retropulsed fracture fragments. Moderate spinal canal stenosis at L3-L4 secondary to disc bulge, ligamentum flavum hypertrophy, and facet arthropathy. IMPRESSION: Evaluation is somewhat limited by degree of osteopenia. Within this limitation, no definite acute fracture or traumatic listhesis. Electronically Signed   By: Wiliam Ke M.D.   On: 08/15/2021 00:27   CT Hip Left Wo Contrast  Result Date: 08/15/2021 CLINICAL DATA:  Hip trauma, fracture suspected. EXAM: CT OF THE LEFT HIP WITHOUT CONTRAST TECHNIQUE: Multidetector CT imaging of the left hip was performed according to the standard protocol. Multiplanar CT image reconstructions were also generated. RADIATION DOSE REDUCTION: This exam was performed according to the departmental dose-optimization program which includes automated exposure control, adjustment of the mA and/or kV according to patient size and/or use of iterative reconstruction technique. COMPARISON:  08/14/2021. FINDINGS: Bones/Joint/Cartilage No acute fracture or  dislocation at the left hip. Mild degenerative changes are present at the left hip. There is a linear lucency involving the lateral aspect of the inferior sacrum on the left and cortical offset, suspicious for nondisplaced fracture. Degenerative changes are noted at the symphysis pubis and sacroiliac joints bilaterally. Ligaments Suboptimally assessed by CT. Muscles and Tendons There is mild edema and enlargement of the posterior aspect of the obturator internus and piriformis muscles on the left in the region of the presacral space with fat stranding. No significant hematoma is seen. Soft tissues No significant hematoma. Scattered diverticula are noted along the colon without evidence of diverticulitis. There is a left inguinal hernia containing nonobstructed bowel. A Foley catheter is present in the urinary bladder IMPRESSION: 1. No acute fracture at the left hip. 2. Linear lucency and cortical offset in the inferior aspect of the lateral  aspect of the sacrum on the left, possible nondisplaced fracture. MRI is suggested for further evaluation. 3. Soft tissue edema and enlargement of the piriformis and obturator internus muscles on the left. No intramuscular hematoma is identified. 4. Left inguinal hernia containing nonobstructed bowel. Electronically Signed   By: Thornell Sartorius M.D.   On: 08/15/2021 00:24   DG Hip Unilat W or Wo Pelvis 2-3 Views Left  Result Date: 08/14/2021 CLINICAL DATA:  Fall, left hip pain EXAM: DG HIP (WITH OR WITHOUT PELVIS) 2-3V LEFT COMPARISON:  None Available. FINDINGS: Normal alignment. No acute fracture or dislocation. Mild left hip degenerative arthritis. Lumbar fusion hardware partially visualized. Foley catheter overlies the expected bladder lumen. Soft tissues are otherwise unremarkable. IMPRESSION: No acute abnormality Electronically Signed   By: Helyn Numbers M.D.   On: 08/14/2021 21:06   CT Cervical Spine Wo Contrast  Result Date: 08/14/2021 CLINICAL DATA:  Acute neck  pain after a fall. EXAM: CT CERVICAL SPINE WITHOUT CONTRAST TECHNIQUE: Multidetector CT imaging of the cervical spine was performed without intravenous contrast. Multiplanar CT image reconstructions were also generated. RADIATION DOSE REDUCTION: This exam was performed according to the departmental dose-optimization program which includes automated exposure control, adjustment of the mA and/or kV according to patient size and/or use of iterative reconstruction technique. COMPARISON:  04/22/2021 FINDINGS: Alignment: Straightening of usual cervical lordosis without anterior subluxation. This may be due to patient positioning but could indicate muscle spasm. Normal alignment of the posterior elements. C1-2 articulation appears intact. Skull base and vertebrae: No acute fracture. No primary bone lesion or focal pathologic process. Soft tissues and spinal canal: No prevertebral fluid or swelling. No visible canal hematoma. Disc levels: Diffuse degenerative change with intervertebral disc space narrowing and endplate osteophyte formation throughout. Degenerative changes in the facet joints. Upper chest: Mild scarring and calcification in the lung apices, likely postinflammatory. Other: None. IMPRESSION: Nonspecific straightening of usual cervical lordosis. Diffuse degenerative change. No acute displaced fractures are identified. Electronically Signed   By: Burman Nieves M.D.   On: 08/14/2021 20:52   CT HEAD WO CONTRAST ( )  Result Date: 08/14/2021 CLINICAL DATA:  Head trauma due to a fall. No loss of consciousness. No blood thinner use. EXAM: CT HEAD WITHOUT CONTRAST TECHNIQUE: Contiguous axial images were obtained from the base of the skull through the vertex without intravenous contrast. RADIATION DOSE REDUCTION: This exam was performed according to the departmental dose-optimization program which includes automated exposure control, adjustment of the mA and/or kV according to patient size and/or use of  iterative reconstruction technique. COMPARISON:  04/22/2021 FINDINGS: Brain: Diffuse cerebral atrophy. Ventricular dilatation consistent with central atrophy. Low-attenuation changes in the deep white matter consistent with small vessel ischemia. No abnormal extra-axial fluid collections. No mass effect or midline shift. Gray-white matter junctions are distinct. Basal cisterns are not effaced. No acute intracranial hemorrhage. Vascular: Intracranial arterial vascular calcifications. Skull: Calvarium appears intact. Small subcutaneous scalp hematoma over the anterior frontal region. Sinuses/Orbits: Paranasal sinuses and mastoid air cells are clear. Other: None. IMPRESSION: No acute intracranial abnormalities. Chronic atrophy and small vessel ischemic changes. Electronically Signed   By: Burman Nieves M.D.   On: 08/14/2021 20:50    Labs:  Basic Metabolic Panel: Recent Labs  Lab 08/23/21 0541 08/26/21 1047  NA 139 141  K 3.9 4.3  CL 106 109  CO2 29 27  GLUCOSE 95 92  BUN 26* 24*  CREATININE 0.98 0.97  CALCIUM 9.3 9.8    CBC:    Latest  Ref Rng & Units 08/26/2021   10:47 AM 08/19/2021    6:04 AM 08/14/2021    9:53 PM  CBC  WBC 4.0 - 10.5 K/uL 5.0  3.4  7.5   Hemoglobin 12.0 - 15.0 g/dL 04.5  40.9  81.1   Hematocrit 36.0 - 46.0 % 38.4  39.2  41.3   Platelets 150 - 400 K/uL 315  263  226      CBG: No results for input(s): "GLUCAP" in the last 168 hours.  Brief HPI:   Marie Mitchell is a 88 y.o. female with history of osteoporosis, frequent falls, chronic foley who was admitted on 08/13/21 after reports of fall 05/22 followed by weakness and slurred speech with difficulty drinking water. MRI brain showed acute perforator infarct in left basal ganglia with slight edema.  CT left hip was negative for acute fracture and MR sacrum showed possible acute non-displaced left sacral fracture extending into S3 with associate marrow edema and soft tissue edema. Dr. Jena Gauss recommended WBAT and  outpatient follow up. Neurology recommended DAPT X 3 weeks followed by ASA alone. PT/OT has been working with patient and CIR recommended due to functional decline.    Hospital Course: Escarlet Saathoff was admitted to rehab 08/17/2021 for inpatient therapies to consist of PT, ST and OT at least three hours five days a week. Past admission physiatrist, therapy team and rehab RN have worked together to provide customized collaborative inpatient rehab. Her blood pressures/heart rate were monitored on TID basis and amlodipine was decreased to 2.5 mg. Constipation has resolved with addition of miralax. She continued to have issues with bladder spasms and foley has been replaced X 2--last on 06/05 with 14 french. Back/hip pain has been managed with prn use of tramadol.   Low dose trazodone was added to  help manage sleep insomnia with amantadine to help with activation/sleepiness during the day. Mild azotemia noted on routine BMET and has improved with IVF for hydration briefly. Continue to encourage fluid intake and monitor lytes with routine checks. Tramadol has been used on prn basis for leg and back pain.  She continues to require supervision to min assist overall and family has elected on SNF for further therapy She was [facility] on 08/29/21   Rehab course: During patient's stay in rehab weekly team conferences were held to monitor patient's progress, set goals and discuss barriers to discharge. At admission, patient required mod to max for mobility and with ADL tasks. She exhibited mild cognitive impairments with SLUMS score 19/26, mild dysarthria and occasional cough with meals. She  has had improvement in activity tolerance, balance, postural control as well as ability to compensate for deficits. She requires min assist to complete ADL tasks. She requires min assist for standing balance and supervision to ambulate 150' with RW. She requires intermittent supervision with extra time for safety  with meals. She  requires supervision to express wants and needs and is able to read at sentence/paragraph level with 80% accuracy.     Disposition: Home  Diet: Low fat, low cholesterol  Special Instructions: Foley care BID and after BM Follow up with Dr. Arita Miss for urology/foley issues and Neurology for post stroke follow up   Encourage po fluids. Monitor hydration status with routine BMET checks.  4.    Discontinue Plavix after 11 days.    Discharge Instructions     Ambulatory referral to Neurology   Complete by: As directed    An appointment is requested in approximately: 4-6  weeks      Allergies as of 08/29/2021       Reactions   Bacitracin    Not on MAR        Medication List     STOP taking these medications    amLODipine 5 MG tablet Commonly known as: NORVASC       TAKE these medications    acetaminophen 500 MG tablet Commonly known as: TYLENOL Take 500 mg by mouth every 6 (six) hours as needed (for pain).   amantadine 100 MG capsule Commonly known as: SYMMETREL Take 1 capsule (100 mg total) by mouth daily.   aspirin EC 81 MG tablet Take 1 tablet (81 mg total) by mouth daily. Swallow whole.   atorvastatin 40 MG tablet Commonly known as: LIPITOR Take 1 tablet (40 mg total) by mouth daily.   calcium carbonate 1500 (600 Ca) MG Tabs tablet Commonly known as: OSCAL Take 600-1,200 mg by mouth See admin instructions. Give 2 tablets by mouth in the morning then give 1 tablet every evening   calcium-vitamin D 500-5 MG-MCG tablet Commonly known as: OSCAL WITH D Take 2 tablets by mouth daily with breakfast AND 1 tablet at bedtime.   Cholecalciferol 25 MCG (1000 UT) tablet Take 1,000 Units by mouth at bedtime.   clopidogrel 75 MG tablet Commonly known as: PLAVIX Take 1 tablet (75 mg total) by mouth daily. For 11 more days What changed: additional instructions   FLUoxetine 20 MG tablet Commonly known as: PROZAC Take 1.5 tablets (30 mg total) by mouth  daily.   polyethylene glycol 17 g packet Commonly known as: MIRALAX / GLYCOLAX Take 17 g by mouth daily. What changed:  when to take this reasons to take this   senna-docusate 8.6-50 MG tablet Commonly known as: Senokot-S Take 2 tablets by mouth daily after supper.   sodium chloride 0.65 % Soln nasal spray Commonly known as: OCEAN Place 1 spray into both nostrils as needed for congestion.   traMADol 50 MG tablet--Rx# 6 pills Commonly known as: ULTRAM Take 1 tablet (50 mg total) by mouth 2 (two) times daily as needed for severe pain.   traZODone 50 MG tablet Commonly known as: DESYREL Take 0.5 tablets (25 mg total) by mouth at bedtime.   Turmeric 500 MG Tabs Take 500 mg by mouth 2 (two) times daily.   vitamin B-12 1000 MCG tablet Commonly known as: CYANOCOBALAMIN Take 1,000 mcg by mouth daily.        Contact information for follow-up providers     Haddix, Gillie Manners, MD. Call.   Specialty: Orthopedic Surgery Why: for follow up appointment Contact information: [address] [place] [address] [phone]         [facility] Follow up.   Why: office will call you with follow up appointment Contact information: [address]     [address] [place] [address] [phone]        Genice Rouge, MD Follow up.   Specialty: Physical Medicine and Rehabilitation Why: As needed Contact information: [address] [place] [address] [phone]         Noel Christmas, MD. Call.   Specialty: Urology Why: for routine follow up and GU issues Contact information: [address] [place] [address] [phone]              Contact information for after-discharge care     Destination     [facility] .   Service: Skilled Nursing Contact information: [address] [place] [address] [phone]                      Signed: Jacquelynn Cree 08/29/2021, 9:26 AM

## 2021-08-26 NOTE — [place] FL2 (Signed)
[place] MEDICAID FL2 LEVEL OF CARE SCREENING TOOL     IDENTIFICATION  Patient Name: Marie Mitchell Birthdate: [birth year] Sex: female Admission Date (Current Location): 08/17/2021  [facility] and [place] Number:  Producer, television/film/video and Address:  The . [facility], [address] [place], [place] [address]      Provider Number: [id number]  Attending Physician Name and Address:  Genice Rouge, MD  Relative Name and Phone Number:  Carlynn Spry (daughter) #[phone]    Current Level of Care: SNF Recommended Level of Care: Skilled Nursing Facility Prior Approval Number:    Date Approved/Denied:   PASRR Number: [id number]  Discharge Plan: SNF    Current Diagnoses: Patient Active Problem List   Diagnosis Date Noted   Left basal ganglia embolic stroke (HCC) 08/17/2021   Acute CVA (cerebrovascular accident) (HCC) 08/15/2021   Falls frequently 08/15/2021   Sacral fracture (HCC) 08/15/2021   HTN (hypertension) 08/15/2021   Anxiety 08/15/2021   Osteoporosis 08/15/2021    Orientation RESPIRATION BLADDER Height & Weight     Self, Time, Situation, Place  Normal Indwelling catheter Weight: 134 lb 0.6 oz (60.8 kg) Height:  5\' 7"  (170.2 cm)  BEHAVIORAL SYMPTOMS/MOOD NEUROLOGICAL BOWEL NUTRITION STATUS      Incontinent Diet (regular diet; thin  liquids)  AMBULATORY STATUS COMMUNICATION OF NEEDS Skin   Limited Assist Verbally Normal                       Personal Care Assistance Level of Assistance  Bathing, Dressing Bathing Assistance: Limited assistance   Dressing Assistance: Limited assistance     Functional Limitations Info  Sight, Hearing, Speech Sight Info: Adequate Hearing Info: Impaired Speech Info: Adequate    SPECIAL CARE FACTORS FREQUENCY  PT (By licensed PT), OT (By licensed OT), Speech therapy     PT Frequency: 5xs per week OT Frequency: 5xs per week     Speech Therapy Frequency: 5xs per week      Contractures  Contractures Info: Not present    Additional Factors Info  Allergies, Code Status Code Status Info: Full Allergies Info: Bacitracin           Current Medications (08/26/2021):  This is the current hospital active medication list Current Facility-Administered Medications  Medication Dose Route Frequency Provider Last Rate Last Admin   acetaminophen (TYLENOL) tablet 650 mg  650 mg Oral Q4H PRN Lovorn, Megan, MD   650 mg at 08/20/21 1210   Or   acetaminophen (TYLENOL) 160 MG/5ML solution 650 mg  650 mg Per Tube Q4H PRN Lovorn, Megan, MD       Or   acetaminophen (TYLENOL) suppository 650 mg  650 mg Rectal Q4H PRN Lovorn, Megan, MD       acetaminophen (TYLENOL) tablet 325 mg  325 mg Oral Q6H 08/22/21, RPH   325 mg at 08/26/21 1204   alum & mag hydroxide-simeth (MAALOX/MYLANTA) 200-200-20 MG/5ML suspension 15 mL  15 mL Oral Q4H PRN Love, Pamela S, PA-C       amantadine (SYMMETREL) capsule 100 mg  100 mg Oral Daily Lovorn, Megan, MD   100 mg at 08/26/21 1229   amLODipine (NORVASC) tablet 2.5 mg  2.5 mg Oral Daily Raulkar, 10/26/21, MD       aspirin EC tablet 81 mg  81 mg Oral Daily Lovorn, Megan, MD   81 mg at 08/26/21 0813   atorvastatin (LIPITOR) tablet 40 mg  40 mg Oral Daily Lovorn,  Aundra Millet, MD   40 mg at 08/26/21 0813   bisacodyl (DULCOLAX) suppository 10 mg  10 mg Rectal Daily PRN Lovorn, Aundra Millet, MD       calcium-vitamin D (OSCAL WITH D) 500-5 MG-MCG per tablet 1 tablet  1 tablet Oral QHS Lovorn, Megan, MD   1 tablet at 08/25/21 2053   calcium-vitamin D (OSCAL WITH D) 500-5 MG-MCG per tablet 2 tablet  2 tablet Oral Q breakfast Lovorn, Megan, MD   2 tablet at 08/26/21 0813   Chlorhexidine Gluconate Cloth 2 % PADS 6 each  6 each Topical BID Raulkar, Drema Pry, MD   6 each at 08/26/21 0524   cholecalciferol (VITAMIN D) tablet 1,000 Units  1,000 Units Oral QHS Lovorn, Aundra Millet, MD   1,000 Units at 08/25/21 2051   clopidogrel (PLAVIX) tablet 75 mg  75 mg Oral Daily Lovorn, Megan, MD   75 mg  at 08/26/21 0813   enoxaparin (LOVENOX) injection 40 mg  40 mg Subcutaneous Q24H Lovorn, Megan, MD   40 mg at 08/26/21 1204   FLUoxetine (PROZAC) capsule 30 mg  30 mg Oral Daily Lovorn, Megan, MD   30 mg at 08/26/21 0813   guaiFENesin (ROBITUSSIN) 100 MG/5ML liquid 10 mL  10 mL Oral Q4H PRN Love, Pamela S, PA-C       polyethylene glycol (MIRALAX / GLYCOLAX) packet 17 g  17 g Oral Daily Lovorn, Megan, MD   17 g at 08/26/21 0813   prochlorperazine (COMPAZINE) tablet 5 mg  5 mg Oral Q6H PRN Love, Pamela S, PA-C       Or   prochlorperazine (COMPAZINE) suppository 25 mg  25 mg Rectal Q12H PRN Love, Pamela S, PA-C       Or   prochlorperazine (COMPAZINE) injection 10 mg  10 mg Intravenous Q12H PRN Love, Pamela S, PA-C       senna-docusate (Senokot-S) tablet 2 tablet  2 tablet Oral 1 day or 1 dose Love, Pamela S, PA-C   2 tablet at 08/26/21 1229   sodium chloride (OCEAN) 0.65 % nasal spray 1 spray  1 spray Each Nare PRN Tressia Miners, FNP   1 spray at 08/24/21 2232   sodium phosphate (FLEET) 7-19 GM/118ML enema 1 enema  1 enema Rectal Daily PRN Love, Pamela S, PA-C       traMADol (ULTRAM) tablet 50 mg  50 mg Oral Q6H PRN Lovorn, Aundra Millet, MD   50 mg at 08/23/21 2047   traZODone (DESYREL) tablet 25 mg  25 mg Oral QHS Lovorn, Aundra Millet, MD   25 mg at 08/25/21 2051     Discharge Medications: Please see discharge summary for a list of discharge medications.  Relevant Imaging Results:  Relevant Lab Results:   Additional Information QQ#[id number]  Gretchen Short, LCSW

## 2021-08-26 NOTE — Plan of Care (Signed)
  Problem: RH Balance Goal: LTG Patient will maintain dynamic sitting balance (PT) Description: LTG:  Patient will maintain dynamic sitting balance with assistance during mobility activities (PT) Flowsheets (Taken 08/26/2021 0804) LTG: Pt will maintain dynamic sitting balance during mobility activities with:: (downgrade due to decreased safety) Supervision/Verbal cueing Note: downgrade due to decreased safety Goal: LTG Patient will maintain dynamic standing balance (PT) Description: LTG:  Patient will maintain dynamic standing balance with assistance during mobility activities (PT) Flowsheets (Taken 08/26/2021 0804) LTG: Pt will maintain dynamic standing balance during mobility activities with:: (downgrade due to decreased safety) Supervision/Verbal cueing Note: downgrade due to decreased safety   Problem: RH Bed to Chair Transfers Goal: LTG Patient will perform bed/chair transfers w/assist (PT) Description: LTG: Patient will perform bed to chair transfers with assistance (PT). Flowsheets (Taken 08/26/2021 0804) LTG: Pt will perform Bed to Chair Transfers with assistance level: (downgrade due to decreased safety) Supervision/Verbal cueing Note: downgrade due to decreased safety   Problem: RH Ambulation Goal: LTG Patient will ambulate in controlled environment (PT) Description: LTG: Patient will ambulate in a controlled environment, # of feet with assistance (PT). Flowsheets (Taken 08/26/2021 0804) LTG: Pt will ambulate in controlled environ  assist needed:: (downgrade due to slow progress) -- LTG: Ambulation distance in controlled environment: 150 ft with LRAD Note: Downgrade due to slow progress

## 2021-08-27 DIAGNOSIS — R339 Retention of urine, unspecified: Secondary | ICD-10-CM

## 2021-08-27 DIAGNOSIS — Z978 Presence of other specified devices: Secondary | ICD-10-CM

## 2021-08-27 MED ORDER — SENNOSIDES-DOCUSATE SODIUM 8.6-50 MG PO TABS: 2.0000 | ORAL_TABLET | Freq: Every day | ORAL | Status: DC

## 2021-08-27 MED ORDER — CLOPIDOGREL BISULFATE 75 MG PO TABS: 75.0000 mg | ORAL_TABLET | Freq: Every day | ORAL | 0 refills | Status: DC

## 2021-08-27 MED ORDER — TRAMADOL HCL 50 MG PO TABS: 50.0000 mg | ORAL_TABLET | Freq: Two times a day (BID) | ORAL | 0 refills | Status: AC | PRN

## 2021-08-27 MED ORDER — POLYETHYLENE GLYCOL 3350 17 G PO PACK: 17.0000 g | PACK | Freq: Every day | ORAL | 0 refills | Status: AC

## 2021-08-27 MED ORDER — OYSTER SHELL CALCIUM/D3 500-5 MG-MCG PO TABS
ORAL_TABLET | ORAL | Status: DC
Start: 2021-08-27 — End: 2021-10-10

## 2021-08-27 MED ORDER — SALINE SPRAY 0.65 % NA SOLN: 1.0000 | NASAL | 0 refills | Status: AC | PRN

## 2021-08-27 MED ORDER — FLUOXETINE HCL 20 MG PO TABS
30.0000 mg | ORAL_TABLET | Freq: Every day | ORAL | 3 refills | Status: DC
Start: 2021-08-27 — End: 2023-10-25

## 2021-08-27 MED ORDER — TRAZODONE HCL 50 MG PO TABS
25.0000 mg | ORAL_TABLET | Freq: Every day | ORAL | Status: DC
Start: 2021-08-27 — End: 2023-10-25

## 2021-08-27 MED ORDER — AMANTADINE HCL 100 MG PO CAPS: 100.0000 mg | ORAL_CAPSULE | Freq: Every day | ORAL | Status: DC

## 2021-08-27 NOTE — Progress Notes (Signed)
Patient ID: Marie Mitchell, female   DOB: July 20, 1933, 88 y.o.   MRN: [id number]  SW met with pt dtr Lelon Frohlich to provide discuss transition. Reports that she intends to sign paperwork for admission to [facility] tomorrow. Was also under the impression that her mother would remain here through Friday, and states her husband does not return from working out town until Friday and she was told she was have to drive her mother to the facility. SW discussed pt can transition via ambulance,and SW will confirm with therapy team this would be most appropriate.   Loralee Pacas, MSW, [place] Office: [phone] Cell: [phone] Fax: [phone]

## 2021-08-27 NOTE — Progress Notes (Signed)
Physical Therapy Session Note  Patient Details  Name: Rechy Bost MRN: [id number] Date of Birth: [birth year]  Today's Date: 08/27/2021 PT Individual Time: 1130-1200; 1415-1530 PT Individual Time Calculation (min): 30 min and 75 min  Short Term Goals: Week 2:  PT Short Term Goal 1 (Week 2): =LTG due to ELOS  Skilled Therapeutic Interventions/Progress Updates:    Session 1: Pt received seated in w/c in room, agreeable to PT session. Pt reports she is having more pain in her LLE this date, however when asked to rate pain reports she is not having any pain. Sit to stand with min A to RW during session with cues for safe UE placement and anterior weight shift with transfer. Ambulation 2 x 150 ft with RW and CGA for balance, significant path deviation to the L and inattention to obstacles in L visual field noted. Sit to stand 2 x 5 reps from progressively lower mat table with RW, initially with min A progressing to CGA with focus on anterior weight shift with transfer. Pt left seated in w/c in room with needs in reach, quick release belt and chair alarm in place at end of session.  Session 2: Pt received seated in w/c in room, agreeable to PT session. No complaints of pain. Sit to stand with min A to RW during session with ongoing posterior bias, no carryover of anterior weight shift and "nose over toes" worked on during AM session. Ambulation up to 200 ft during session with RW and CGA, again with path deviation to the L and inattention to obstacles in L visual field and catching of RW on obstacles on L side. Ambulation through obstacle course weaving through cones with RW and with mod v/c for obstacle avoidance and safe RW management. Pt appears to have increased difficulty with safe cone navigation this date. Cone retrieval around gym with RW and CGA for balance with focus on scanning L-side for targets. Pt again requires cues for safe RW management and for obstacle avoidance on L side. Standing  balance reaching outside BOS and across midline for clothespins and to place clothespins on basketball net on L side, min A for balance with cues to lean outside BOS. Pt returned to bed at end of session, mod A needed for BLE management. Pt left seated in bed with needs in reach, bed alarm in place.  Therapy Documentation Precautions:  Precautions Precautions: Fall Restrictions Weight Bearing Restrictions: No LUE Weight Bearing: Weight bearing as tolerated LLE Weight Bearing: Weight bearing as tolerated       Therapy/Group: Individual Therapy   Peter Congo, PT, DPT, CSRS 08/27/2021, 3:37 PM

## 2021-08-27 NOTE — Patient Care Conference (Signed)
Inpatient RehabilitationTeam Conference and Plan of Care Update Date: 08/27/2021   Time: 11:09 AM    Patient Name: Marie Mitchell      Medical Record Number: [id number]  Date of Birth: February 09, 1934 Sex: Female         Room/Bed: 4W18C/4W18C-01 Payor Info: Payor: MEDICARE / Plan: MEDICARE PART A AND B / Product Type: *No Product type* /    Admit Date/Time:  08/17/2021  3:03 PM  Primary Diagnosis:  Left basal ganglia embolic stroke [facility])  Hospital Problems: Principal Problem:   Left basal ganglia embolic stroke [facility]) Active Problems:   Falls frequently   HTN (hypertension)   Anxiety   Urinary retention   Foley catheter in place    Expected Discharge Date: Expected Discharge Date: 08/31/21  Team Members Present: Physician leading conference: Dr. Genice Rouge Social Worker Present: [name], LCSWA Nurse Present: Kennyth Arnold, RN PT Present: Peter Congo, PT OT Present: Roney Mans, OT PPS Coordinator present : Edson Snowball, PT     Current Status/Progress Goal Weekly Team Focus  Bowel/Bladder   Continent of bowel. LBM 08/26/21. Chronic Foley.  Remain continent  Assess Q shift and prn   Swallow/Nutrition/ Hydration   regular textures and thin liquids, no further coughing on thin liquids noted  Supervision A  education and swallow strategies   ADL's   bathing-min A; LB dressing with mod A: uB dressing with mod A; standing balance-CGA  supervision overall, LB dressing and bathing with min A; toileting-supervision  activity tolerance, standing balance, safety awareness, pt to d/c to SNF   Mobility   Supervision bed mobility, min A transfers with RW, gait up to 150 ft RW CGA  downgraded to Supervision overall  family edu, d/c planning, sit to stand   Communication   Min-Supervision A, mod A for abstract word finding  sup A intelligibility and daily word finding  education, speech and language strategies   Safety/Cognition/ Behavioral Observations             Pain   Denies Pain  <3/10.  Assess Q shift and prn   Skin   Bruising BUE  No new skin breakdown  Assess Q shift and prn.     Discharge Planning:  Pt currently lives at [facility]. Pt dtr only able to provide intermittent Asst. Fam edu completed on 6/5 9am-12pm with her dtr for the family to determine best level of care needed at discharge. Decision to d/c to [facility] LTC unit.   Team Discussion: Added Amantadine. Awake today, confused. Complains of leg pain. Push fluids. Foley changed 08/26/21, continent bowel. Accepted at [facility], will continue therapy.  Patient on target to meet rehab goals: yes, supervision goals.   *See Care Plan and progress notes for long and short-term goals.   Revisions to Treatment Plan:  Finalizing discharge plans   Teaching Needs: Family education, medication/pain management, fole care, safety awareness, transfer/gait training, etc.   Current Barriers to Discharge: No current barriers  Possible Resolutions to Barriers: All barriers addressed     Medical Summary Current Status: foley changed yesterday due to leakage- rare pain- tramadol  Barriers to Discharge: Decreased family/caregiver support;Home enviroment access/layout;Medical stability;Neurogenic Bowel & Bladder  Barriers to Discharge Comments: need SNF polacement- since at min A- her ALF would need her to do SNF prior Possible Resolutions to Barriers/Weekly Focus: ready for SNF- 6/9 until knows SNF bed   Continued Need for Acute Rehabilitation Level of Care: The patient requires daily medical management by a physician  with specialized training in physical medicine and rehabilitation for the following reasons: Direction of a multidisciplinary physical rehabilitation program to maximize functional independence : Yes Medical management of patient stability for increased activity during participation in an intensive rehabilitation regime.: Yes Analysis of laboratory values and/or  radiology reports with any subsequent need for medication adjustment and/or medical intervention. : Yes   I attest that I was present, lead the team conference, and concur with the assessment and plan of the team.   Tennis Must 08/27/2021, 4:24 PM

## 2021-08-27 NOTE — Progress Notes (Signed)
Spoke with pts daughter today, she had concerns regarding her moms anxiety at night. Would like to see if needed klonopin 1/2 dose of 0.5mg  or Risperdal 0.25mg  could possibily be added to medication to help with pt anxiety.  Rayburn Ma

## 2021-08-27 NOTE — Progress Notes (Signed)
Occupational Therapy Session Note  Patient Details  Name: Marie Mitchell MRN: [id number] Date of Birth: [birth year]  Today's Date: 08/27/2021 OT Individual Time: 0900-1005 OT Individual Time Calculation (min): 65 min    Short Term Goals: Week 2:  OT Short Term Goal 1 (Week 2): STG=LTG 2/2 ELOS  Skilled Therapeutic Interventions/Progress Updates:    Pt resting in bed upon arrival. Pain as noted below and pt agreeable to getting OOB. Pt requested to use toilet and amb with RW to bathroom with CGA. Toileting with max A. Bathing/dressing with sit<>stand from w/c at sink. Bathing with min A. LB dressing with mod A. UB dressing with min A. Pt requires more then a reasonable amount of time to complete all tasks with delay noted between transitions. Pt remained in w/c with all needs within reach. Belt alarm activated.   Therapy Documentation Precautions:  Precautions Precautions: Fall Restrictions Weight Bearing Restrictions: No LUE Weight Bearing: Weight bearing as tolerated LLE Weight Bearing: Weight bearing as tolerated  Pain: Pt reports her legs were bothering her during the night; she received Tramadol earlier and they are feeling better now   Therapy/Group: Individual Therapy  Rich Brave 08/27/2021, 10:11 AM

## 2021-08-27 NOTE — Progress Notes (Signed)
PROGRESS NOTE   Subjective/Complaints:  Pt reports legs aching- first time she's described much pain to me- tylenol hasn't helped so far- will ask nursing to give Tramadol.   LBM yesterday Asked if I was the nurse- didn't know I was doctor who sees her daily.  Ate 100% tray.   ROS:  Pt denies SOB, abd pain, CP, N/V/C/D, and vision changes  Objective:   No results found. Recent Labs    08/26/21 1047  WBC 5.0  HGB 12.4  HCT 38.4  PLT 315    Recent Labs    08/26/21 1047  NA 141  K 4.3  CL 109  CO2 27  GLUCOSE 92  BUN 24*  CREATININE 0.97  CALCIUM 9.8     Intake/Output Summary (Last 24 hours) at 08/27/2021 0838 Last data filed at 08/26/2021 1817 Gross per 24 hour  Intake 440 ml  Output --  Net 440 ml        Physical Exam: Vital Signs Blood pressure 114/67, pulse (!) 57, temperature 98.4 F (36.9 C), resp. rate 14, height 5\' 7"  (1.702 m), weight 60.8 kg, SpO2 97 %.     General: awake, alert, appropriate, sitting up in bed; finished 100% tray;  NAD HENT: conjugate gaze; oropharynx moist CV: regular rate; no JVD Pulmonary: CTA B/L; no W/R/R- good air movement GI: soft, NT, ND, (+)BS Psychiatric: appropriate/more awake- more interactive- Neurological: poor memory- doesn't remember my role- thought I was nurse  Genitourinary:    Comments: Has foley in place- medium amber urine in bag- no leakage this AM- same Musculoskeletal:     Cervical back: Neck supple. No tenderness.     Comments: RUE- deltoid, biceps, triceps 4+/5; otherwise 5-/5 LUE 5/5  RLE- HF 4+/5 and R PF 4+/5; otherwise 5-/5 LLE- 5-/5 throughout  Skin:    General: Skin is warm and dry.     Comments: L forearm IV- a little blood flash in IV- looks OK otherwise  Neurological:     Mental Status: She is alert.     Comments: Limited historian Intact to light touch in all 4 extremities and face Ox2  Assessment/Plan: 1. Functional  deficits which require 3+ hours per day of interdisciplinary therapy in a comprehensive inpatient rehab setting. Physiatrist is providing close team supervision and 24 hour management of active medical problems listed below. Physiatrist and rehab team continue to assess barriers to discharge/monitor patient progress toward functional and medical goals  Care Tool:  Bathing    Body parts bathed by patient: Right arm, Left arm, Chest, Abdomen, Front perineal area, Buttocks, Right upper leg, Left upper leg, Face   Body parts bathed by helper: Right lower leg, Left lower leg     Bathing assist Assist Level: Minimal Assistance - Patient > 75%     Upper Body Dressing/Undressing Upper body dressing   What is the patient wearing?: Pull over shirt    Upper body assist Assist Level: Moderate Assistance - Patient 50 - 74%    Lower Body Dressing/Undressing Lower body dressing      What is the patient wearing?: Pants, Underwear/pull up     Lower body assist Assist for lower body  dressing: Moderate Assistance - Patient 50 - 74%     Toileting Toileting Toileting Activity did not occur Landscape architect and hygiene only):  (for foley care)  Toileting assist Assist for toileting: Dependent - Patient 0%     Transfers Chair/bed transfer  Transfers assist     Chair/bed transfer assist level: Minimal Assistance - Patient > 75% Chair/bed transfer assistive device: Programmer, multimedia   Ambulation assist      Assist level: Minimal Assistance - Patient > 75% Assistive device: Walker-rolling Max distance: 150'   Walk 10 feet activity   Assist  Walk 10 feet activity did not occur: Safety/medical concerns  Assist level: Minimal Assistance - Patient > 75% Assistive device: Walker-rolling   Walk 50 feet activity   Assist Walk 50 feet with 2 turns activity did not occur: Safety/medical concerns  Assist level: Minimal Assistance - Patient > 75% Assistive  device: Walker-rolling    Walk 150 feet activity   Assist Walk 150 feet activity did not occur: Safety/medical concerns  Assist level: Minimal Assistance - Patient > 75% Assistive device: Walker-rolling    Walk 10 feet on uneven surface  activity   Assist Walk 10 feet on uneven surfaces activity did not occur: Safety/medical concerns         Wheelchair     Assist Is the patient using a wheelchair?: Yes Type of Wheelchair: Manual    Wheelchair assist level: Total Assistance - Patient < 25% Max wheelchair distance: >150 ft    Wheelchair 50 feet with 2 turns activity    Assist        Assist Level: Total Assistance - Patient < 25%   Wheelchair 150 feet activity     Assist      Assist Level: Total Assistance - Patient < 25%   Blood pressure 114/67, pulse (!) 57, temperature 98.4 F (36.9 C), resp. rate 14, height 5\' 7"  (1.702 m), weight 60.8 kg, SpO2 97 %.  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 with R hemiparesis ELOS 16-20 days min A Patient able to shower. D/c set for 6/9-  Con't CIR- PT, OT and SLP- team conference today to finalize d/c.  2. DVT Prophylaxis/Anticoagulation: Pharmaceutical: Lovenox             On ASA and Plavix- Plavix for 21 days 2. Pain Management: Continue scheduled tylenol 325 mg q6 hours and con't prn as well- might need tramadol, however makes her constipated  5/28- will add tramadol per pt request prn- 50 mg q6 hours prn  6/1- still using just tylenol scheduled- will ask therapy if pain is affecting therapy.   6/6- leg pain this Am- is new for her to sk, so asked staff to give 1 dose tramadol.  3. Mood: Hx of anxiety > depression- will continue Prozac that she's been on for 20 years- might need to increase it and might need to see Neuropsychology.  4. Patient is not able to make decisions for herself due to stroke and per family, recent reduction  in mentation.  5. Hypotension/bradycardia: decrease Norvasc to 2.5mg  6. HLD- will continue statin that was started per Neurology 7. L sacral ala fracture- is WBAT on B/L LE's, however will treat pain, since tx is conservative. Might require Tramadol.  8. Constipation- is chronic for pt- will schedule Miralax QOD for now; however if add tramadol, will make daily.   6/1- LBM yesterday afternoon- not clear if took sorbitol or  just went.   6/6- LBM yesterday- con't regimen with miralax.  9. Chronic foley due to bladder spasms- just changed on day of admission- pt accidentally pulled it out- will try to use leg bag as much as possible.   6/5- had to be changed yesterday due to leakage? 10.  Recent kyphoplasty 07/08/21- T8/T12/L1 for compression fractures.  11. Osteoporosis/Vit D Deficiency- continue Vit D and monitor for falls- since has hx of frequent falls.  12. Sundowning at home- will monitor for behavior- might need something for sundowning.  13. Insomnia  5/28- will add Trazodone 25 mg QHS for sleep- might need more, but since 86, will start low.   5/29 Reports sleep improved, monitor  5/30- didn't sleep as well- will increase trazodone to 50 mg QHS  5/31- per pt request, will change back to 25 mg QHS.   Sleeping much better per pt- continue regimen 14.Mild Azotemia  -overall stable, advised to drink fluids  6/1- BUN 26 and Cr 1.02- will recheck again in Am and if getting worse, will give IVFs.   6/2- BUN still 26- will recheck Monday and give IVFs if needed.   6/6- BUN 24- Cr 0.97- will con't to monitor, and push fluids 15. Sleepiness during day  6/5- will try Amantadine 100 mg daily.   6/6- much more awake this AM- con't Amantadine for now   I spent a total of 37   minutes on total care today- >50% coordination of care- due to team conference and d/w nursing about pain meds    LOS: 10 days A FACE TO FACE EVALUATION WAS PERFORMED  Brayon Bielefeld 08/27/2021, 8:38 AM

## 2021-08-27 NOTE — Progress Notes (Signed)
Speech Language Pathology Daily Session Note  Patient Details  Name: Marie Mitchell MRN: [id number] Date of Birth: Apr 05, 1933  Today's Date: 08/27/2021 SLP Individual Time: 1201-1230 SLP Individual Time Calculation (min): 29 min  Short Term Goals: Week 2: SLP Short Term Goal 1 (Week 2): STG=LTG due to ELOS (6/9)  Skilled Therapeutic Interventions:Skilled ST services focused on swallow and speech skills. SLP facilitated PO consumption of regular textures and thin liquids via straw during lunch tray. Pt demonstrated ability to self-fed and required extra time for mastication, otherwise swallow appeared functional. No overt s/s aspiration. In a quiet environment, pt demonstrated 85% intelligibility with supervision A verbal cues for rate and vocal intensity. Pt expressed concerns with acute witting and reading deficits, SLP will further investigate in upcoming session due to time restraints. Pt was left in room with call bell within reach and chair alarm set. SLP recommends to continue skilled services.     Pain Pain Assessment Pain Score: 0-No pain  Therapy/Group: Individual Therapy  Saivon Prowse  [facility] 08/27/2021, 1:55 PM

## 2021-08-28 NOTE — Plan of Care (Signed)
  Problem: Sit to Stand Goal: LTG:  Patient will perform sit to stand in prep for activites of daily living with assistance level (OT) Description: LTG:  Patient will perform sit to stand in prep for activites of daily living with assistance level (OT) Outcome: Not Met (add Reason) Note: Min Assist required for sit to stand   Problem: RH Dressing Goal: LTG Patient will perform upper body dressing (OT) Description: LTG Patient will perform upper body dressing with assist, with/without cues (OT). Outcome: Not Met (add Reason) Note: Requires  min assistance with bra donning. Goal: LTG Patient will perform lower body dressing w/assist (OT) Description: LTG: Patient will perform lower body dressing with assist, with/without cues in positioning using equipment (OT) Outcome: Not Met (add Reason) Note: Mod Assist required.   Problem: RH Toileting Goal: LTG Patient will perform toileting task (3/3 steps) with assistance level (OT) Description: LTG: Patient will perform toileting task (3/3 steps) with assistance level (OT)  Outcome: Not Met (add Reason) Note: Min Assist required   Problem: RH Toilet Transfers Goal: LTG Patient will perform toilet transfers w/assist (OT) Description: LTG: Patient will perform toilet transfers with assist, with/without cues using equipment (OT) Outcome: Not Met (add Reason) Note: Pt requires Min Assist    Problem: RH Tub/Shower Transfers Goal: LTG Patient will perform tub/shower transfers w/assist (OT) Description: LTG: Patient will perform tub/shower transfers with assist, with/without cues using equipment (OT) Outcome: Not Met (add Reason) Note: Min Assist required.

## 2021-08-28 NOTE — Progress Notes (Signed)
Physical Therapy Session Note  Patient Details  Name: Marie Mitchell MRN: [id number] Date of Birth: [birth year]  Today's Date: 08/28/2021 PT Individual Time: 1300-1345 PT Individual Time Calculation (min): 45 min   Short Term Goals: Week 2:  PT Short Term Goal 1 (Week 2): =LTG due to ELOS  Skilled Therapeutic Interventions/Progress Updates:    Pt received seated in w/c in room, agreeable to PT session. No complaints of pain. Sit to stand with min A to RW during session due to ongoing posterior lean, cues needed for anterior weight shift and safe hand placement with transfer. Ambulation x 150 ft with RW at Supervision level, path deviation to the L with use of visual and verbal cues to maintain midline, decreased gait speed and flexed head/trunk posture. Car transfer with RW and Supervision. Pt able to retrieve one clothespin from the floor with use of reacher and max v/c for sequencing and safety with RW and min A for balance. Pt left seated in w/c in room with needs in reach, quick release belt in place at end of session.  Therapy Documentation Precautions:  Precautions Precautions: Fall Restrictions Weight Bearing Restrictions: Yes LUE Weight Bearing: Weight bearing as tolerated LLE Weight Bearing: Weight bearing as tolerated      Therapy/Group: Individual Therapy   Peter Congo, PT, DPT, CSRS 08/28/2021, 2:44 PM

## 2021-08-28 NOTE — Progress Notes (Signed)
Occupational Therapy Discharge Summary  Patient Details  Name: Marie Mitchell MRN: [id number] Date of Birth: 19-Mar-1934  Today's Date: 08/28/2021 OT Individual Time: 915AM - 1030AM   75 minutes total   Patient has met 5 of 11 long term goals due to improved activity tolerance, improved balance, functional use of  RIGHT upper extremity, and improved coordination.  Patient to discharge at [facility] Assist level.  Patient will receive assistance from staff at [facility] as needed.  Reasons goals not met: For safety, patient continues to require [place] for functional transfers due to slight posterior lean. Patient frequently uses back of legs against bed/w/c/chair when performing sit to stands or standing to sit transitions. Unable to manage bra donning at baseline.  Recommendation:  Patient will benefit from ongoing skilled OT services in  [place]  to continue to advance functional skills in the area of BADL and Reduce care partner burden.  Equipment: N/A  Reasons for discharge: treatment goals met and discharge from hospital  Patient/family agrees with progress made and goals achieved: Yes  Skilled Intervention: Patient in bed upon therapy arrival and agreeable to take a shower this AM.  Participated in skilled OT ADL re-training focusing on increasing patient's functional performance during morning self care. Patient required VC for form and technique prior to sit to stand  transition with RW. Initially, presenting with a posterior lean and forward posture, flexed at the hips. Tactile and VC provided for correct standing posture. Increased cueing was provided to navigate RW from bed to walk-in shower with intermittent physical assist to manage RW once approaching shower. Patient completed bathing while seated while therapist encouraged her to remain on task and provided cueing to wash all needed body parts. Pt was able to hold onto grab bar with both hands while standing with good  upright posture. Once finished bathing, patient ambulated from shower to w/c and completed dressing and grooming at sink level. Assist was provided to donn bra and complete LB dressing and footwear. Patient required intermittent VC overall to either remain on task and/or to complete appropriate sequencing.   Plan to discharge tomorrow from rehab unit.    OT Discharge Precautions/Restrictions  Precautions Precautions: Fall Restrictions Weight Bearing Restrictions: No  Pain Pain Assessment Pain Scale: 0-10 Pain Score: 0-No pain ADL ADL Eating: Supervision/safety Where Assessed-Eating: Wheelchair Grooming: Supervision/safety Where Assessed-Grooming: Sitting at sink Upper Body Bathing: Modified Independent Where Assessed-Upper Body Bathing: Shower Lower Body Bathing: Minimal assistance Where Assessed-Lower Body Bathing: Shower Upper Body Dressing: Moderate assistance Where Assessed-Upper Body Dressing: Wheelchair Lower Body Dressing: Dependent Where Assessed-Lower Body Dressing: Wheelchair Toileting: Maximal assistance Where Assessed-Toileting: Glass blower/designer: Psychiatric nurse Method: Stand pivot Tub/Shower Transfer: Unable to assess Tub/Shower Transfer Method: Unable to assess [employer] Transfer: Minimal assistance, Minimal cueing Social research officer, government Method: Heritage manager: Grab bars, Shower seat without back Vision Baseline Vision/History: 4 Cataracts;1 Wears glasses Patient Visual Report: No change from baseline Vision Assessment?: No apparent visual deficits Perception  Perception: Within Functional Limits Praxis Praxis: Intact Cognition Cognition Overall Cognitive Status: History of cognitive impairments - at baseline Arousal/Alertness: Awake/alert Orientation Level: Person;Place;Situation Sensation Sensation Light Touch: Appears Intact Hot/Cold: Appears Intact Proprioception: Appears  Intact Coordination Gross Motor Movements are Fluid and Coordinated: No Fine Motor Movements are Fluid and Coordinated: No Motor  Motor Motor: Abnormal postural alignment and control Motor - Skilled Clinical Observations: Slight impaired midline orientation and motor planning Mobility  Bed Mobility Bed Mobility: Supine to Sit  Supine to Sit: Supervision/Verbal cueing Sitting - Scoot to Edge of Bed: Supervision/Verbal cueing Transfers Sit to Stand: Minimal Assistance - Patient > 75%  Balance Balance Balance Assessed: Yes Berg Balance Test Standing Unsupported: Able to stand 2 minutes with supervision Sitting with Back Unsupported but Feet Supported on Floor or Stool: Able to sit safely and securely 2 minutes Stand to Sit: Uses backs of legs against chair to control descent Transfers: Needs one person to assist Extremity/Trunk Assessment RUE Assessment RUE Assessment: Within Functional Limits LUE Assessment LUE Assessment: Within Functional Limits  BIMS completed during PT tx session in afternoon with the following results:  08/28/21 1222  Cognition  Overall Cognitive Status History of cognitive impairments - at baseline  Arousal/Alertness Awake/alert  Orientation Level Person;Place;Situation  Brief Interview for Mental Status (BIMS)  Repetition of Three Words (First Attempt) 3  Temporal Orientation: Year Correct  Temporal Orientation: Month Missed by more than 1 month  Temporal Orientation: Day Correct  Recall: "Sock" Yes, no cue required  Recall: "Blue" Yes, no cue required  Recall: "Bed" Yes, no cue required  BIMS Summary Score 13    [employer], OTR/L,CBIS  Supplemental OT - [facility] and [facility]  08/28/2021, 12:25 PM

## 2021-08-28 NOTE — Progress Notes (Signed)
PROGRESS NOTE   Subjective/Complaints:  Pt has no issues   ROS:  Pt denies SOB, abd pain, CP, N/V/C/D, and vision changes  Objective:   No results found. Recent Labs    08/26/21 1047  WBC 5.0  HGB 12.4  HCT 38.4  PLT 315    Recent Labs    08/26/21 1047  NA 141  K 4.3  CL 109  CO2 27  GLUCOSE 92  BUN 24*  CREATININE 0.97  CALCIUM 9.8     Intake/Output Summary (Last 24 hours) at 08/28/2021 1805 Last data filed at 08/28/2021 0700 Gross per 24 hour  Intake 354 ml  Output --  Net 354 ml        Physical Exam: Vital Signs Blood pressure 108/63, pulse 68, temperature 98 F (36.7 C), resp. rate 16, height 5\' 7"  (1.702 m), weight 60.8 kg, SpO2 100 %.      General: awake, alert, appropriate, sitting up in bed; finished tray;  NAD HENT: conjugate gaze; oropharynx moist CV: regular rate; no JVD Pulmonary: CTA B/L; no W/R/R- good air movement GI: soft, NT, ND, (+)BS Psychiatric: appropriate Neurological: Ox1 Genitourinary:    Comments: Has foley in place- medium amber urine in bag- no leakage this AM- same Musculoskeletal:     Cervical back: Neck supple. No tenderness.     Comments: RUE- deltoid, biceps, triceps 4+/5; otherwise 5-/5 LUE 5/5  RLE- HF 4+/5 and R PF 4+/5; otherwise 5-/5 LLE- 5-/5 throughout  Skin:    General: Skin is warm and dry.     Comments: L forearm IV- a little blood flash in IV- looks OK otherwise  Neurological:     Mental Status: She is alert.     Comments: Limited historian Intact to light touch in all 4 extremities and face Ox2  Assessment/Plan: 1. Functional deficits which require 3+ hours per day of interdisciplinary therapy in a comprehensive inpatient rehab setting. Physiatrist is providing close team supervision and 24 hour management of active medical problems listed below. Physiatrist and rehab team continue to assess barriers to discharge/monitor patient progress  toward functional and medical goals  Care Tool:  Bathing    Body parts bathed by patient: Right arm, Left arm, Chest, Abdomen, Front perineal area, Right upper leg, Left upper leg, Face   Body parts bathed by helper: Right lower leg, Left lower leg, Buttocks     Bathing assist Assist Level: Minimal Assistance - Patient > 75%     Upper Body Dressing/Undressing Upper body dressing   What is the patient wearing?: Bra, Pull over shirt    Upper body assist Assist Level: Minimal Assistance - Patient > 75%    Lower Body Dressing/Undressing Lower body dressing      What is the patient wearing?: Underwear/pull up, Pants     Lower body assist Assist for lower body dressing: Moderate Assistance - Patient 50 - 74%     Toileting Toileting Toileting Activity did not occur and hygiene only):  (for foley care)  Toileting assist Assist for toileting: Maximal Assistance - Patient 25 - 49%     Transfers Chair/bed transfer  Transfers assist     Chair/bed  transfer assist level: Minimal Assistance - Patient > 75% Chair/bed transfer assistive device: ArboriculturistWalker   Locomotion Ambulation   Ambulation assist      Assist level: Supervision/Verbal cueing Assistive device: Walker-rolling Max distance: 150'   Walk 10 feet activity   Assist  Walk 10 feet activity did not occur: Safety/medical concerns  Assist level: Supervision/Verbal cueing Assistive device: Walker-rolling   Walk 50 feet activity   Assist Walk 50 feet with 2 turns activity did not occur: Safety/medical concerns  Assist level: Supervision/Verbal cueing Assistive device: Walker-rolling    Walk 150 feet activity   Assist Walk 150 feet activity did not occur: Safety/medical concerns  Assist level: Supervision/Verbal cueing Assistive device: Walker-rolling    Walk 10 feet on uneven surface  activity   Assist Walk 10 feet on uneven surfaces activity did not occur: Safety/medical  concerns         Wheelchair     Assist Is the patient using a wheelchair?: No Type of Wheelchair: Manual    Wheelchair assist level: Total Assistance - Patient < 25% Max wheelchair distance: >150 ft    Wheelchair 50 feet with 2 turns activity    Assist        Assist Level: Total Assistance - Patient < 25%   Wheelchair 150 feet activity     Assist      Assist Level: Total Assistance - Patient < 25%   Blood pressure 108/63, pulse 68, temperature 98 F (36.7 C), resp. rate 16, height 5\' 7"  (1.702 m), weight 60.8 kg, SpO2 100 %.  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 with R hemiparesis ELOS 16-20 days min A Patient able to shower. D/c set for 6/9-  Con't CIR- PT, OT and SLP- team conference today to finalize d/c.   D/c tomorrow to SNF 2. DVT Prophylaxis/Anticoagulation: Pharmaceutical: Lovenox             On ASA and Plavix- Plavix for 21 days 2. Pain Management: Continue scheduled tylenol 325 mg q6 hours and con't prn as well- might need tramadol, however makes her constipated  5/28- will add tramadol per pt request prn- 50 mg q6 hours prn  6/1- still using just tylenol scheduled- will ask therapy if pain is affecting therapy.   6/6- leg pain this Am- is new for her to sk, so asked staff to give 1 dose tramadol.  3. Mood: Hx of anxiety > depression- will continue Prozac that she's been on for 20 years- might need to increase it and might need to see Neuropsychology.  4. Patient is not able to make decisions for herself due to stroke and per family, recent reduction in mentation.  5. Hypotension/bradycardia: decrease Norvasc to 2.5mg  6. HLD- will continue statin that was started per Neurology 7. L sacral ala fracture- is WBAT on B/L LE's, however will treat pain, since tx is conservative. Might require Tramadol.  8. Constipation- is chronic for pt- will schedule Miralax QOD for now;  however if add tramadol, will make daily.   6/1- LBM yesterday afternoon- not clear if took sorbitol or just went.   6/6- LBM yesterday- con't regimen with miralax.  9. Chronic foley due to bladder spasms- just changed on day of admission- pt accidentally pulled it out- will try to use leg bag as much as possible.   6/5- had to be changed yesterday due to leakage? 10.  Recent kyphoplasty 07/08/21- T8/T12/L1 for compression fractures.  11. Osteoporosis/Vit D Deficiency-  continue Vit D and monitor for falls- since has hx of frequent falls.  12. Sundowning at home- will monitor for behavior- might need something for sundowning.  13. Insomnia  5/28- will add Trazodone 25 mg QHS for sleep- might need more, but since 88, will start low.   5/29 Reports sleep improved, monitor  5/30- didn't sleep as well- will increase trazodone to 50 mg QHS  5/31- per pt request, will change back to 25 mg QHS.   Sleeping much better per pt- continue regimen 14.Mild Azotemia  -overall stable, advised to drink fluids  6/1- BUN 26 and Cr 1.02- will recheck again in Am and if getting worse, will give IVFs.   6/2- BUN still 26- will recheck Monday and give IVFs if needed.   6/6- BUN 24- Cr 0.97- will con't to monitor, and push fluids 15. Sleepiness during day  6/5- will try Amantadine 100 mg daily.   6/6- much more awake this AM- con't Amantadine for now  6/7- con't- doing well    LOS: 11 days A FACE TO FACE EVALUATION WAS PERFORMED  Marie Mitchell 08/28/2021, 6:05 PM

## 2021-08-28 NOTE — Progress Notes (Signed)
Speech Language Pathology Discharge Summary  Patient Details  Name: Marie Mitchell MRN: [id number] Date of Birth: [birth year]  Today's Date: 08/28/2021 SLP Individual Time: 1101-1145 SLP Individual Time Calculation (min): 44 min   Skilled Therapeutic Interventions:  Skilled ST services focused on language skills. SLP facilitated assessment of reading comprehension with RCAB, pt demonstrated ability to read at word level 100% accuracy and sentence/paragraph level with 80% accuracy, further impacted by baseline deficits in recall. Pt was left in room with call bell within reach and chair alarm set. SLP recommends to continue skilled services.    Patient has met 3 of 3 long term goals.  Patient to discharge at overall Supervision level.  Reasons goals not met:     Clinical Impression/Discharge Summary:   Pt made great progress meeting 3 out 3 goals discharging at supervision A. Pt is tolerating regular textures and thin liquid diet with extra time to consume small bites/sips and intermittent supervision A. Further dysphagia services are not needed at discharge. Pt has demonstrated improvement in expressing wants/needs at the sentence level, speech intelligibility at the sentence level (slowing rate and increasing vocal intensity), naming daily information and moderate A for higher level word finding (divergent naming.) Pt demonstrates ability to read at sentence and paragraph level with 80% accuracy. Witting was not assessed, but pt supports acute changes. Family confirms baseline cognition and have completed education. Pt benefited from skilled ST services in order to maximize functional independence and reduce burden of care, requiring 24 hour supervision at discharge with continued skilled ST services.  Care Partner:  Caregiver Able to Provide Assistance: Yes  Type of Caregiver Assistance: Physical;Cognitive  Recommendation:  24 hour supervision/assistance;Skilled Nursing facility  Rationale  for SLP Follow Up: Maximize functional communication;Maximize cognitive function and independence;Reduce caregiver burden   Equipment: N/A   Reasons for discharge: Discharged from hospital   Patient/Family Agrees with Progress Made and Goals Achieved: Yes    [name] 08/28/2021, 1:49 PM

## 2021-08-28 NOTE — Progress Notes (Signed)
Occupational Therapy Session Note  Patient Details  Name: Marie Mitchell MRN: [id number] Date of Birth: 08/01/1933  Today's Date: 08/28/2021 OT Group Time: 1430-1530 OT Group Time Calculation (min): 60 min   Short Term Goals: Week 2:  OT Short Term Goal 1 (Week 2): STG=LTG 2/2 ELOS  Skilled Therapeutic Interventions/Progress Updates:  Pt participated in group session with a focus on stress mgmt, education on healthy coping strategies, and social interaction. Focus of session on providing coping strategies to manage new diagnosis to allow for improved mental health to increase overall quality of life . Discussed how to break down stressors into "daily hassles," "major life stressors" and "life circumstances" in an effort to allow pts to chunk their stressors into groups and determine where to best put their efforts/time when dealing with stress. Pt actively sharing stressors and contributing to group conversation. Provided active listening, emotional support and therapeutic use of self. Offered education on factors that protect [place] against stress such as "daily uplifts," "healthy coping strategies" and "protective factors." Encouraged all group members to make an effort to actively recall one event from their day that was a daily uplift in an effort to protect their mindset from stressors as well as sharing this information with their caregivers to facilitate improved caregiver communication and decrease overall burden of care.  Issued pt handouts on healthy coping strategies to implement into routine.  Pt transported back to room by this OTA where pt completed stand pivot back to bed with RW and MOD A. MINA for sit>supine. Pt left supine with bed alarm activated and all needs within reach.   Therapy Documentation Precautions:  Precautions Precautions: Fall Restrictions Weight Bearing Restrictions: Yes LUE Weight Bearing: Weight bearing as tolerated LLE Weight Bearing: Weight bearing as  tolerated  Pain: no pain reported during session     Therapy/Group: Group Therapy  Barron Schmid 08/28/2021, 4:12 PM

## 2021-08-28 NOTE — Progress Notes (Signed)
Physical Therapy Discharge Summary  Patient Details  Name: Marie Mitchell MRN: [id number] Date of Birth: 06-Jan-1934  Patient has met 5 of 8 long term goals due to improved activity tolerance, improved balance, improved postural control, and ability to compensate for deficits.  Patient to discharge at an ambulatory level Supervision.   Patient's care partner unavailable to provide the necessary physical and cognitive assistance at discharge, therefore pt will d/c to LTC. Pt's family was present for family education and to see pt's current assist level.  Reasons goals not met: Pt did not meet sit to stand goal of Supervision as she still requires min A due to ongoing posterior lean/bias with transfer which pt had prior to her CVA. She also did not meet transfer and car transfer goal of Supervision as she still requires CGA to min A for safety. Pt had met goals adequately for a safe d/c to LTC.  Recommendation:  Patient will benefit from ongoing skilled PT services in  long-term care setting  to continue to advance safe functional mobility, address ongoing impairments in endurance, balance, safety, independence with functional mobility, motor planning, attention, and minimize fall risk.  Equipment: RW  Reasons for discharge: treatment goals met and discharge from hospital  Patient/family agrees with progress made and goals achieved: Yes  PT Discharge Precautions/Restrictions Precautions Precautions: Fall Restrictions Weight Bearing Restrictions: Yes LLE Weight Bearing: Weight bearing as tolerated Pain Interference Pain Interference Pain Effect on Sleep: 1. Rarely or not at all Pain Interference with Therapy Activities: 1. Rarely or not at all Pain Interference with Day-to-Day Activities: 1. Rarely or not at all Vision/Perception  Vision - History Ability to See in Adequate Light: 0 Adequate Perception Perception: Within Functional Limits Praxis Praxis: Impaired Praxis Impairment  Details: Motor planning  Cognition Overall Cognitive Status: History of cognitive impairments - at baseline Arousal/Alertness: Awake/alert Orientation Level: Oriented to person Year: 2023 Month: November Day of Week: Correct Attention: Focused;Sustained Focused Attention: Appears intact Sustained Attention: Impaired Memory: Impaired Memory Impairment: Decreased recall of new information Awareness: Appears intact Problem Solving: Impaired Problem Solving Impairment: Verbal basic;Functional basic Safety/Judgment: Impaired Sensation Sensation Light Touch: Appears Intact Hot/Cold: Appears Intact Proprioception: Appears Intact Additional Comments: difficult to assess due to cognition Coordination Gross Motor Movements are Fluid and Coordinated: No Fine Motor Movements are Fluid and Coordinated: No Coordination and Movement Description: impaired 2/2 R hemi Motor  Motor Motor: Hemiplegia;Abnormal postural alignment and control Motor - Skilled Clinical Observations: Slight impaired midline orientation and motor planning Motor - Discharge Observations: impaired midline orientation, mild R hemi  Mobility Bed Mobility Bed Mobility: Rolling Right;Rolling Left;Supine to Sit;Sit to Supine Rolling Right: Independent with assistive device Rolling Left: Independent with assistive device Supine to Sit: Independent with assistive device Sitting - Scoot to Edge of Bed: Supervision/Verbal cueing Sit to Supine: Independent with assistive device Transfers Transfers: Sit to Stand;Stand Pivot Transfers Sit to Stand: Minimal Assistance - Patient > 75% Stand Pivot Transfers: Minimal Assistance - Patient > 75% Stand Pivot Transfer Details: Tactile cues for sequencing;Tactile cues for posture;Verbal cues for technique Transfer (Assistive device): Rolling walker Locomotion  Gait Ambulation: Yes Gait Assistance: Supervision/Verbal cueing Gait Distance (Feet): 150 Feet Assistive device: Rolling  walker Gait Assistance Details: Verbal cues for precautions/safety;Verbal cues for safe use of DME/AE Gait Gait: Yes Gait Pattern: Impaired Gait Pattern: Decreased step length - right;Decreased step length - left;Trunk flexed Gait velocity: significantly decreased Stairs / Additional Locomotion Stairs: No (pt did perform stairs at baseline) Pick up small  object from the floor assist level: Minimal Assistance - Patient > 75% Pick up small object from the floor assistive device: RW and Runner, broadcasting/film/video Wheelchair Mobility: No  Trunk/Postural Assessment  Cervical Assessment Cervical Assessment: Exceptions to [facility] (forward head) Thoracic Assessment Thoracic Assessment: Exceptions to [facility] (rounded shoulders) Lumbar Assessment Lumbar Assessment: Exceptions to [facility] (posterior pelvic tilt) Postural Control Postural Control: Deficits on evaluation Righting Reactions: impaired, posterior bias upon initially standing  Balance Balance Balance Assessed: Yes Standardized Balance Assessment Standardized Balance Assessment: Berg Balance Test Berg Balance Test Sit to Stand: Needs minimal aid to stand or to stabilize Standing Unsupported: Able to stand 2 minutes with supervision Sitting with Back Unsupported but Feet Supported on Floor or Stool: Able to sit safely and securely 2 minutes Stand to Sit: Uses backs of legs against chair to control descent Transfers: Needs one person to assist Standing Unsupported with Eyes Closed: Able to stand 10 seconds with supervision Standing Ubsupported with Feet Together: Able to place feet together independently and stand for 1 minute with supervision From Standing, Reach Forward with Outstretched Arm: Reaches forward but needs supervision From Standing Position, Pick up Object from Floor: Unable to try/needs assist to keep balance From Standing Position, Turn to Look Behind Over each Shoulder: Needs assist to keep from losing balance and  falling Turn 360 Degrees: Needs assistance while turning Standing Unsupported, Alternately Place Feet on Step/Stool: Needs assistance to keep from falling or unable to try Standing Unsupported, One Foot in Front: Needs help to step but can hold 15 seconds Standing on One Leg: Unable to try or needs assist to prevent fall Total Score: 19 Static Sitting Balance Static Sitting - Balance Support: Feet supported;No upper extremity supported Static Sitting - Level of Assistance: 5: Stand by assistance Dynamic Sitting Balance Dynamic Sitting - Balance Support: Feet supported;No upper extremity supported Dynamic Sitting - Level of Assistance: 5: Stand by assistance Static Standing Balance Static Standing - Balance Support: Bilateral upper extremity supported;During functional activity Static Standing - Level of Assistance: 5: Stand by assistance;4: Min assist Dynamic Standing Balance Dynamic Standing - Balance Support: Bilateral upper extremity supported;During functional activity Dynamic Standing - Level of Assistance: 5: Stand by assistance Extremity Assessment  RUE Assessment RUE Assessment: Within Functional Limits LUE Assessment LUE Assessment: Within Functional Limits RLE Assessment RLE Assessment: Within Functional Limits General Strength Comments: Grossly 5/5 throughout in sitting LLE Assessment LLE Assessment: Within Functional Limits General Strength Comments: Grossly 5/5 throughout in sitting     Excell Seltzer, PT, DPT, CSRS 08/28/2021, 4:10 PM

## 2021-08-28 NOTE — Progress Notes (Signed)
Patient ID: Marie Mitchell, female   DOB: Feb 21, 1934, 88 y.o.   MRN: [id number]  SW spoke with pt dtr Dewayne Hatch to discuss if possible to move forward with d/c tomorrow and provided updates from medical team on pt being appropriate for ambulance transport. She is in agreement. SW shared will call and speak with Coby/Admissions with [facility] to discuss further and will f/u once all details in place.   SW left message for Coby Ingram/LTC with [name] (p:[phone]/f:[phone]) to discuss accepting pt tomorrow.   *SW confirms with Coby pt can be accepted to SNF tomorrow. SW will fax order for addtl therapies. Rm #[id number]; RN Report [phone]. [facility] [phone]) scheduled for p/u at 12:30pm. SW spoke with pt dtr Dewayne Hatch to update on above. Confirms she signed paperwork with Admissions team.  SW faxed order to Coby.  [name], MSW, LCSWA Office: [phone] Cell: [phone] Fax: [phone]

## 2021-08-28 NOTE — Evaluation (Signed)
Recreational Therapy Assessment and Plan  Patient Details  Name: Marie Mitchell MRN: [id number] Date of Birth: 07/09/1933 Today's Date: 08/28/2021  Rehab Potential:  Good ELOS:   d/c 6/8  Assessment   Hospital Problem: Principal Problem:   Left basal ganglia embolic stroke [facility])     Past Medical History: History reviewed. No pertinent past medical history. Past Surgical History: History reviewed. No pertinent surgical history.   Assessment & Plan Clinical Impression: Patient is a 88 y.o. year old R handed female with past medical history significant for osteoporosis, vitamin d deficiency, frequent falls, chronic Foley, and anxiety. Presented this admission s/p fall at home on 08/12/21, which happened after closing her blinds at home.  Patient is unsure if there was a LOC or head injury.  Initially she did not go to the ED.  However, in the afternoon on 08/13/21 she became increasingly confused with difficulty with seeing and writing. Upon going to the kitchen to prepare dinner her legs became weak and she fell, which resulted in LOC and head injury.  She then went to the ED with her daughter, at which time she had slurred speech and difficulty drinking water without chocking.   MRI brain brain shows acute perforator infarct in the left basal ganglia, extending into overlying corona radiata, with slight edema without mass effect. CT head shows chronic atrophy and small vessel ischemic changes, with no acute intracranial abnormalities.  CTA head negative for large vessel occlusion. 2D echo, normal EF 55-60%. CT left hip was negative for acute hip fracture. Linear lucency and cortical offset in the inferior aspect of the lateral aspect of the sacrum on the left, possible nondisplaced fracture. MR sacrum demonstrated acute nondisplaced fracture of the inferior aspect of the left sacral ala extending into the S3 vertebral body with associated marrow edema and adjacent soft tissue edema. Patient  transferred to CIR on 08/17/2021 .    Pt presents with decreased activity tolerance, decreased functional mobility, decreased balance, feelings of stress Limiting pt's independence with leisure/community pursuits.  Full eval deferred as pt is expected to discharge tomorrow.  Pt did actively participate  in a stress managment/coping group today.  Pt education/discussion focused on stress exploration including factors that contribute to stress, factors that protect against stress and potential coping strategies.  Coping strategies included deep breathing, progressive muscle relaxation, imagery & challenging irrational thoughts.  Handouts provided.  Pt appreciative of this group discussion.  Plan  No further TR as pt is expected to discharge tomorrow  Recommendations for other services: None   Discharge Criteria: Patient will be discharged from TR if patient refuses treatment 3 consecutive times without medical reason.  If treatment goals not met, if there is a change in medical status, if patient makes no progress towards goals or if patient is discharged from hospital.  The above assessment, treatment plan, treatment alternatives and goals were discussed and mutually agreed upon: by patient  [place] 08/28/2021, 3:55 PM

## 2021-08-29 NOTE — Progress Notes (Signed)
Patient ID: Marie Mitchell, female   DOB: 12-28-1933, 88 y.o.   MRN: [id number]  SW left message for Coby Ingram/LTC with [name] (p:[phone]/f:[phone]) d/c summary has been uploaded into Epic.    SW left d/c packet at front desk and informed pt assigned RN.   [name], MSW, LCSWA Office: [phone] Cell: [phone] Fax: [phone]

## 2021-08-29 NOTE — Progress Notes (Signed)
Inpatient Rehabilitation Care Coordinator Discharge Note   Patient Details  Name: Marie Mitchell MRN: [id number] Date of Birth: 09-17-1933   Discharge location: D/c to SNF [facility] unit  Length of Stay: 11 days  Discharge activity level: Supervision to Min Asst  Home/community participation: Limited  Patient response DS:KAJGOT Literacy - How often do you need to have someone help you when you read instructions, pamphlets, or other written material from your doctor or pharmacy?: Never  Patient response LX:BWIOMB Isolation - How often do you feel lonely or isolated from those around you?: Patient unable to respond  Services provided included: MD, RD, PT, OT, SLP, RN, CM, Pharmacy, Neuropsych, SW, TR  Financial Services:  Financial Services Utilized: Medicare    Choices offered to/list presented to: Yes  Follow-up services arranged:  Other (Comment) (N/A-SNF)   Patient response to transportation need: Is the patient able to respond to transportation needs?: Yes In the past 12 months, has lack of transportation kept you from medical appointments or from getting medications?: No In the past 12 months, has lack of transportation kept you from meetings, work, or from getting things needed for daily living?: No   Comments (or additional information):  Patient/Family verbalized understanding of follow-up arrangements:  Yes  Individual responsible for coordination of the follow-up plan: contact pt dtr Dewayne Hatch  Confirmed correct DME delivered: Gretchen Short 08/29/2021    Gretchen Short

## 2021-08-29 NOTE — Progress Notes (Signed)
PROGRESS NOTE   Subjective/Complaints: Pt reports she' is ready for d/c to SNF today- not happy about it.   ROS:  Pt denies SOB, abd pain, CP, N/V/C/D, and vision changes  Objective:   No results found. Recent Labs    08/26/21 1047  WBC 5.0  HGB 12.4  HCT 38.4  PLT 315    Recent Labs    08/26/21 1047  NA 141  K 4.3  CL 109  CO2 27  GLUCOSE 92  BUN 24*  CREATININE 0.97  CALCIUM 9.8     Intake/Output Summary (Last 24 hours) at 08/29/2021 0732 Last data filed at 08/28/2021 1950 Gross per 24 hour  Intake 117 ml  Output 450 ml  Net -333 ml        Physical Exam: Vital Signs Blood pressure 123/75, pulse 64, temperature 97.9 F (36.6 C), resp. rate 18, height 5\' 7"  (1.702 m), weight 60.8 kg, SpO2 99 %.       General: awake, alert, appropriate, drinking- ate 100% tray; sitting up in bed; NAD HENT: conjugate gaze; oropharynx moist CV: regular rate; no JVD Pulmonary: CTA B/L; no W/R/R- good air movement GI: soft, NT, ND, (+)BS Psychiatric: appropriate Neurological: Ox1-2 Genitourinary:    Comments: Has foley in place- medium amber urine in bag- no leakage this AM- same Musculoskeletal:     Cervical back: Neck supple. No tenderness.     Comments: RUE- deltoid, biceps, triceps 4+/5; otherwise 5-/5 LUE 5/5  RLE- HF 4+/5 and R PF 4+/5; otherwise 5-/5 LLE- 5-/5 throughout  Skin:    General: Skin is warm and dry.     Comments: L forearm IV- a little blood flash in IV- looks OK otherwise  Neurological:     Mental Status: She is alert.     Comments: Limited historian Intact to light touch in all 4 extremities and face Ox2  Assessment/Plan: 1. Functional deficits which require 3+ hours per day of interdisciplinary therapy in a comprehensive inpatient rehab setting. Physiatrist is providing close team supervision and 24 hour management of active medical problems listed below. Physiatrist and rehab team  continue to assess barriers to discharge/monitor patient progress toward functional and medical goals  Care Tool:  Bathing    Body parts bathed by patient: Right arm, Left arm, Chest, Abdomen, Front perineal area, Right upper leg, Left upper leg, Face   Body parts bathed by helper: Right lower leg, Left lower leg, Buttocks     Bathing assist Assist Level: Minimal Assistance - Patient > 75%     Upper Body Dressing/Undressing Upper body dressing   What is the patient wearing?: Pull over shirt    Upper body assist Assist Level: Minimal Assistance - Patient > 75%    Lower Body Dressing/Undressing Lower body dressing      What is the patient wearing?: Pants     Lower body assist Assist for lower body dressing: Moderate Assistance - Patient 50 - 74%     Toileting Toileting Toileting Activity did not occur Press photographer(Clothing management and hygiene only):  (for foley care)  Toileting assist Assist for toileting: Maximal Assistance - Patient 25 - 49%     Transfers Chair/bed  transfer  Transfers assist     Chair/bed transfer assist level: Minimal Assistance - Patient > 75% Chair/bed transfer assistive device: Geologist, engineering   Ambulation assist      Assist level: Supervision/Verbal cueing Assistive device: Walker-rolling Max distance: 150'   Walk 10 feet activity   Assist  Walk 10 feet activity did not occur: Safety/medical concerns  Assist level: Supervision/Verbal cueing Assistive device: Walker-rolling   Walk 50 feet activity   Assist Walk 50 feet with 2 turns activity did not occur: Safety/medical concerns  Assist level: Supervision/Verbal cueing Assistive device: Walker-rolling    Walk 150 feet activity   Assist Walk 150 feet activity did not occur: Safety/medical concerns  Assist level: Supervision/Verbal cueing Assistive device: Walker-rolling    Walk 10 feet on uneven surface  activity   Assist Walk 10 feet on uneven surfaces  activity did not occur: Safety/medical concerns         Wheelchair     Assist Is the patient using a wheelchair?: No Type of Wheelchair: Manual    Wheelchair assist level: Total Assistance - Patient < 25% Max wheelchair distance: >150 ft    Wheelchair 50 feet with 2 turns activity    Assist        Assist Level: Total Assistance - Patient < 25%   Wheelchair 150 feet activity     Assist      Assist Level: Total Assistance - Patient < 25%   Blood pressure 123/75, pulse 64, temperature 97.9 F (36.6 C), resp. rate 18, height 5\' 7"  (1.702 m), weight 60.8 kg, SpO2 99 %.  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 with R hemiparesis ELOS 16-20 days min A Patient able to shower. D/c set for 6/9-  D/c today to SNF- can have f/u with me if daughter wants to- but won't need regular appointments 2. DVT Prophylaxis/Anticoagulation: Pharmaceutical: Lovenox             On ASA and Plavix- Plavix for 21 days 2. Pain Management: Continue scheduled tylenol 325 mg q6 hours and con't prn as well- might need tramadol, however makes her constipated  5/28- will add tramadol per pt request prn- 50 mg q6 hours prn  6/1- still using just tylenol scheduled- will ask therapy if pain is affecting therapy.   6/6- leg pain this Am- is new for her to sk, so asked staff to give 1 dose tramadol.   6/8- suggest having tramadol prn at SNF 3. Mood: Hx of anxiety > depression- will continue Prozac that she's been on for 20 years- might need to increase it and might need to see Neuropsychology.  4. Patient is not able to make decisions for herself due to stroke and per family, recent reduction in mentation.  5. Hypotension/bradycardia: decrease Norvasc to 2.5mg  6. HLD- will continue statin that was started per Neurology 7. L sacral ala fracture- is WBAT on B/L LE's, however will treat pain, since tx is conservative. Might  require Tramadol.  8. Constipation- is chronic for pt- will schedule Miralax QOD for now; however if add tramadol, will make daily.   6/1- LBM yesterday afternoon- not clear if took sorbitol or just went.   6/6- LBM yesterday- con't regimen with miralax.  9. Chronic foley due to bladder spasms- just changed on day of admission- pt accidentally pulled it out- will try to use leg bag as much as possible.   6/5- had to be changed yesterday  due to leakage? 10.  Recent kyphoplasty 07/08/21- T8/T12/L1 for compression fractures.  11. Osteoporosis/Vit D Deficiency- continue Vit D and monitor for falls- since has hx of frequent falls.  12. Sundowning at home- will monitor for behavior- might need something for sundowning.  13. Insomnia  5/28- will add Trazodone 25 mg QHS for sleep- might need more, but since 88, will start low.   5/29 Reports sleep improved, monitor  5/30- didn't sleep as well- will increase trazodone to 50 mg QHS  5/31- per pt request, will change back to 25 mg QHS.   Sleeping much better per pt- continue regimen 14.Mild Azotemia  -overall stable, advised to drink fluids  6/1- BUN 26 and Cr 1.02- will recheck again in Am and if getting worse, will give IVFs.   6/2- BUN still 26- will recheck Monday and give IVFs if needed.   6/6- BUN 24- Cr 0.97- will con't to monitor, and push fluids 15. Sleepiness during day  6/5- will try Amantadine 100 mg daily.   6/6- much more awake this AM- con't Amantadine for now  6/7- con't- doing well  6/8- continue Amantadine for a minimum of 3 months     LOS: 12 days A FACE TO FACE EVALUATION WAS PERFORMED  Effa Yarrow 08/29/2021, 7:32 AM

## 2021-08-30 NOTE — Progress Notes (Signed)
Inpatient Rehabilitation Discharge Medication Review by a Pharmacist  A complete drug regimen review was completed for this patient to identify any potential clinically significant medication issues.  High Risk Drug Classes Is patient taking? Indication by Medication  Antipsychotic No   Anticoagulant No   Antibiotic    Opioid Yes Tramadol: PRN pain  Antiplatelet Yes Aspirin, clopidogrel: stroke ppx  Hypoglycemics/insulin No   Vasoactive Medication No   Chemotherapy No   Other Yes Amantadine: fatigue/alertness Atorvastatin: HLD Ca Carbonate, Vit D, Vit B-12: supplement Fluoxetine: mood Miralax, Senna-S: constipation Trazodone: mood, sleep     Type of Medication Issue Identified Description of Issue Recommendation(s)  Drug Interaction(s) (clinically significant)     Duplicate Therapy     Allergy     No Medication Administration End Date     Incorrect Dose     Additional Drug Therapy Needed     Significant med changes from prior encounter (inform family/care partners about these prior to discharge). Discontinued medications: - amlodipine Communicate medication changes with patient/family at discharge  Other       Clinically significant medication issues were identified that warrant physician communication and completion of prescribed/recommended actions by midnight of the next day:  No   Pharmacist comments: n/a   Time spent performing this drug regimen review (minutes): 20   Thank you for allowing pharmacy to be a part of this patient's care.  Thelma Barge, PharmD Clinical Pharmacist

## 2021-09-02 ENCOUNTER — Telehealth: Payer: Self-pay

## 2021-09-02 NOTE — Telephone Encounter (Addendum)
Transitional Care Call--who you spoke with Marie Mitchell (Daughter) Phone [phone]   Are you/is patient experiencing any problems since coming home? Patient is  at [facility].  Are there any questions regarding any aspect of care? After consulting with Marissa Nestle PA at this time patient does not need to follow up, here at [facility].  Are there any questions regarding medications administration/dosing? Are meds being taken as prescribed? Patient should review meds with caller to confirm Have there been any falls? Has Home Health been to the house and/or have they contacted you? If not, have you tried to contact them? Can we help you contact them? Are bowels and bladder emptying properly? Are there any unexpected incontinence issues? If applicable, is patient following bowel/bladder programs? Any fevers, problems with breathing, unexpected pain? Are there any skin problems or new areas of breakdown? Has the patient/family member arranged specialty MD follow up (ie cardiology/neurology/renal/surgical/etc)?  Can we help arrange? All after visit locations & phone numbers given to [facility] to call. To see if follow up appointments will be needed per discharge summary. Does the patient need any other services or support that we can help arrange? Marie Mitchell has been advised to call back if she need to for guidance.  Are caregivers following through as expected in assisting the patient?        11. Has the patient quit smoking, drinking alcohol, or using drugs as recommended?  Appointment Date/Time/ Arrival time/ and who they are seeing [address]

## 2021-09-20 ENCOUNTER — Encounter: Payer: Medicare Other | Admitting: Physical Medicine & Rehabilitation

## 2021-10-10 ENCOUNTER — Encounter: Payer: Self-pay | Admitting: Diagnostic Neuroimaging

## 2021-10-10 ENCOUNTER — Ambulatory Visit (INDEPENDENT_AMBULATORY_CARE_PROVIDER_SITE_OTHER): Payer: Medicare Other | Admitting: Diagnostic Neuroimaging

## 2021-10-10 VITALS — BP 138/79 | HR 58

## 2021-10-10 DIAGNOSIS — I639 Cerebral infarction, unspecified: Secondary | ICD-10-CM

## 2021-10-10 DIAGNOSIS — I63312 Cerebral infarction due to thrombosis of left middle cerebral artery: Secondary | ICD-10-CM

## 2021-10-10 NOTE — Progress Notes (Signed)
[facility]  PATIENT: Marie Mitchell DOB: 06-16-1933  REFERRING CLINICIAN: Tyrone Nine, MD HISTORY FROM: PATIENT, daughter, son REASON FOR VISIT: new consult   HISTORICAL  CHIEF COMPLAINT:  Chief Complaint  Patient presents with   Follow-up    RM 7 with facility staff Rian  Pt is well, states she cant write with R hand. No other concerns pt mention.     HISTORY OF PRESENT ILLNESS:   UPDATE (10/10/21, VRP): 88 year old female here for evaluation of left basal ganglia stroke.  Patient presented to hospital in May 2023 after falling down and having sacral fracture.  She was having some slurred speech and difficulty drinking water.  MRI of the brain demonstrated acute left basal ganglia ischemic infarction.  Stroke work-up was completed.  Patient was treated with dual antiplatelet therapy for 21 days and then aspirin alone.  Patient went to inpatient rehab and then to skilled nursing facility.  Continues to have some issues with cognitive difficulty, gait and balance issues.  Some of these issues were present prior to the stroke but worsened after the stroke.   REVIEW OF SYSTEMS: Full 14 system review of systems performed and negative with exception of: As per HPI.  ALLERGIES: Allergies  Allergen Reactions   Bacitracin     Not on [facility]    HOME MEDICATIONS: Outpatient Medications Prior to Visit  Medication Sig Dispense Refill   acetaminophen (TYLENOL) 500 MG tablet Take 500 mg by mouth every 6 (six) hours as needed (for pain).     amantadine (SYMMETREL) 100 MG capsule Take 1 capsule (100 mg total) by mouth daily.     aspirin EC 81 MG tablet Take 1 tablet (81 mg total) by mouth daily. Swallow whole. 30 tablet 12   atorvastatin (LIPITOR) 40 MG tablet Take 1 tablet (40 mg total) by mouth daily.     calcium carbonate (OSCAL) 1500 (600 Ca) MG TABS tablet Take 600-1,200 mg by mouth See admin instructions. Give 2 tablets by mouth in the morning then give 1 tablet  every evening     Cholecalciferol 25 MCG (1000 UT) tablet Take 1,000 Units by mouth at bedtime.     FLUoxetine (PROZAC) 20 MG tablet Take 1.5 tablets (30 mg total) by mouth daily.  3   polyethylene glycol (MIRALAX / GLYCOLAX) 17 g packet Take 17 g by mouth daily. 14 each 0   senna-docusate (SENOKOT-S) 8.6-50 MG tablet Take 2 tablets by mouth daily after supper.     sodium chloride (OCEAN) 0.65 % SOLN nasal spray Place 1 spray into both nostrils as needed for congestion.  0   traMADol (ULTRAM) 50 MG tablet Take 1 tablet (50 mg total) by mouth 2 (two) times daily as needed for severe pain. 6 tablet 0   traZODone (DESYREL) 50 MG tablet Take 0.5 tablets (25 mg total) by mouth at bedtime.     vitamin B-12 (CYANOCOBALAMIN) 1000 MCG tablet Take 1,000 mcg by mouth daily.     calcium-vitamin D (OSCAL WITH D) 500-5 MG-MCG tablet Take 2 tablets by mouth daily with breakfast AND 1 tablet at bedtime. (Patient not taking: Reported on 10/10/2021)     clopidogrel (PLAVIX) 75 MG tablet Take 1 tablet (75 mg total) by mouth daily. For 11 more days (Patient not taking: Reported on 10/10/2021)  0   Turmeric 500 MG TABS Take 500 mg by mouth 2 (two) times daily. (Patient not taking: Reported on 10/10/2021)     No facility-administered medications prior to  visit.    PAST MEDICAL HISTORY: History reviewed. No pertinent past medical history.  PAST SURGICAL HISTORY: History reviewed. No pertinent surgical history.  FAMILY HISTORY: History reviewed. No pertinent family history.  SOCIAL HISTORY: Social History   Socioeconomic History   Marital status: Widowed    Spouse name: Not on file   Number of children: Not on file   Years of education: Not on file   Highest education level: Not on file  Occupational History   Not on file  Tobacco Use   Smoking status: Never   Smokeless tobacco: Never  Vaping Use   Vaping Use: Never used  Substance and Sexual Activity   Alcohol use: Yes    Comment: occasional wine    Drug use: Never   Sexual activity: Not Currently  Other Topics Concern   Not on file  Social History Narrative   Not on file   Social Determinants of Health   Financial Resource Strain: Not on file  Food Insecurity: Not on file  Transportation Needs: Not on file  Physical Activity: Not on file  Stress: Not on file  Social Connections: Not on file  Intimate Partner Violence: Not on file     PHYSICAL EXAM  GENERAL EXAM/CONSTITUTIONAL: Vitals:  Vitals:   10/10/21 1454  BP: 138/79  Pulse: (!) 58   There is no height or weight on file to calculate BMI. Wt Readings from Last 3 Encounters:  08/22/21 134 lb 0.6 oz (60.8 kg)  04/22/21 130 lb (59 kg)  02/08/21 130 lb (59 kg)   Patient is in no distress; well developed, nourished and groomed; neck is supple  CARDIOVASCULAR: Examination of carotid arteries is normal; no carotid bruits Regular rate and rhythm, no murmurs Examination of peripheral vascular system by observation and palpation is normal  EYES: Ophthalmoscopic exam of optic discs and posterior segments is normal; no papilledema or hemorrhages No results found.  MUSCULOSKELETAL: Gait, strength, tone, movements noted in Neurologic exam below  NEUROLOGIC: MENTAL STATUS:      No data to display         awake, alert, oriented to person language fluent, comprehension intact, naming intact  CRANIAL NERVE:  2nd, 3rd, 4th, 6th - pupils equal and reactive to light, visual fields full to confrontation, extraocular muscles intact, no nystagmus 5th - facial sensation symmetric 7th - facial strength symmetric 8th - hearing intact 9th - palate elevates symmetrically, uvula midline 11th - shoulder shrug symmetric 12th - tongue protrusion midline  MOTOR:  normal bulk and tone, DIFFUSE 4+ strength in the BUE, BLE  SENSORY:  normal and symmetric to light touch, temperature, vibration; EXCEPT ABSENT AT TOES  COORDINATION:  finger-nose-finger, fine finger  movements SLOW  REFLEXES:  deep tendon reflexes TRACE and symmetric  GAIT/STATION:  IN [facility]     DIAGNOSTIC DATA (LABS, IMAGING, TESTING) - I reviewed patient records, labs, notes, testing and imaging myself where available.  Lab Results  Component Value Date   WBC 5.0 08/26/2021   HGB 12.4 08/26/2021   HCT 38.4 08/26/2021   MCV 94.8 08/26/2021   PLT 315 08/26/2021      Component Value Date/Time   NA 141 08/26/2021 1047   K 4.3 08/26/2021 1047   CL 109 08/26/2021 1047   CO2 27 08/26/2021 1047   GLUCOSE 92 08/26/2021 1047   BUN 24 (H) 08/26/2021 1047   CREATININE 0.97 08/26/2021 1047   CALCIUM 9.8 08/26/2021 1047   PROT 6.3 (L) 08/26/2021 1047  ALBUMIN 3.3 (L) 08/26/2021 1047   AST 36 08/26/2021 1047   ALT 49 (H) 08/26/2021 1047   ALKPHOS 67 08/26/2021 1047   BILITOT 0.6 08/26/2021 1047   GFRNONAA 57 (L) 08/26/2021 1047   GFRAA >60 07/29/2019 1152   Lab Results  Component Value Date   CHOL 236 (H) 08/16/2021   HDL 104 08/16/2021   LDLCALC 119 (H) 08/16/2021   TRIG 66 08/16/2021   CHOLHDL 2.3 08/16/2021   Lab Results  Component Value Date   HGBA1C 5.3 08/15/2021   No results found for: "VITAMINB12" No results found for: "TSH"  HOSPITAL WORKUP: Left basal ganglia / corona radiata stroke likely atheroembolic: HbA1c 5.3%. No cardioembolic source on echo. 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   ASSESSMENT AND PLAN  88 y.o. year old female here with:   Dx:  1. Cerebrovascular accident (CVA) due to thrombosis of left middle cerebral artery (HCC)      PLAN:  LEFT BASAL GANGLIA STROKE (small vessel dz vs artery-artery embolism) - continue med mgmgt: aspirin 81mg , statin, BP control - recommend to wait 3 months  post-stroke before elective procedures (such as cataract surgery)  Return for return to PCP.    , MD 10/10/2021, 3:45 PM Certified in Neurology, Neurophysiology and Neuroimaging  [facility] [address] [place], [place] [phone]

## 2021-10-10 NOTE — Patient Instructions (Addendum)
  LEFT BASAL GANGLIA STROKE - continue aspirin 81mg , statin, BP control - recommend to wait 3 months post-stroke before elective procedures (such as cataract surgery)

## 2023-06-12 IMAGING — CT CT HIP*L* W/O CM
4 of 6 series · 16 of 33 positions shown, 18 images · non-contrast
Comparison: 08/14/2021.

CLINICAL DATA: Hip trauma, fracture suspected.

EXAM:
CT OF THE LEFT HIP WITHOUT CONTRAST
TECHNIQUE: Multidetector CT imaging of the left hip was performed according to
the standard protocol. Multiplanar CT image reconstructions were
also generated.
RADIATION DOSE REDUCTION: This exam was performed according to the
departmental dose-optimization program which includes automated
exposure control, adjustment of the mA and/or kV according to
patient size and/or use of iterative reconstruction technique.

[Series 1: axial bone · axial · 0.51mm/px · z∈[-571,-385]mm · 5 of 141 slices shown, 7 images]
[im 24/141  soft-tissue]
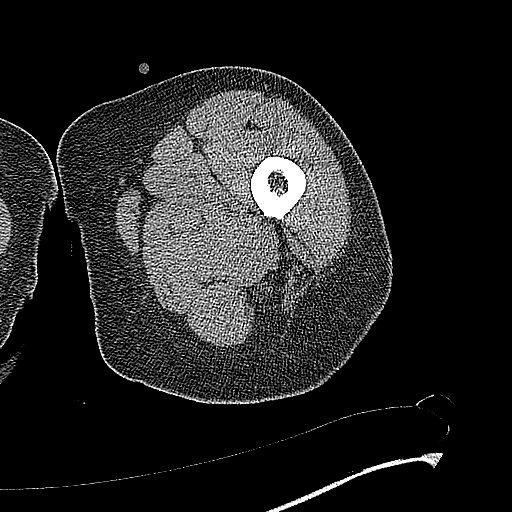
[im 24/141  bone]
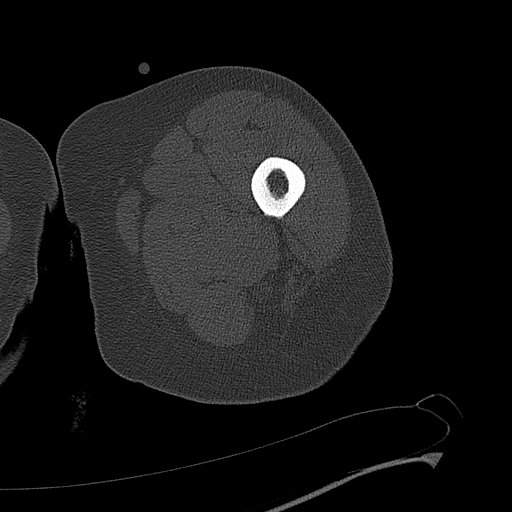
[im 47/141  bone]
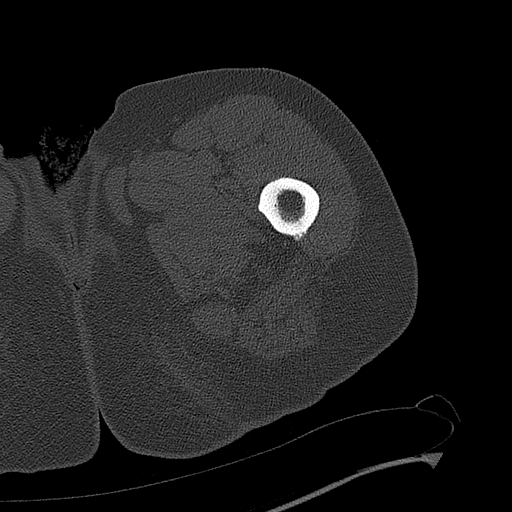
[im 71/141  bone]
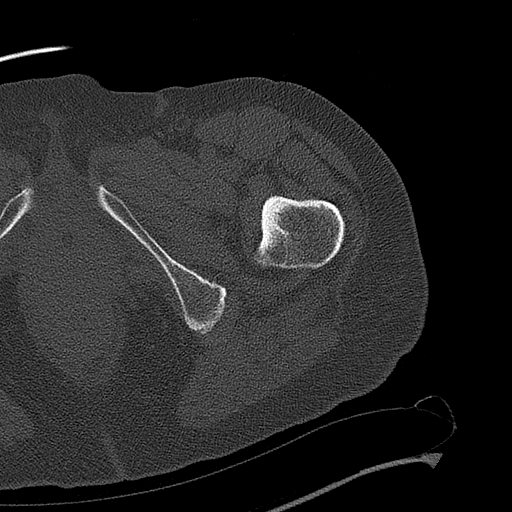
[im 94/141  bone]
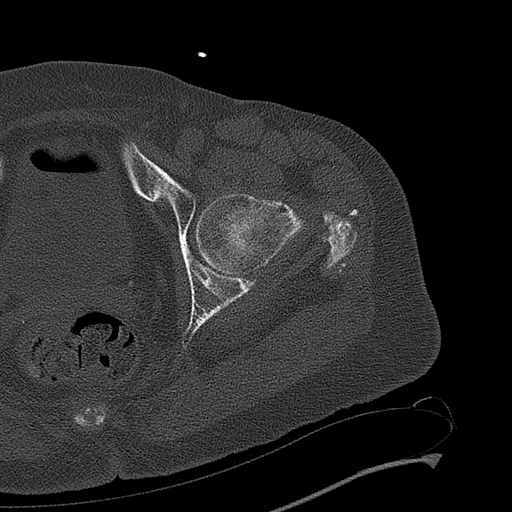
[im 117/141  soft-tissue]
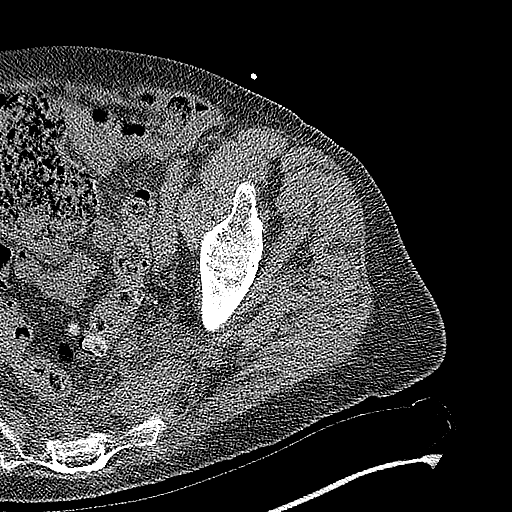
[im 117/141  bone]
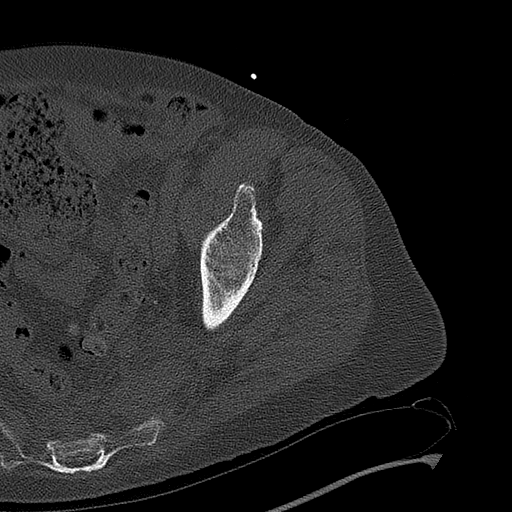

[Series 6: coronal st · coronal · 0.54mm/px · 2 of 108 slices shown]
[im 36/108  bone]
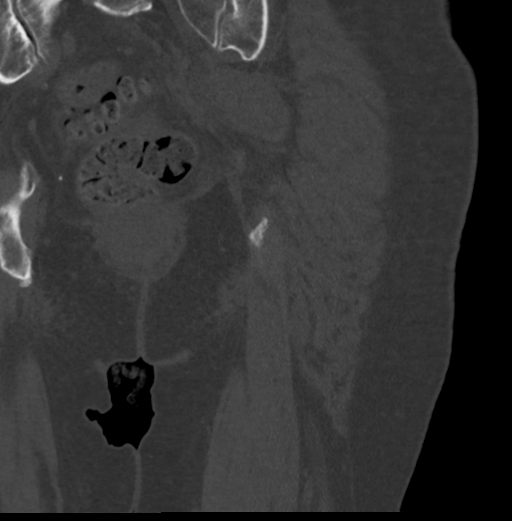
[im 72/108  bone]
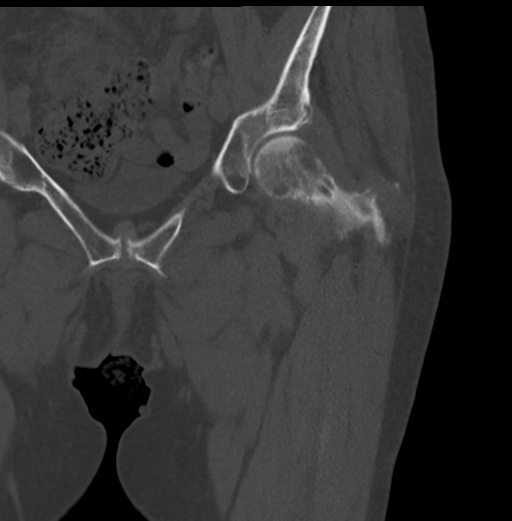

[Series 7: sagittal st · sagittal · 0.55mm/px · 5 of 127 slices shown]
[im 22/127  bone]
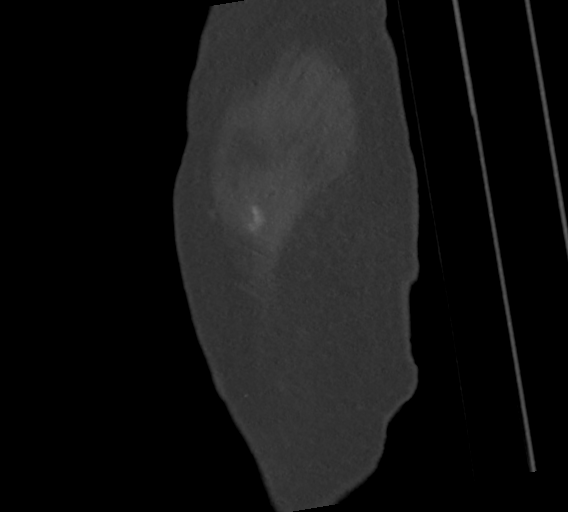
[im 43/127  bone]
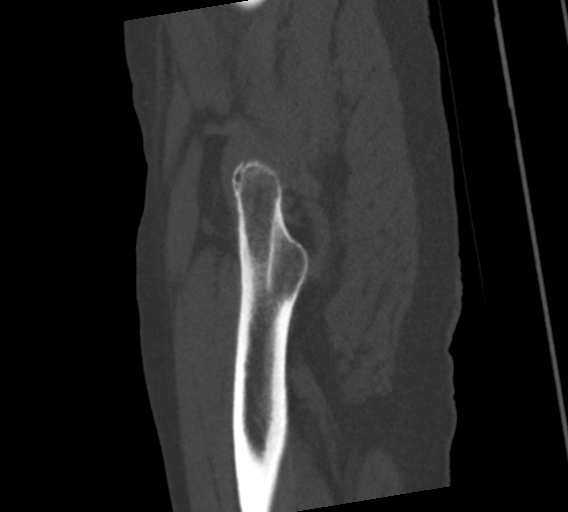
[im 64/127  bone]
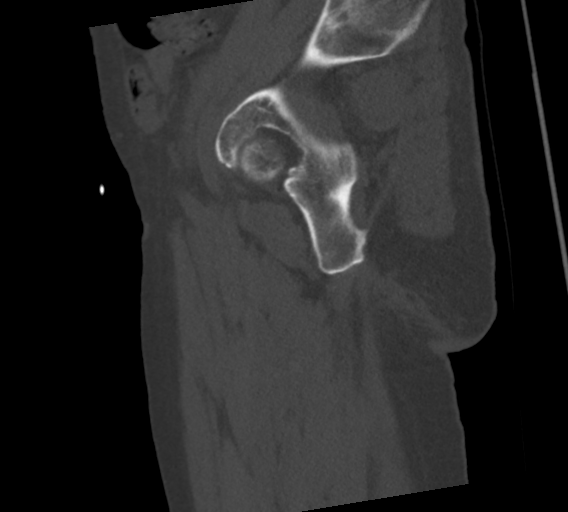
[im 85/127  bone]
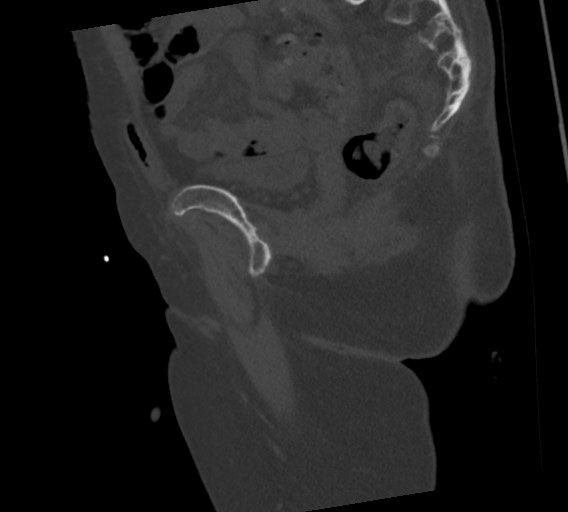
[im 106/127  bone]
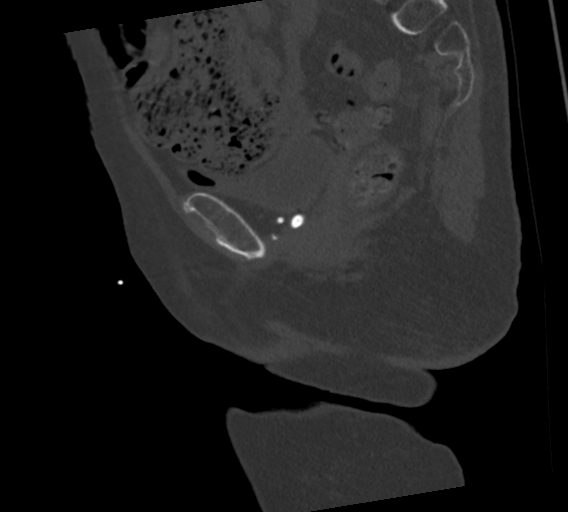

[Series 10: axial st · axial · 0.51mm/px · z∈[-571,-431]mm · 4 of 141 slices shown]
[im 24/141  bone]
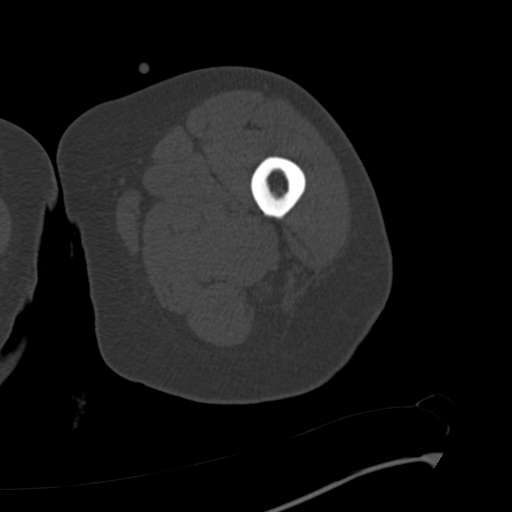
[im 47/141  bone]
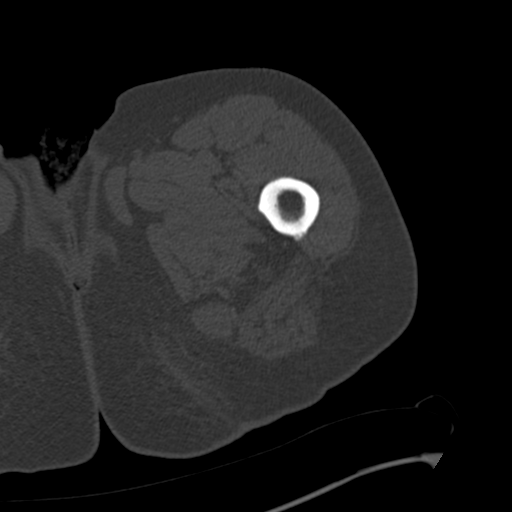
[im 71/141  bone]
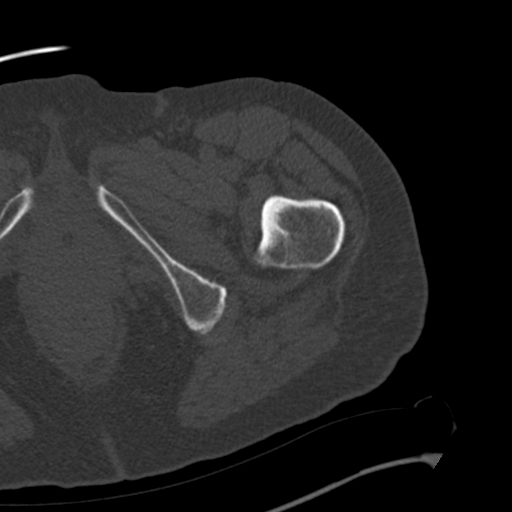
[im 94/141  bone]
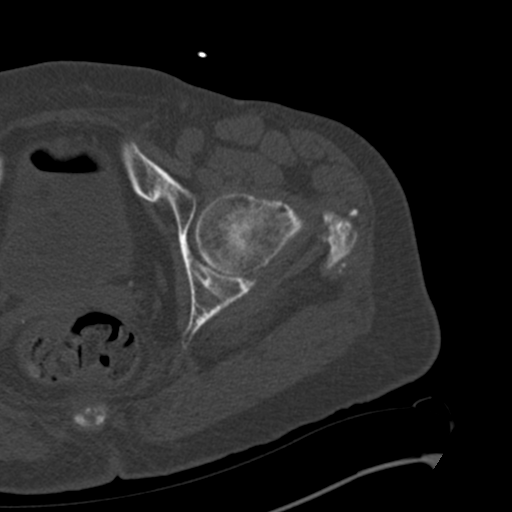

[16 of 33 positions shown; findings below may reference images not displayed]

FINDINGS: Bones/Joint/Cartilage

No acute fracture or dislocation at the left hip. Mild degenerative
changes are present at the left hip. There is a linear lucency
involving the lateral aspect of the inferior sacrum on the left and
cortical offset, suspicious for nondisplaced fracture. Degenerative
changes are noted at the symphysis pubis and sacroiliac joints
bilaterally.

Ligaments

Suboptimally assessed by CT.

Muscles and Tendons

There is mild edema and enlargement of the posterior aspect of the
obturator internus and piriformis muscles on the left in the region
of the presacral space with fat stranding. No significant hematoma
is seen.

Soft tissues

No significant hematoma. Scattered diverticula are noted along the
colon without evidence of diverticulitis. There is a left inguinal
hernia containing nonobstructed bowel. A Foley catheter is present
in the urinary bladder
IMPRESSION: 1. No acute fracture at the left hip.
2. Linear lucency and cortical offset in the inferior aspect of the
lateral aspect of the sacrum on the left, possible nondisplaced
fracture. MRI is suggested for further evaluation.
3. Soft tissue edema and enlargement of the piriformis and obturator
internus muscles on the left. No intramuscular hematoma is
identified.
4. Left inguinal hernia containing nonobstructed bowel.

## 2023-06-12 IMAGING — CR DG HIP (WITH OR WITHOUT PELVIS) 2-3V*L*
3 series · 3 of 3 positions shown · non-contrast
Comparison: None Available.

CLINICAL DATA: Fall, left hip pain

EXAM:
DG HIP (WITH OR WITHOUT PELVIS) 2-3V LEFT

[x pelvis]
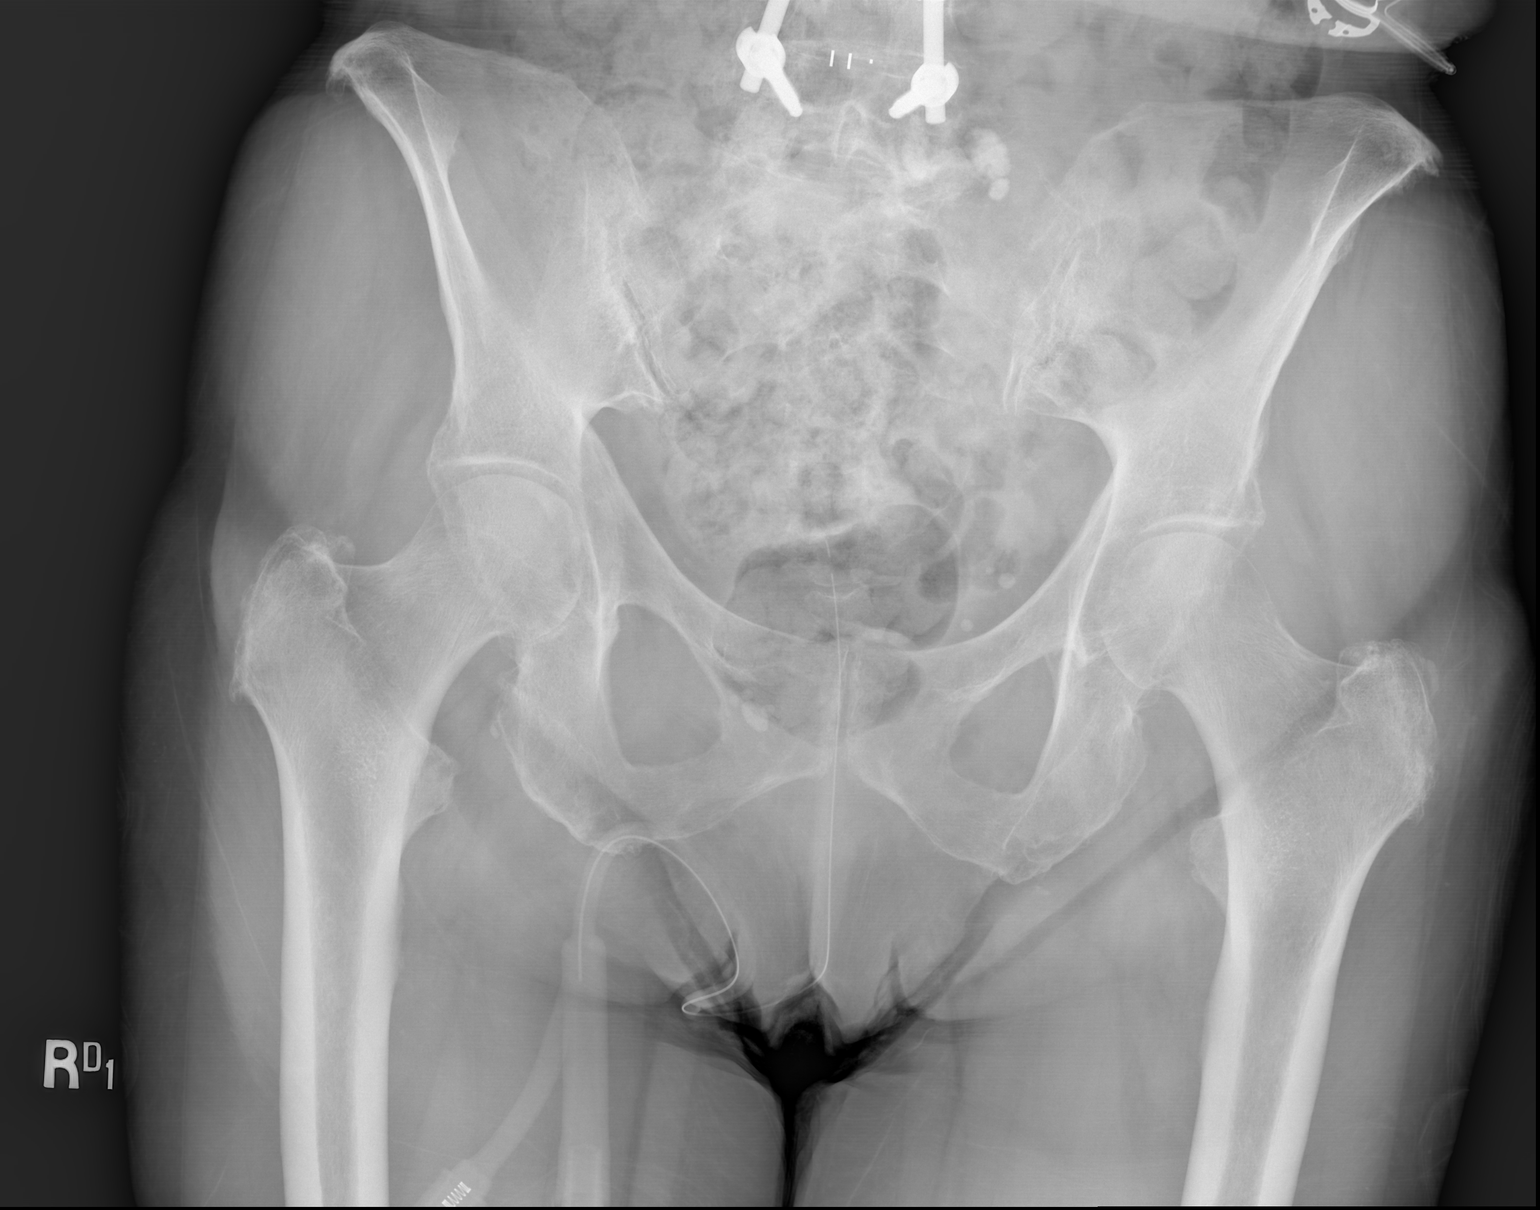

[x hip ap left]
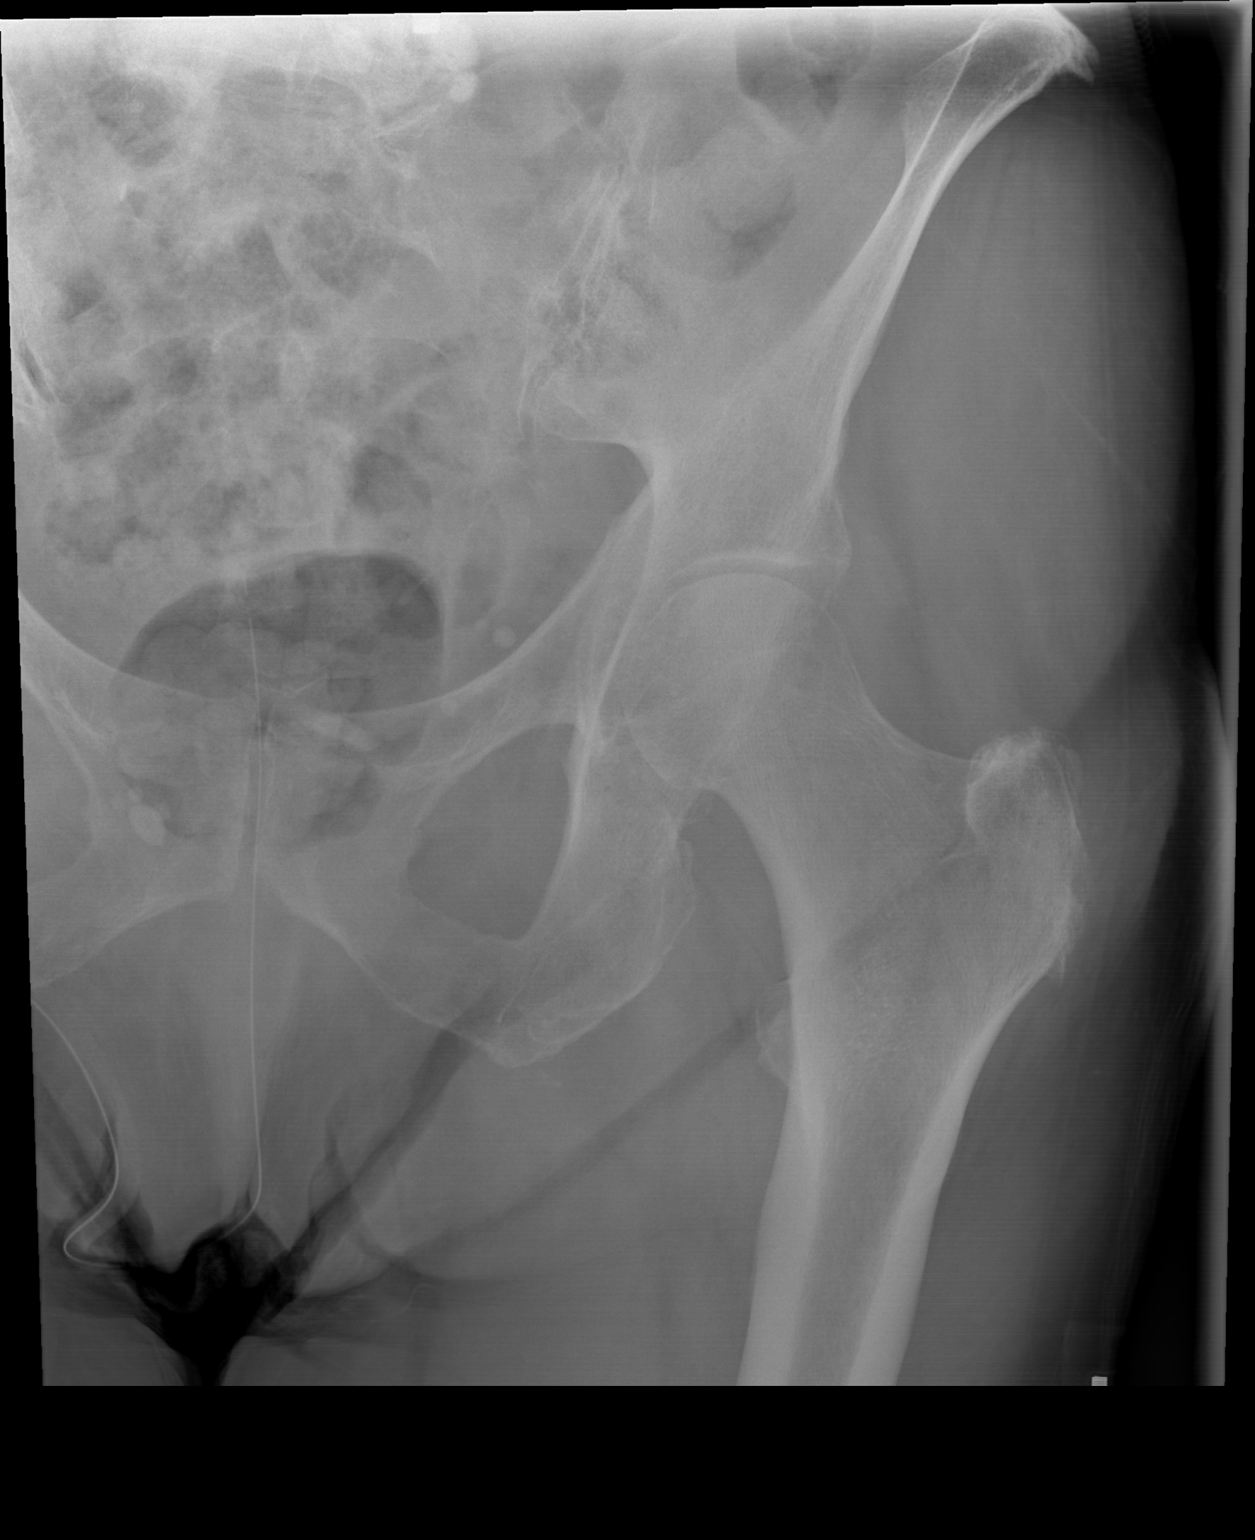

[x hip lat left]
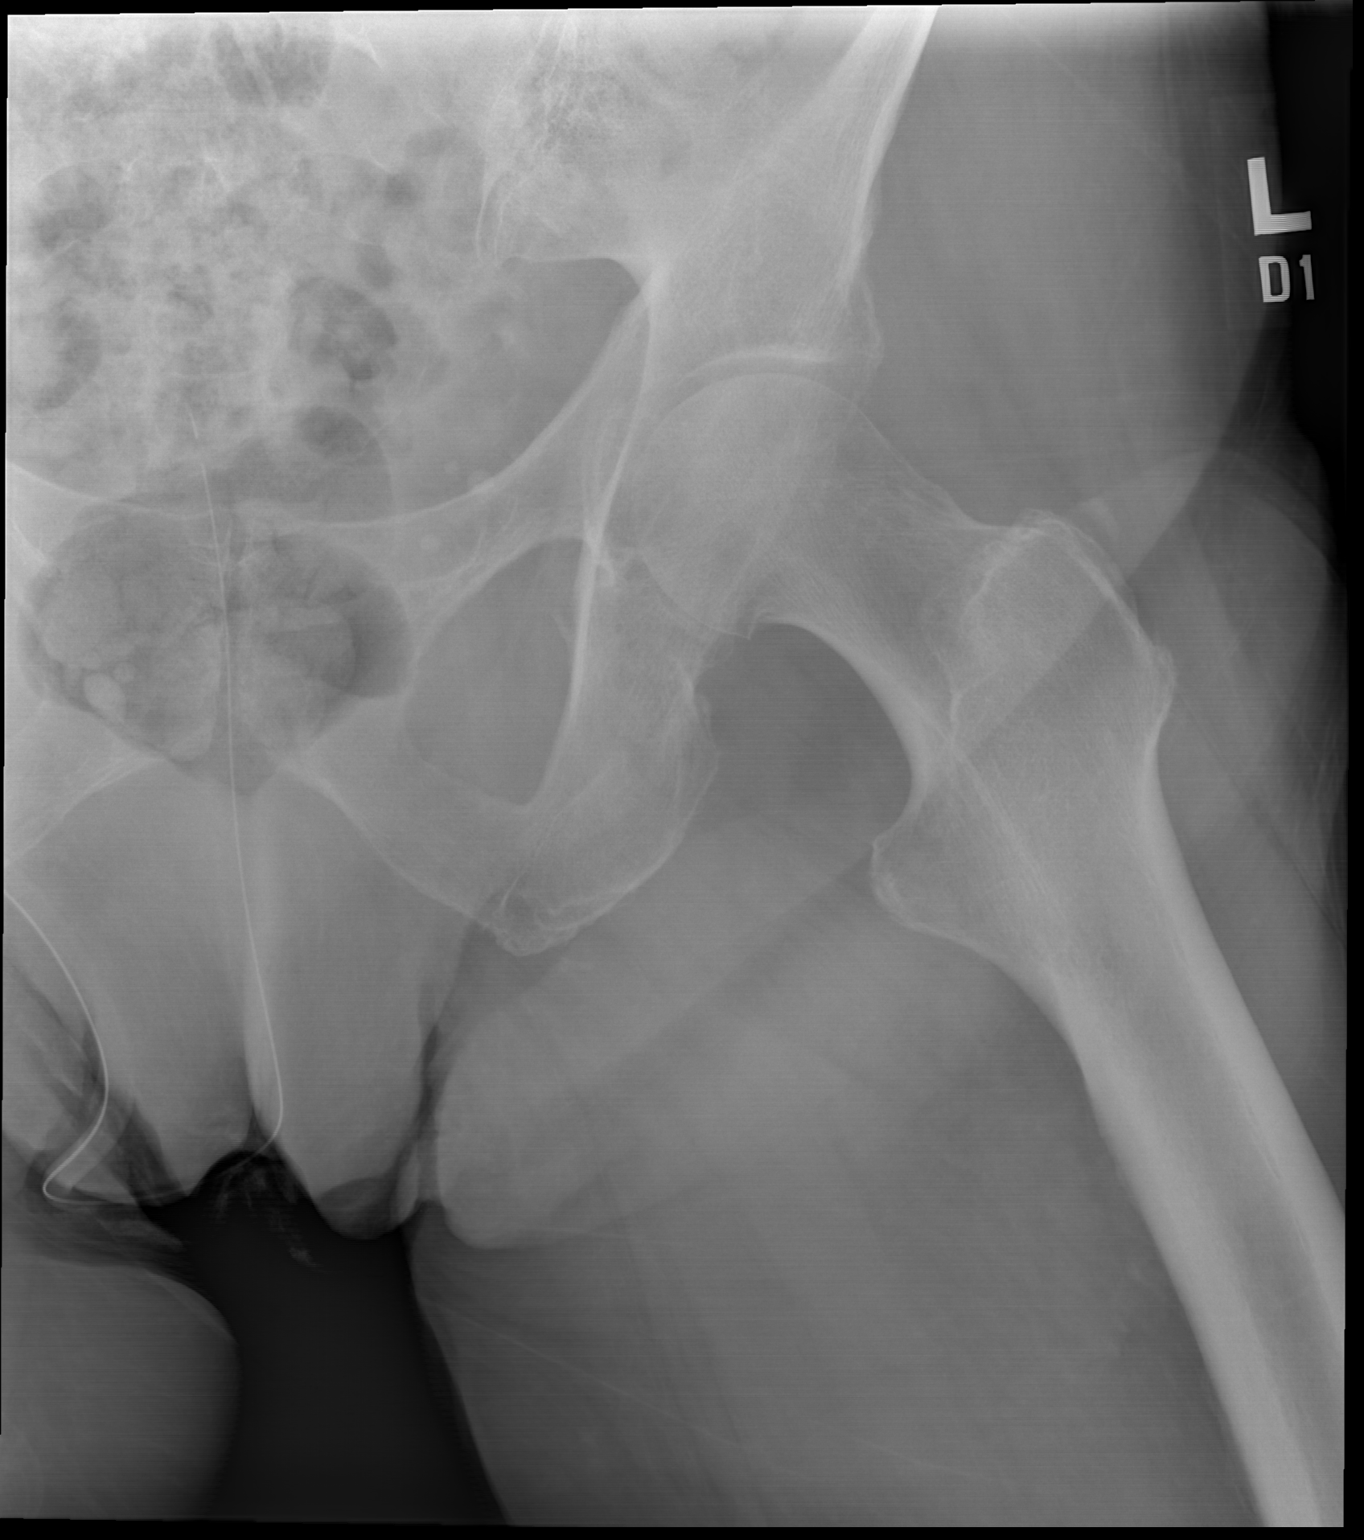

[3 of 3 positions shown; findings below may reference images not displayed]

FINDINGS: Normal alignment. No acute fracture or dislocation. Mild left hip
degenerative arthritis. Lumbar fusion hardware partially visualized.
Foley catheter overlies the expected bladder lumen. Soft tissues are
otherwise unremarkable.
IMPRESSION: No acute abnormality

## 2023-06-12 IMAGING — CT CT L SPINE W/O CM
3 of 5 series · 13 of 33 positions shown, 15 images · non-contrast
Comparison: None Available.

CLINICAL DATA: Low back pain, trauma, fall



[Series 5: sagittal bone · sagittal · 0.40mm/px · 5 of 81 slices shown]
[im 14/81  bone]
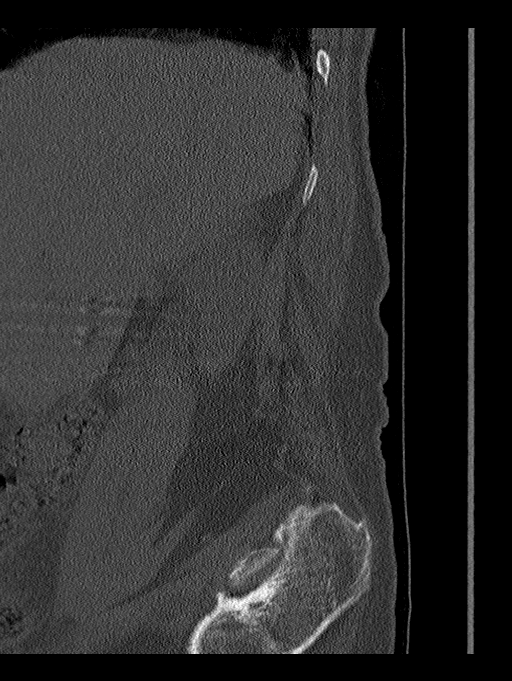
[im 27/81  bone]
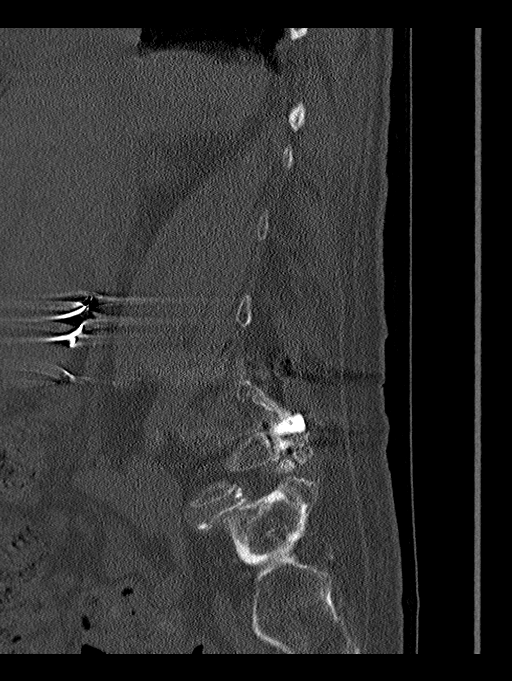
[im 41/81  bone]
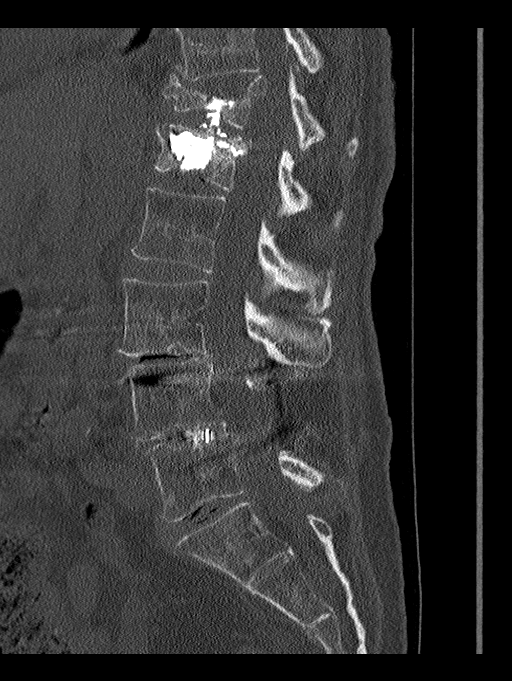
[im 54/81  bone]
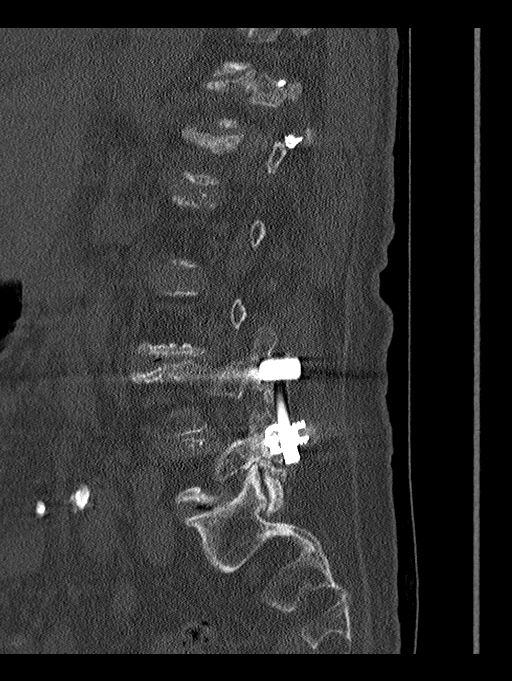
[im 67/81  bone]
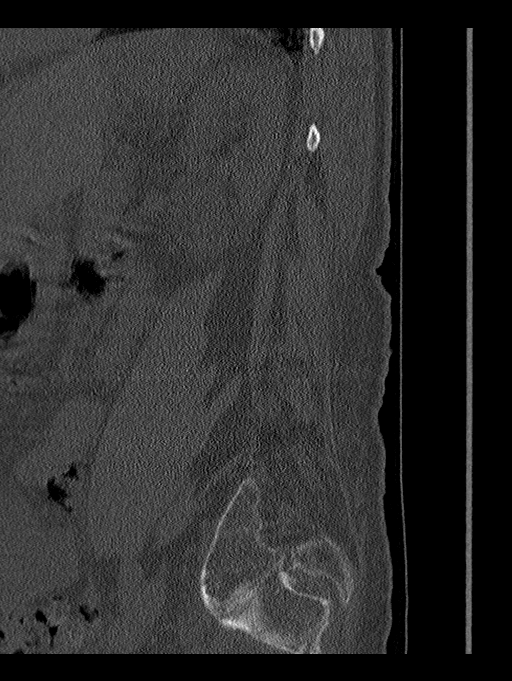

[Series 6: coronal bone · coronal · 0.36mm/px · 3 of 75 slices shown]
[im 15/75  bone]
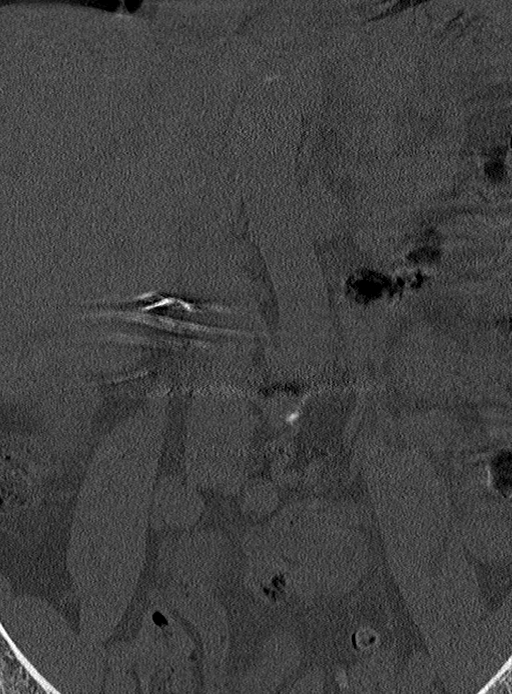
[im 30/75  bone]
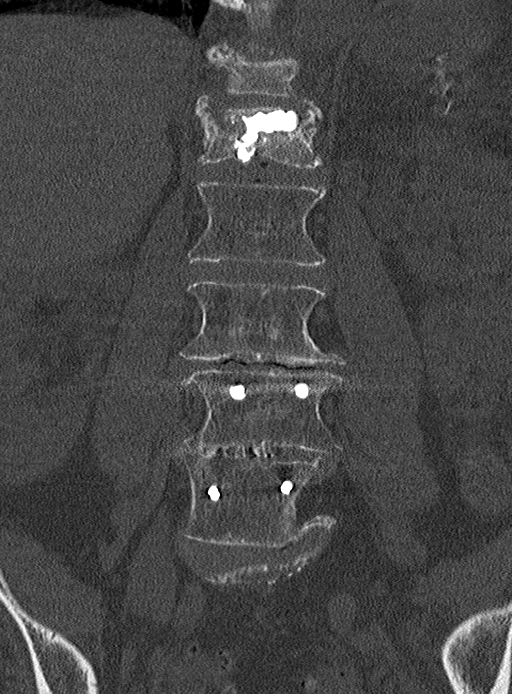
[im 45/75  bone]
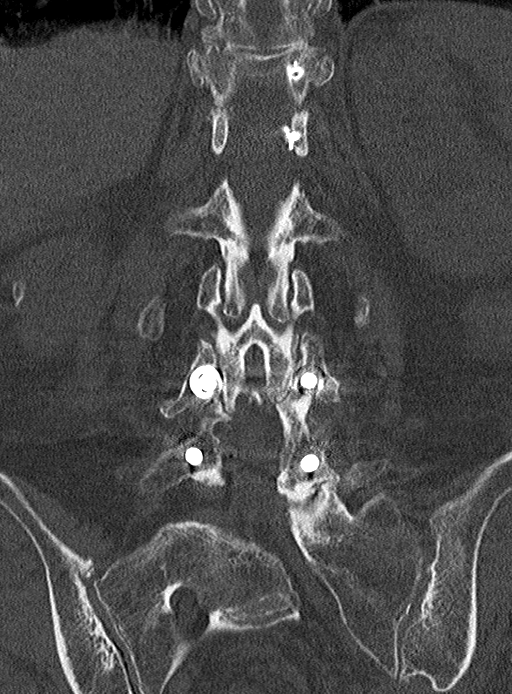

[Series 8: orthongonal axials · axial · 0.31mm/px · z∈[-354,-186]mm · 5 of 129 slices shown, 7 images]
[im 22/129  soft-tissue]
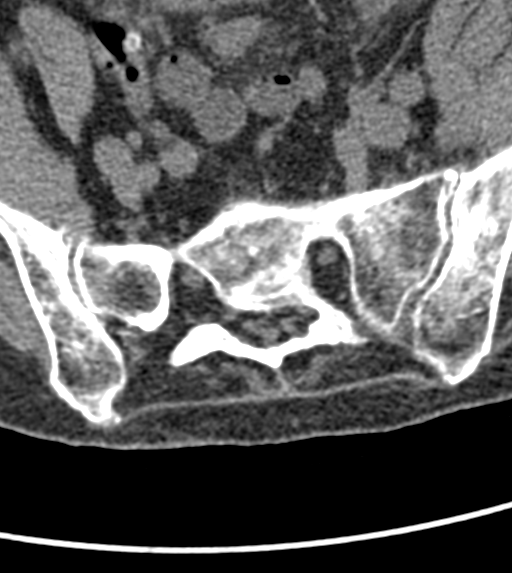
[im 22/129  bone]
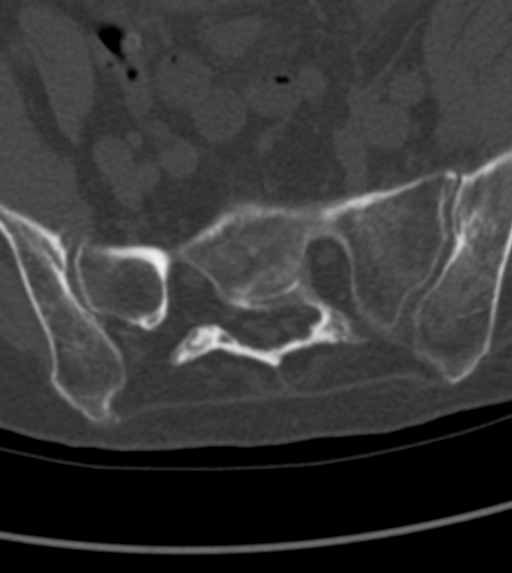
[im 43/129  bone]
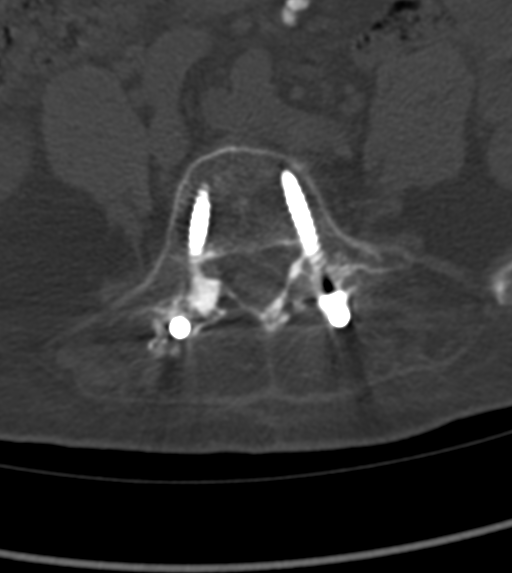
[im 65/129  bone]
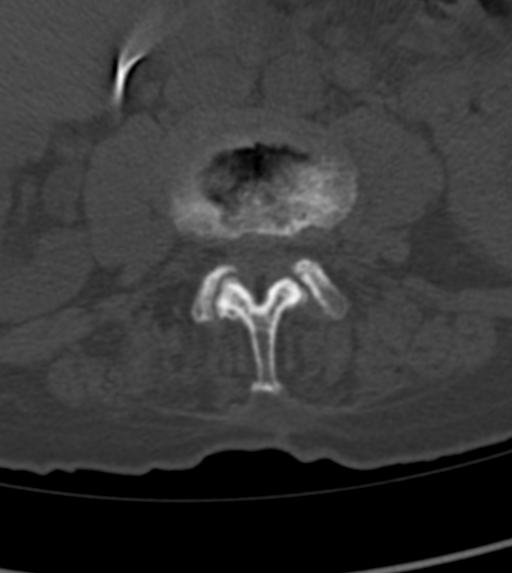
[im 86/129  bone]
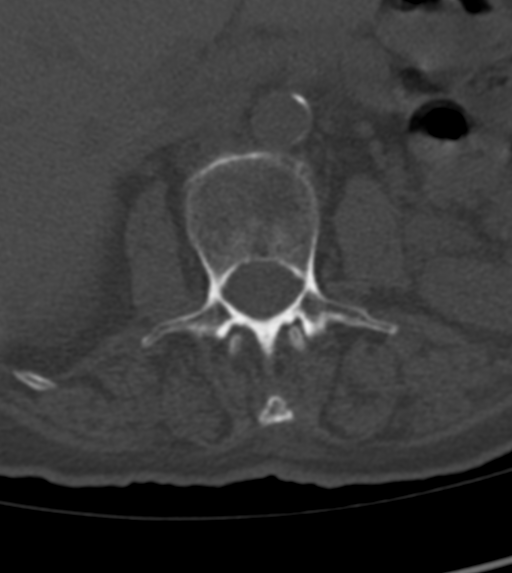
[im 107/129  soft-tissue]
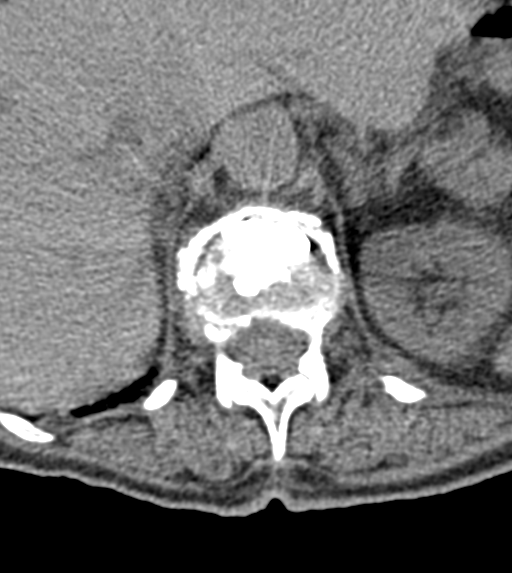
[im 107/129  bone]
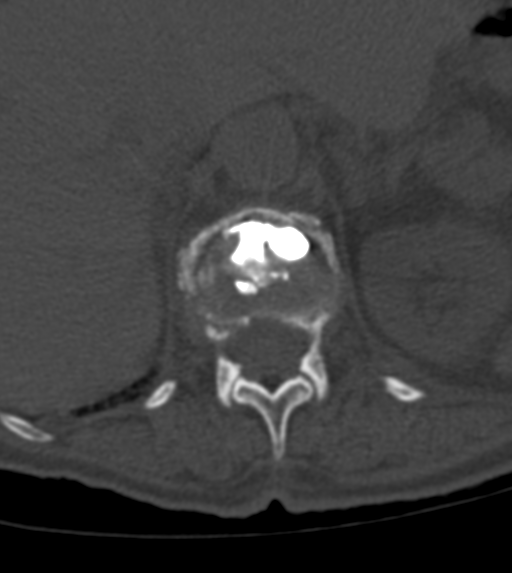

[13 of 33 positions shown; findings below may reference images not displayed]

FINDINGS: Evaluation is somewhat limited by degree of osteopenia segmentation:
5 lumbar type vertebrae.

Alignment: Trace anterolisthesis of T11 on T12, in part secondary to
the T12 compression fracture. Trace anterolisthesis of L3 on L4 and
L4 on L5 which appears facet mediated.

Vertebrae: No definite acute fracture. T12 and L1 chronic appearing
compression fractures status post kyphoplasty. There is
approximately 5 mm of retropulsion of the posterosuperior cortex of
T12 and 4 mm retropulsion of the posterosuperior cortex of L1 L4-L5
posterior decompression and fixation with interbody disc spacer.

Paraspinal and other soft tissues: Aortic atherosclerosis. Prior
cholecystectomy. Diverticulosis without evidence diverticulitis.

Disc levels: Mild spinal canal stenosis at T11-T12 and T12-L1
secondary to retropulsed fracture fragments. Moderate spinal canal
stenosis at L3-L4 secondary to disc bulge, ligamentum flavum
hypertrophy, and facet arthropathy.
IMPRESSION: Evaluation is somewhat limited by degree of osteopenia. Within this
limitation, no definite acute fracture or traumatic listhesis.

## 2023-10-14 ENCOUNTER — Other Ambulatory Visit (HOSPITAL_COMMUNITY): Payer: Self-pay | Admitting: Urology

## 2023-10-14 DIAGNOSIS — R339 Retention of urine, unspecified: Secondary | ICD-10-CM

## 2023-10-22 ENCOUNTER — Other Ambulatory Visit: Payer: Self-pay | Admitting: Student

## 2023-10-22 ENCOUNTER — Other Ambulatory Visit (HOSPITAL_COMMUNITY): Payer: Self-pay | Admitting: Student

## 2023-10-22 DIAGNOSIS — R339 Retention of urine, unspecified: Secondary | ICD-10-CM

## 2023-10-23 ENCOUNTER — Ambulatory Visit (HOSPITAL_COMMUNITY)
Admission: RE | Admit: 2023-10-23 | Discharge: 2023-10-23 | Disposition: A | Discharge status: Home / Self Care | Source: Ambulatory Visit | Attending: Urology | Admitting: Urology

## 2023-10-23 ENCOUNTER — Other Ambulatory Visit: Payer: Self-pay

## 2023-10-23 DIAGNOSIS — R339 Retention of urine, unspecified: Secondary | ICD-10-CM | POA: Diagnosis present

## 2023-10-23 HISTORY — PX: IR CYSTOSTOMY TUBE PLACEMENT/BLADDER ASPIRATION: IMG1097

## 2023-10-23 LAB — BASIC METABOLIC PANEL WITH GFR
Anion gap: 8 (ref 5–15)
BUN: 15 mg/dL (ref 8–23)
CO2: 27 mmol/L (ref 22–32)
Calcium: 8.9 mg/dL (ref 8.9–10.3)
Chloride: 105 mmol/L (ref 98–111)
Creatinine, Ser: 0.88 mg/dL (ref 0.44–1.00)
GFR, Estimated: >60 mL/min (ref >60)
Glucose, Bld: 83 mg/dL (ref 70–99)
Potassium: 4.1 mmol/L (ref 3.5–5.1)
Sodium: 140 mmol/L (ref 135–145)

## 2023-10-23 LAB — CBC
HCT: 36.2 % (ref 36.0–46.0)
Hemoglobin: 11.3 g/dL — ABNORMAL LOW (ref 12.0–15.0)
MCH: 28.5 pg (ref 26.0–34.0)
MCHC: 31.2 g/dL (ref 30.0–36.0)
MCV: 91.4 fL (ref 80.0–100.0)
Platelets: 313 K/uL (ref 150–400)
RBC: 3.96 MIL/uL (ref 3.87–5.11)
RDW: 17 % — ABNORMAL HIGH (ref 11.5–15.5)
WBC: 6.3 K/uL (ref 4.0–10.5)
nRBC: 0 % (ref 0.0–0.2)

## 2023-10-23 LAB — PROTIME-INR
INR: 0.9 (ref 0.8–1.2)
Prothrombin Time: 13 s (ref 11.4–15.2)

## 2023-10-23 MED ORDER — MIDAZOLAM HCL 2 MG/2ML IJ SOLN
INTRAMUSCULAR | Status: AC | PRN
  Administered 2023-10-23: 1 mg via INTRAVENOUS

## 2023-10-23 MED ORDER — MIDAZOLAM HCL 2 MG/2ML IJ SOLN
INTRAMUSCULAR | Status: AC
  Filled 2023-10-23: qty 2

## 2023-10-23 MED ORDER — LIDOCAINE HCL (PF) 1 % IJ SOLN
20.0000 mL | Freq: Once | INTRAMUSCULAR | Status: AC
  Administered 2023-10-23: 10 mL

## 2023-10-23 MED ORDER — SODIUM CHLORIDE 0.9% FLUSH: 5.0000 mL | Freq: Three times a day (TID) | INTRAVENOUS | Status: DC

## 2023-10-23 MED ORDER — FENTANYL CITRATE (PF) 100 MCG/2ML IJ SOLN
INTRAMUSCULAR | Status: AC | PRN
  Administered 2023-10-23: 25 ug via INTRAVENOUS

## 2023-10-23 MED ORDER — LIDOCAINE HCL 1 % IJ SOLN
INTRAMUSCULAR | Status: AC
  Filled 2023-10-23: qty 20

## 2023-10-23 MED ORDER — FENTANYL CITRATE (PF) 100 MCG/2ML IJ SOLN
INTRAMUSCULAR | Status: AC
  Filled 2023-10-23: qty 2

## 2023-10-23 MED ORDER — SODIUM CHLORIDE 0.9 % IV SOLN: INTRAVENOUS | Status: DC

## 2023-10-23 MED ORDER — IOHEXOL 300 MG/ML  SOLN
50.0000 mL | Freq: Once | INTRAMUSCULAR | Status: AC | PRN
  Administered 2023-10-23: 20 mL

## 2023-10-23 NOTE — Progress Notes (Signed)
 Daughter in with client

## 2023-10-23 NOTE — H&P (Signed)
 Chief Complaint: Areflexic bladder  Referring Provider(s): Pace,Maryellen D   Supervising Physician: Johann Sieving  Patient Status: [facility] - Out-pt  History of Present Illness: Marie Mitchell is a 89 y.o. female who is a resident at [facility]. She has a history of osteoporosis, frequent falls, stroke, dementia, bladder areflexia with chronic foley use.   IR consulted for SP cath. She is followed by Dr. Elisabeth with urology.  Confirms NPO since MN and ride/supervision available for 24 hours.  Does not wear CPAP or use supplemental home O2.  Her daughter who is her POA is at bedside for discussion and exam.   ROS limited by cognition. No reports of behavior change, blood in foley, N/V, SOB, abd pain at facility. Patient has had some trouble lying completely flat for foley changes at facility, and patient removing her foley has been a concern.    Allergies Reviewed:  Bacitracin   Patient is Full Code. She has had a DNR at facility but family did not bring as she would like rescinded for procedure today.  No past medical history on file.  No past surgical history on file.    Medications: Prior to Admission medications   Medication Sig Start Date End Date Taking? Authorizing Provider  acetaminophen  (TYLENOL ) 500 MG tablet Take 500 mg by mouth every 6 (six) hours as needed (for pain).    [provider]  amantadine  (SYMMETREL ) 100 MG capsule Take 1 capsule (100 mg total) by mouth daily. 08/28/21   Love, Sharlet RAMAN, PA-C  aspirin  EC 81 MG tablet Take 1 tablet (81 mg total) by mouth daily. Swallow whole. 08/18/21   Bryn Bernardino NOVAK, MD  atorvastatin  (LIPITOR) 40 MG tablet Take 1 tablet (40 mg total) by mouth daily. 08/18/21   Bryn Bernardino NOVAK, MD  calcium  carbonate (OSCAL) 1500 (600 Ca) MG TABS tablet Take 600-1,200 mg by mouth See admin instructions. Give 2 tablets by mouth in the morning then give 1 tablet every evening    [provider]  Cholecalciferol  25 MCG  (1000 UT) tablet Take 1,000 Units by mouth at bedtime.    [provider]  FLUoxetine  (PROZAC ) 20 MG tablet Take 1.5 tablets (30 mg total) by mouth daily. 08/27/21   Love, Sharlet RAMAN, PA-C  polyethylene glycol (MIRALAX  / GLYCOLAX ) 17 g packet Take 17 g by mouth daily. 08/27/21   Love, Sharlet RAMAN, PA-C  senna-docusate (SENOKOT-S) 8.6-50 MG tablet Take 2 tablets by mouth daily after supper. 08/27/21   Love, Sharlet RAMAN, PA-C  sodium chloride  (OCEAN) 0.65 % SOLN nasal spray Place 1 spray into both nostrils as needed for congestion. 08/27/21   Love, Sharlet RAMAN, PA-C  traMADol  (ULTRAM ) 50 MG tablet Take 1 tablet (50 mg total) by mouth 2 (two) times daily as needed for severe pain. 08/27/21   Love, Sharlet RAMAN, PA-C  traZODone  (DESYREL ) 50 MG tablet Take 0.5 tablets (25 mg total) by mouth at bedtime. 08/27/21   Love, Sharlet RAMAN, PA-C  vitamin B-12 (CYANOCOBALAMIN) 1000 MCG tablet Take 1,000 mcg by mouth daily.    [provider]     No family history on file.  Social History   Socioeconomic History   Marital status: Widowed    Spouse name: Not on file   Number of children: Not on file   Years of education: Not on file   Highest education level: Not on file  Occupational History   Not on file  Tobacco Use   Smoking status: Never  Smokeless tobacco: Never  Vaping Use   Vaping status: Never Used  Substance and Sexual Activity   Alcohol use: Yes    Comment: occasional wine   Drug use: Never   Sexual activity: Not Currently  Other Topics Concern   Not on file  Social History Narrative   Not on file   Social Drivers of Health   Financial Resource Strain: Low Risk  (06/20/2021)   Received from [facility]   Overall Financial Resource Strain (CARDIA)    Difficulty of Paying Living Expenses: Not very hard  Food Insecurity: No Food Insecurity (06/20/2021)   Received from [facility]   Hunger Vital Sign    Within the past 12 months, you worried that your food would run out before you got the  money to buy more.: Never true    Within the past 12 months, the food you bought just didn't last and you didn't have money to get more.: Never true  Transportation Needs: No Transportation Needs (05/11/2021)   Received from [facility] - Transportation    Lack of Transportation (Medical): No    Lack of Transportation (Non-Medical): No  Physical Activity: Insufficiently Active (06/20/2021)   Received from [facility]   Exercise Vital Sign    On average, how many days per week do you engage in moderate to strenuous exercise (like a brisk walk)?: 3 days    On average, how many minutes do you engage in exercise at this level?: 20 min  Stress: Stress Concern Present (06/20/2021)   Received from [facility] - Occupational Stress Questionnaire    Feeling of Stress : Rather much  Social Connections: Unknown (07/06/2022)   Received from [facility]   Social Network    Social Network: Not on file     Review of Systems: A 12 point ROS discussed and pertinent positives are indicated in the HPI above.  All other systems are negative.   Vital Signs: BP 131/78   Pulse 68   Temp (!) 97.5 F (36.4 C) (Oral)   Resp 16   Ht 5' 7 (1.702 m)   Wt 130 lb (59 kg)   SpO2 94%   BMI 20.36 kg/m   Advance Care Plan: No documents on file    Physical Exam Constitutional:      Comments: Kyphotic posture  HENT:     Mouth/Throat:     Mouth: Mucous membranes are moist.     Pharynx: Oropharynx is clear.     Comments: Patient is slow to respond to directions, especially to open her mouth.  Cardiovascular:     Rate and Rhythm: Normal rate and regular rhythm.     Pulses: Normal pulses.     Heart sounds: Normal heart sounds.  Pulmonary:     Effort: Pulmonary effort is normal.     Breath sounds: Normal breath sounds.  Abdominal:     General: There is no distension.     Palpations: Abdomen is soft.     Tenderness: There is no abdominal  tenderness.  Genitourinary:    Comments: Foley present, clamped, hazy yellow urine in collection, sediment in tubing Skin:    General: Skin is warm and dry.  Neurological:     Mental Status: She is alert. Mental status is at baseline. She is disoriented.  Psychiatric:        Mood and Affect: Mood normal.     Imaging: No results found.  Labs:  CBC: Recent Labs    10/23/23 0940  WBC 6.3  HGB 11.3*  HCT 36.2  PLT 313    COAGS: No results for input(s): INR, APTT in the last 8760 hours.  BMP: No results for input(s): NA, K, CL, CO2, GLUCOSE, BUN, CALCIUM , CREATININE, GFRNONAA, GFRAA in the last 8760 hours.  Invalid input(s): CMP  LIVER FUNCTION TESTS: No results for input(s): BILITOT, AST, ALT, ALKPHOS, PROT, ALBUMIN in the last 8760 hours.  TUMOR MARKERS: No results for input(s): AFPTM, CEA, CA199, CHROMGRNA in the last 8760 hours.  Assessment and Plan:  Request for  image guided suprapubic catheter placement approved for 8/1 with Dr. Johann No contraindications for procedure identified in ROS, physical exam, or review of pre-sedation considerations. 8/1 CBC reviewed and within acceptable range. INR/BMP pending, to be reviewed prior to procedure start VSS, afebrile ASA held. Last dose 5 days ago confirmed with [facility] by short stay RN.  Abx not indicated Daughter aware that technique used to insert SP will dictate whether 1st exchange will happen in IR or can occur at urology office.   Risks and benefits of suprapubic catheter placement were discussed with the patient and patient's daughter/POA Gerard) including bleeding, infection, damage to adjacent structures, bladder perforation/fistula connection, and sepsis.  All of the patient's questions were answered, patient is agreeable to proceed. Consent signed and in chart.   Thank you for allowing our service to participate in Peri Kreft 's care.     Electronically Signed: Laymon Coast, NP   10/23/2023, 10:23 AM     I spent a total of  15 Minutes   in face to face in clinical consultation, greater than 50% of which was counseling/coordinating care for SP tube insertion.    (A copy of this note was sent to the referring provider and the time of visit.)

## 2023-10-23 NOTE — Procedures (Signed)
  Procedure:  US  and fluoro guided suprapubic catheter placement 14F Council tip Preprocedure diagnosis: The encounter diagnosis was Urinary retention. Postprocedure diagnosis: same EBL:    minimal Complications:   none immediate  See full dictation in [employer].  CHARM Toribio Faes MD Main # [phone] Pager  [phone] Mobile [phone]

## 2023-10-25 ENCOUNTER — Other Ambulatory Visit: Payer: Self-pay | Admitting: Student

## 2023-10-25 ENCOUNTER — Other Ambulatory Visit: Payer: Self-pay

## 2023-10-25 ENCOUNTER — Emergency Department (HOSPITAL_COMMUNITY): Admission: EM | Admit: 2023-10-25 | Discharge: 2023-10-25 | Disposition: A | Discharge status: Home / Self Care

## 2023-10-25 ENCOUNTER — Encounter (HOSPITAL_COMMUNITY): Payer: Self-pay | Admitting: Student

## 2023-10-25 ENCOUNTER — Emergency Department (HOSPITAL_COMMUNITY)

## 2023-10-25 DIAGNOSIS — Z7982 Long term (current) use of aspirin: Secondary | ICD-10-CM | POA: Insufficient documentation

## 2023-10-25 DIAGNOSIS — Y828 Other medical devices associated with adverse incidents: Secondary | ICD-10-CM | POA: Diagnosis not present

## 2023-10-25 DIAGNOSIS — R339 Retention of urine, unspecified: Secondary | ICD-10-CM

## 2023-10-25 DIAGNOSIS — T83091A Other mechanical complication of indwelling urethral catheter, initial encounter: Secondary | ICD-10-CM | POA: Diagnosis present

## 2023-10-25 DIAGNOSIS — T83010A Breakdown (mechanical) of cystostomy catheter, initial encounter: Secondary | ICD-10-CM

## 2023-10-25 HISTORY — PX: IR CYSTOSTOMY TUBE CHANGE COMPLICATED W IMG: IMG1098

## 2023-10-25 LAB — CBC WITH DIFFERENTIAL/PLATELET
Abs Immature Granulocytes: 0.01 K/uL (ref 0.00–0.07)
Basophils Absolute: 0 K/uL (ref 0.0–0.1)
Basophils Relative: 0 %
Eosinophils Absolute: 0.1 K/uL (ref 0.0–0.5)
Eosinophils Relative: 2 %
HCT: 33.4 % — ABNORMAL LOW (ref 36.0–46.0)
Hemoglobin: 10.5 g/dL — ABNORMAL LOW (ref 12.0–15.0)
Immature Granulocytes: 0 %
Lymphocytes Relative: 6 %
Lymphs Abs: 0.4 K/uL — ABNORMAL LOW (ref 0.7–4.0)
MCH: 28.8 pg (ref 26.0–34.0)
MCHC: 31.4 g/dL (ref 30.0–36.0)
MCV: 91.8 fL (ref 80.0–100.0)
Monocytes Absolute: 0.7 K/uL (ref 0.1–1.0)
Monocytes Relative: 9 %
Neutro Abs: 6.5 K/uL (ref 1.7–7.7)
Neutrophils Relative %: 83 %
Platelets: 284 K/uL (ref 150–400)
RBC: 3.64 MIL/uL — ABNORMAL LOW (ref 3.87–5.11)
RDW: 17 % — ABNORMAL HIGH (ref 11.5–15.5)
WBC: 7.8 K/uL (ref 4.0–10.5)
nRBC: 0 % (ref 0.0–0.2)

## 2023-10-25 LAB — COMPREHENSIVE METABOLIC PANEL WITH GFR
ALT: 40 U/L (ref 0–44)
AST: 31 U/L (ref 15–41)
Albumin: 2.9 g/dL — ABNORMAL LOW (ref 3.5–5.0)
Alkaline Phosphatase: 73 U/L (ref 38–126)
Anion gap: 8 (ref 5–15)
BUN: 20 mg/dL (ref 8–23)
CO2: 25 mmol/L (ref 22–32)
Calcium: 8.9 mg/dL (ref 8.9–10.3)
Chloride: 106 mmol/L (ref 98–111)
Creatinine, Ser: 0.85 mg/dL (ref 0.44–1.00)
GFR, Estimated: >60 mL/min (ref >60)
Glucose, Bld: 93 mg/dL (ref 70–99)
Potassium: 3.8 mmol/L (ref 3.5–5.1)
Sodium: 139 mmol/L (ref 135–145)
Total Bilirubin: 0.7 mg/dL (ref 0.0–1.2)
Total Protein: 6.2 g/dL — ABNORMAL LOW (ref 6.5–8.1)

## 2023-10-25 MED ORDER — DIPHENHYDRAMINE HCL 50 MG/ML IJ SOLN
INTRAMUSCULAR | Status: AC
  Filled 2023-10-25: qty 1

## 2023-10-25 MED ORDER — IOHEXOL 300 MG/ML  SOLN
50.0000 mL | Freq: Once | INTRAMUSCULAR | Status: AC | PRN
  Administered 2023-10-25: 50 mL

## 2023-10-25 MED ORDER — LIDOCAINE VISCOUS HCL 2 % MT SOLN
OROMUCOSAL | Status: AC
  Filled 2023-10-25: qty 15

## 2023-10-25 MED ORDER — DIPHENHYDRAMINE HCL 50 MG/ML IJ SOLN
INTRAMUSCULAR | Status: AC | PRN
Start: 2023-10-25 — End: 2023-10-25
  Administered 2023-10-25: 12.5 mg via INTRAVENOUS

## 2023-10-25 MED ORDER — FENTANYL CITRATE (PF) 100 MCG/2ML IJ SOLN
INTRAMUSCULAR | Status: AC | PRN
  Administered 2023-10-25 (×2): 25 ug via INTRAVENOUS
  Administered 2023-10-25: 50 ug via INTRAVENOUS

## 2023-10-25 MED ORDER — LIDOCAINE VISCOUS HCL 2 % MT SOLN
15.0000 mL | Freq: Once | OROMUCOSAL | Status: AC
  Administered 2023-10-25: 7 mL via OROMUCOSAL

## 2023-10-25 MED ORDER — FENTANYL CITRATE (PF) 100 MCG/2ML IJ SOLN
INTRAMUSCULAR | Status: AC
Start: 2023-10-25 — End: 2023-10-25
  Filled 2023-10-25: qty 2

## 2023-10-25 NOTE — Discharge Instructions (Addendum)
 The schedulers will contact you during the outpatient scheduling of your suprapubic replacement.  Please continue with the regular Foley care at this moment of time.

## 2023-10-25 NOTE — Consult Note (Signed)
 Chief Complaint: Patient was seen in consultation today for dislodged [facility] Chief Complaint  Patient presents with   SUPRAPUBIC CATH    pulled   at the request of Simon Rea   Referring Physician(s): Simon Rea   Supervising Physician: Jenna Hacker  Patient Status: [facility] - ED  History of Present Illness: Marie Mitchell is a 89 y.o. female with PMHs of steoporosis, frequent falls, stroke, dementia, bladder areflexia s/p SPC placement by Dr. Johann on 8/1 presents with dislodged SPC.   Patient was sent to ED from [facility] this morning after it was found that she pulled the [facility].  IR was consulted for replacement, case reviewed and approved by Dr. Jenna.   Patient seen in ED, she is sitting in bed NAD, does not answer questions or follows commands.   RN and [facility] burn staff does not know if patient has breakfast today. Will attempt replacement with one drug only.     History reviewed. No pertinent past medical history.  Past Surgical History:  Procedure Laterality Date   IR CYSTOSTOMY TUBE PLACEMENT/BLADDER ASPIRATION  10/23/2023    Allergies: Bacitracin  Medications: Prior to Admission medications   Medication Sig Start Date End Date Taking? Authorizing Provider  acetaminophen  (TYLENOL ) 500 MG tablet Take 500 mg by mouth every 6 (six) hours as needed (for pain).    [provider]  amantadine  (SYMMETREL ) 100 MG capsule Take 1 capsule (100 mg total) by mouth daily. 08/28/21   Love, Sharlet RAMAN, PA-C  aspirin  EC 81 MG tablet Take 1 tablet (81 mg total) by mouth daily. Swallow whole. 08/18/21   Bryn Bernardino NOVAK, MD  atorvastatin  (LIPITOR) 40 MG tablet Take 1 tablet (40 mg total) by mouth daily. 08/18/21   Bryn Bernardino NOVAK, MD  calcium  carbonate (OSCAL) 1500 (600 Ca) MG TABS tablet Take 600-1,200 mg by mouth See admin instructions. Give 2 tablets by mouth in the morning then give 1 tablet every evening    [provider]  Cholecalciferol  25 MCG (1000 UT) tablet  Take 1,000 Units by mouth at bedtime.    [provider]  FLUoxetine  (PROZAC ) 20 MG tablet Take 1.5 tablets (30 mg total) by mouth daily. 08/27/21   Love, Sharlet RAMAN, PA-C  polyethylene glycol (MIRALAX  / GLYCOLAX ) 17 g packet Take 17 g by mouth daily. 08/27/21   Love, Sharlet RAMAN, PA-C  senna-docusate (SENOKOT-S) 8.6-50 MG tablet Take 2 tablets by mouth daily after supper. 08/27/21   Love, Sharlet RAMAN, PA-C  sodium chloride  (OCEAN) 0.65 % SOLN nasal spray Place 1 spray into both nostrils as needed for congestion. 08/27/21   Love, Sharlet RAMAN, PA-C  traMADol  (ULTRAM ) 50 MG tablet Take 1 tablet (50 mg total) by mouth 2 (two) times daily as needed for severe pain. 08/27/21   Love, Sharlet RAMAN, PA-C  traZODone  (DESYREL ) 50 MG tablet Take 0.5 tablets (25 mg total) by mouth at bedtime. 08/27/21   Love, Sharlet RAMAN, PA-C  vitamin B-12 (CYANOCOBALAMIN) 1000 MCG tablet Take 1,000 mcg by mouth daily.    [provider]     History reviewed. No pertinent family history.  Social History   Socioeconomic History   Marital status: Widowed    Spouse name: Not on file   Number of children: Not on file   Years of education: Not on file   Highest education level: Not on file  Occupational History   Not on file  Tobacco Use   Smoking status: Never   Smokeless tobacco:  Never  Vaping Use   Vaping status: Never Used  Substance and Sexual Activity   Alcohol use: Yes    Comment: occasional wine   Drug use: Never   Sexual activity: Not Currently  Other Topics Concern   Not on file  Social History Narrative   Not on file   Social Drivers of Health   Financial Resource Strain: Low Risk  (06/20/2021)   Received from [facility]   Overall Financial Resource Strain (CARDIA)    Difficulty of Paying Living Expenses: Not very hard  Food Insecurity: No Food Insecurity (06/20/2021)   Received from [facility]   Hunger Vital Sign    Within the past 12 months, you worried that your food would run out before you got  the money to buy more.: Never true    Within the past 12 months, the food you bought just didn't last and you didn't have money to get more.: Never true  Transportation Needs: No Transportation Needs (05/11/2021)   Received from [facility] - Transportation    Lack of Transportation (Medical): No    Lack of Transportation (Non-Medical): No  Physical Activity: Insufficiently Active (06/20/2021)   Received from [facility]   Exercise Vital Sign    On average, how many days per week do you engage in moderate to strenuous exercise (like a brisk walk)?: 3 days    On average, how many minutes do you engage in exercise at this level?: 20 min  Stress: Stress Concern Present (06/20/2021)   Received from [facility] - Occupational Stress Questionnaire    Feeling of Stress : Rather much  Social Connections: Unknown (07/06/2022)   Received from [facility]   Social Network    Social Network: Not on file     Review of Systems: A 12 point ROS discussed and pertinent positives are indicated in the HPI above.  All other systems are negative.  Vital Signs: BP (!) 125/92 (BP Location: Left Arm)   Pulse 79   Temp 98.9 F (37.2 C) (Axillary)   Ht 5' 7 (1.702 m)   Wt 130 lb 1.1 oz (59 kg)   SpO2 96%   BMI 20.37 kg/m    Physical Exam Vitals reviewed.  Constitutional:      General: She is not in acute distress.    Appearance: She is not ill-appearing.  HENT:     Head: Normocephalic and atraumatic.     Mouth/Throat:     Mouth: Mucous membranes are moist.     Pharynx: Oropharynx is clear.  Cardiovascular:     Rate and Rhythm: Normal rate and regular rhythm.     Heart sounds: Normal heart sounds.  Pulmonary:     Effort: Pulmonary effort is normal.     Breath sounds: Normal breath sounds.  Abdominal:     General: Abdomen is flat. Bowel sounds are normal.     Palpations: Abdomen is soft.     Comments: Small open wound in the  suprapubic lesion from the previous SPC tube. Site is clean and dry.   Musculoskeletal:     Cervical back: Neck supple.  Skin:    General: Skin is warm and dry.     Coloration: Skin is not jaundiced or pale.  Neurological:     Mental Status: She is alert.     MD Evaluation Airway: WNL Heart: WNL Abdomen: WNL Chest/ Lungs: WNL ASA  Classification: 3 Mallampati/Airway Score:  Two  Imaging: IR CYSTOSTOMY TUBE PLACEMENT/BLADDER ASPIRATION Result Date: 10/23/2023 INDICATION: Areflexive urinary bladder, Foley catheter dependent EXAM: FLUOROSCOPIC GUIDED SUPRAPUBIC CATHETER PLACEMENT COMPARISON:  None MEDICATIONS: Lidocaine  1% subcutaneous ANESTHESIA/SEDATION: Intravenous Fentanyl  25mcg and Versed  1mg  were administered by RN during a total moderate (conscious) sedation time of 10 minutes; the patient's level of consciousness and physiological / cardiorespiratory status were monitored continuously by radiology RN under my direct supervision. CONTRAST:  20 mL Omnipaque  180 into the urinary bladder FLUOROSCOPY: Radiation Exposure Index (as provided by the fluoroscopic device): 8 MGy air Kerma COMPLICATIONS: None immediate. PROCEDURE: The procedure, risks, benefits, and alternatives were explained to the patient. Questions regarding the procedure were encouraged and answered. The patient understands and consents to the procedure. A timeout was performed prior to the initiation of the procedure. Ultrasound performed to confirm appropriate suprapubic approach. The patient was positioned supine. Pre-procedural imaging was obtained of the lower pelvis. The skin overlying the anterior aspect the lower pelvis was prepped and draped in usual sterile fashion. The overlying soft tissues were anesthetized with 1% lidocaine . Next, an 18 gauge needle was advanced into the urinary bladder under direct fluoroscopic guidance. Ultrasound image was saved for procedural documentation purposes. A short Amplatz wire was coiled  within the urinary bladder. Appropriate position was confirmed with a fluoroscopy. Under direct fluoroscopic guidance, a 8 x 40 mm balloon was advanced over the wire and insufflated to effacement. The balloon was released, allowing placement of a 16 Fr Council-tip suprapubic cystostomy drainage catheter with end locked within the urinary bladder. The catheter was connected to a gravity bag. The exit site of the catheter was secured within interrupted suture. A dressing was placed. The patient tolerated the procedure well without immediate postprocedural complication. FINDINGS: * fluoroscopy was utilized for the placement of a 16 Fr Council-tip percutaneous suprapubic cystostomy catheter with end coiled and locked within the urinary bladder. IMPRESSION: Successful ultrasound and fluoroscopic-guided placement of a 16 Fr Council-tip suprapubic catheter. PLAN: Routine suprapubic catheter exchanges by the Urology service. Electronically Signed   By: JONETTA Faes M.D.   On: 10/23/2023 12:51    Labs:  CBC: Recent Labs    10/23/23 0940 10/25/23 1025  WBC 6.3 7.8  HGB 11.3* 10.5*  HCT 36.2 33.4*  PLT 313 284    COAGS: Recent Labs    10/23/23 0940  INR 0.9    BMP: Recent Labs    10/23/23 0940  NA 140  K 4.1  CL 105  CO2 27  GLUCOSE 83  BUN 15  CALCIUM  8.9  CREATININE 0.88  GFRNONAA >60    LIVER FUNCTION TESTS: No results for input(s): BILITOT, AST, ALT, ALKPHOS, PROT, ALBUMIN in the last 8760 hours.  TUMOR MARKERS: No results for input(s): AFPTM, CEA, CA199, CHROMGRNA in the last 8760 hours.  Assessment and Plan: 89 y.o. female with urinary retention s/p SPC placement on 8/1 presents with dislodged SPC.   Unclear of NPO status  Labs from 8/1 w/n acceptable limits  VSS Allergies reviewed  Risks and benefits of suprapubic catheter placement were discussed with the patient's daughter  including bleeding, infection, damage to adjacent structures, bladder  perforation/fistula connection, and sepsis.  All of the daughter's questions were answered, daughter is agreeable to proceed. Consent signed and in IR.   Will attempt replacement today. If patient cannot tolerate the procedure with one drug, she will be rescheduled for tomorrow.     Thank you for this interesting consult.  I greatly enjoyed meeting [address]  [place] and look forward to participating in their care.  A copy of this report was sent to the requesting provider on this date.  Electronically Signed: Toya VEAR Cousin, PA-C 10/25/2023, 10:51 AM   I spent a total of 20 Minutes    in face to face in clinical consultation, greater than 50% of which was counseling/coordinating care for [facility] replacement.  This chart was dictated using voice recognition software.  Despite best efforts to proofread,  errors can occur which can change the documentation meaning.

## 2023-10-25 NOTE — Procedures (Signed)
 Pre procedural Dx: Accidental removal of newly placed suprapubic catheter.  Post procedural Dx: Same  Unsuccessful attempt of accessing SPC tract.  Since the patient's NPO status is in question we cannot perform moderate sedation.  The patient will be set up for new IR placement SPC when schedule allows.   EBL: None  Complications: None immediate  Marie Banner, MD Pager #: [phone]

## 2023-10-25 NOTE — ED Provider Notes (Signed)
 [place] EMERGENCY DEPARTMENT AT [facility] Provider Note   CSN: [id number] Arrival date & time: 10/25/23  9061     Patient presents with: SUPRAPUBIC CATH (pulled)   Marie Mitchell is a 89 y.o. female.   HPI    Patient has no complaints at this point time.  Denies any abdominal pain.  No fever no chills.  Per Daughter: Had suprapubic catheter on 8/1. History of regular foley cath but was getting irritated and hard to staff to change. Urologist recommended suprapubic because of this and this is why this was placed. Had a regular foley cath for two years. Hd foley pulled at around 8 am or overnight not clear.    Prior to Admission medications   Medication Sig Start Date End Date Taking? Authorizing Provider  acetaminophen  (TYLENOL ) 500 MG tablet Take 500 mg by mouth every 6 (six) hours as needed (for pain).   Yes [provider]  aspirin  EC 81 MG tablet Take 1 tablet (81 mg total) by mouth daily. Swallow whole. 08/18/21  Yes Bryn Bernardino NOVAK, MD  atorvastatin  (LIPITOR) 40 MG tablet Take 1 tablet (40 mg total) by mouth daily. 08/18/21  Yes Bryn Bernardino NOVAK, MD  azelastine (OPTIVAR) 0.05 % ophthalmic solution Place 2 drops into both eyes daily.   Yes [provider]  calcium  carbonate (OSCAL) 1500 (600 Ca) MG TABS tablet Take 600 mg of elemental calcium  by mouth 2 (two) times daily with a meal.   Yes [provider]  Carboxymethylcellulose Sodium (ARTIFICIAL TEARS OP) Place 1 drop into both eyes in the morning and at bedtime.   Yes [provider]  Cholecalciferol  (VITAMIN D) 50 MCG (2000 UT) CAPS Take 2,000 Units by mouth daily.   Yes [provider]  FLUoxetine  (PROZAC ) 40 MG capsule Take 40 mg by mouth daily.   Yes [provider]  hydrocortisone cream 1 % Apply 1 Application topically 2 (two) times daily as needed for itching (rash).   Yes [provider]  LORazepam (ATIVAN) 0.5 MG tablet Take 0.5 mg by mouth at  bedtime.   Yes [provider]  mirabegron ER (MYRBETRIQ) 25 MG TB24 tablet Take 25 mg by mouth at bedtime.   Yes [provider]  polyethylene glycol (MIRALAX  / GLYCOLAX ) 17 g packet Take 17 g by mouth daily. 08/27/21  Yes Love, Sharlet RAMAN, PA-C  sodium chloride  (OCEAN) 0.65 % SOLN nasal spray Place 1 spray into both nostrils as needed for congestion. Patient taking differently: Place 1 spray into both nostrils 2 (two) times daily as needed for congestion. 08/27/21  Yes Love, Sharlet RAMAN, PA-C  traMADol  (ULTRAM ) 50 MG tablet Take 1 tablet (50 mg total) by mouth 2 (two) times daily as needed for severe pain. Patient taking differently: Take 50 mg by mouth 2 (two) times daily. 08/27/21  Yes Love, Sharlet RAMAN, PA-C  traZODone  (DESYREL ) 100 MG tablet Take 100 mg by mouth at bedtime.   Yes [provider]  Turmeric (QC TUMERIC COMPLEX) 500 MG CAPS Take 500 mg by mouth daily.   Yes [provider]  UNABLE TO FIND Take 1 Bottle by mouth in the morning, at noon, and at bedtime. Med Name: House supplement   Yes [provider]  vitamin B-12 (CYANOCOBALAMIN) 1000 MCG tablet Take 1,000 mcg by mouth daily.   Yes [provider]    Allergies: Bacitracin    Review of Systems  Constitutional:  Negative for chills and fever.  HENT:  Negative for ear pain and sore throat.   Eyes:  Negative for pain and visual disturbance.  Respiratory:  Negative for cough and shortness of breath.   Cardiovascular:  Negative for chest pain and palpitations.  Gastrointestinal:  Negative for abdominal pain and vomiting.  Genitourinary:  Negative for dysuria and hematuria.  Musculoskeletal:  Negative for arthralgias and back pain.  Skin:  Negative for color change and rash.  Neurological:  Negative for seizures and syncope.  All other systems reviewed and are negative.   Updated Vital Signs BP (!) 153/88 (BP Location: Left Arm)   Pulse 80   Temp 98.9 F (37.2 C) (Axillary)   Resp  15   Ht 5' 7 (1.702 m)   Wt 59 kg   SpO2 98%   BMI 20.37 kg/m   Physical Exam Vitals and nursing note reviewed.  Constitutional:      General: She is not in acute distress.    Appearance: She is well-developed.  HENT:     Head: Normocephalic and atraumatic.  Eyes:     Conjunctiva/sclera: Conjunctivae normal.  Cardiovascular:     Rate and Rhythm: Normal rate and regular rhythm.     Heart sounds: No murmur heard. Pulmonary:     Effort: Pulmonary effort is normal. No respiratory distress.     Breath sounds: Normal breath sounds.  Abdominal:     Palpations: Abdomen is soft.     Tenderness: There is no abdominal tenderness.   Musculoskeletal:        General: No swelling.     Cervical back: Neck supple.  Skin:    General: Skin is warm and dry.     Capillary Refill: Capillary refill takes less than 2 seconds.  Neurological:     Mental Status: She is alert.  Psychiatric:        Mood and Affect: Mood normal.     (all labs ordered are listed, but only abnormal results are displayed) Labs Reviewed  CBC WITH DIFFERENTIAL/PLATELET - Abnormal; Notable for the following components:      Result Value   RBC 3.64 (*)    Hemoglobin 10.5 (*)    HCT 33.4 (*)    RDW 17.0 (*)    Lymphs Abs 0.4 (*)    All other components within normal limits  COMPREHENSIVE METABOLIC PANEL WITH GFR - Abnormal; Notable for the following components:   Total Protein 6.2 (*)    Albumin 2.9 (*)    All other components within normal limits    EKG: None  Radiology: IR CYSTOSTOMY TUBE EXCHANGE SIMPLE W IMG Result Date: 10/25/2023 INDICATION: Recently placed suprapubic catheter accidentally removed. The patient is a resident of a nursing home and the tube was found on the floor this morning. Unknown condition of removal. The patient's NPO status is questionable as the patient and the nursing home are unable to verify when the patient last ate EXAM: Sinogram MEDICATIONS: The patient is currently admitted to  the hospital and receiving intravenous antibiotics. The antibiotics were administered within an appropriate time frame prior to the initiation of the procedure. ANESTHESIA/SEDATION: Moderate (conscious) sedation was employed during this procedure. A total of Benadryl  12.5 mg and Fentanyl  100 mcg was administered intravenously by the radiology nurse. Total intra-service moderate Sedation Time: 0 minutes. The patient's level of consciousness and vital signs were monitored continuously by radiology nursing throughout the procedure under my direct supervision. COMPLICATIONS: None immediate. PROCEDURE: Informed written consent was obtained from the patient after a thorough  discussion of the procedural risks, benefits and alternatives. All questions were addressed. Maximal Sterile Barrier Technique was utilized including caps, mask, sterile gowns, sterile gloves, sterile drape, hand hygiene and skin antiseptic. A timeout was performed prior to the initiation of the procedure. A total of 50 mL of Omnipaque  300 contrast material was injected through the Foley catheter or glide catheter during the procedure. Under fluoroscopic guidance, an angled glide catheter and glidewire were advanced in the SP C ostomy and advanced within the soft tissue in order to attempt accessing the tract towards the urinary bladder. Multiple attempts and directions were attempted, however could not gain access into the urinary bladder with fluoroscopy. IMPRESSION: Unsuccessful attempt at re-accessing [facility] tract. Will attempt new access for a new SPC at a later date when the patient can have moderate sedation and n.p.o. status can be verified. Electronically Signed   By: Cordella Banner   On: 10/25/2023 13:24     Procedures   Medications Ordered in the ED  iohexol  (OMNIPAQUE ) 300 MG/ML solution 50 mL (50 mLs Per Tube Contrast Given 10/25/23 1234)  lidocaine  (XYLOCAINE ) 2 % viscous mouth solution 15 mL (7 mLs Mouth/Throat Given 10/25/23 1235)   fentaNYL  (SUBLIMAZE ) injection (25 mcg Intravenous Given 10/25/23 1220)  diphenhydrAMINE  (BENADRYL ) injection (12.5 mg Intravenous Given 10/25/23 1205)                                    Medical Decision Making Amount and/or Complexity of Data Reviewed Labs: ordered.    Per Daughter: Had suprapubic catheter on 8/1. History of regualr foley cath but was getting irritated and hard to staff to change. Urologist recommended suprapubic because of this and this is why this was placed. Had a regular foley cath for two years. Hd foley pulled at around 8 am or overnight not clear.      Patient has no complaints at this point time.  Denies any abdominal pain.  No fever no chills.  Per Daughter: Had suprapubic catheter on 8/1. History of regular foley cath but was getting irritated and hard to staff to change. Urologist recommended suprapubic because of this and this is why this was placed. Had a regular foley cath for two years. Hd foley pulled at around 8 am or overnight not clear.   Patient alert to self but not time or place.  This is patient's baseline.  Patient has no pain to palpation abdomen.  Soft and benign abdomen.  Patient has small amount of dried blood surrounding where the suprapubic catheter used to be.  Obtain laboratory workup.  No AKI.  No leukocytosis.   Despite to interventional radiology.  They try to attempt to replace the suprapubic Foley.  Unsuccessful.  Foley was then placed transurethral.  Urine was able be drained.  IR will set up outpatient suprapubic Foley replacement.  Patient will be sent back to facility until this is scheduled.        Final diagnoses:  Suprapubic catheter dysfunction, initial encounter [facility])    ED Discharge Orders     None          Simon Lavonia SAILOR, MD 10/25/23 1400

## 2023-10-25 NOTE — ED Triage Notes (Addendum)
 Pt brought from [facility] care facility. Pt pulled her suprapubic catheter out this morning. EMS states that this happens frequently. Pt is confused and does not speak much. Pt whispers a few words that are not clear. Pt lower extremities are contracted and pt is bed bound.

## 2023-10-29 ENCOUNTER — Other Ambulatory Visit: Payer: Self-pay | Admitting: Student

## 2023-10-29 DIAGNOSIS — R339 Retention of urine, unspecified: Secondary | ICD-10-CM

## 2023-10-30 ENCOUNTER — Ambulatory Visit (HOSPITAL_COMMUNITY)
Admission: RE | Admit: 2023-10-30 | Discharge: 2023-10-30 | Disposition: A | Discharge status: Home / Self Care | Source: Ambulatory Visit | Attending: Student | Admitting: Student

## 2023-10-30 DIAGNOSIS — R339 Retention of urine, unspecified: Secondary | ICD-10-CM | POA: Insufficient documentation

## 2023-10-30 HISTORY — PX: IR CYSTOSTOMY TUBE PLACEMENT/BLADDER ASPIRATION: IMG1097

## 2023-10-30 LAB — CBC
HCT: 33.2 % — ABNORMAL LOW (ref 36.0–46.0)
Hemoglobin: 10.3 g/dL — ABNORMAL LOW (ref 12.0–15.0)
MCH: 28.5 pg (ref 26.0–34.0)
MCHC: 31 g/dL (ref 30.0–36.0)
MCV: 92 fL (ref 80.0–100.0)
Platelets: 378 K/uL (ref 150–400)
RBC: 3.61 MIL/uL — ABNORMAL LOW (ref 3.87–5.11)
RDW: 16.5 % — ABNORMAL HIGH (ref 11.5–15.5)
WBC: 4.9 K/uL (ref 4.0–10.5)
nRBC: 0 % (ref 0.0–0.2)

## 2023-10-30 LAB — PROTIME-INR
INR: 1 (ref 0.8–1.2)
Prothrombin Time: 13.3 s (ref 11.4–15.2)

## 2023-10-30 MED ORDER — IOHEXOL 300 MG/ML  SOLN
50.0000 mL | Freq: Once | INTRAMUSCULAR | Status: AC | PRN
  Administered 2023-10-30: 20 mL

## 2023-10-30 MED ORDER — FENTANYL CITRATE (PF) 100 MCG/2ML IJ SOLN
INTRAMUSCULAR | Status: AC
  Filled 2023-10-30: qty 2

## 2023-10-30 MED ORDER — FENTANYL CITRATE (PF) 100 MCG/2ML IJ SOLN
INTRAMUSCULAR | Status: AC | PRN
  Administered 2023-10-30: 50 ug via INTRAVENOUS
  Administered 2023-10-30 (×2): 25 ug via INTRAVENOUS

## 2023-10-30 MED ORDER — MIDAZOLAM HCL 2 MG/2ML IJ SOLN
INTRAMUSCULAR | Status: AC | PRN
Start: 2023-10-30 — End: 2023-10-30
  Administered 2023-10-30: .5 mg via INTRAVENOUS
  Administered 2023-10-30: 1 mg via INTRAVENOUS

## 2023-10-30 MED ORDER — SODIUM CHLORIDE 0.9 % IV SOLN: INTRAVENOUS | Status: DC

## 2023-10-30 MED ORDER — LIDOCAINE HCL 1 % IJ SOLN
INTRAMUSCULAR | Status: AC
  Filled 2023-10-30: qty 20

## 2023-10-30 MED ORDER — LIDOCAINE VISCOUS HCL 2 % MT SOLN
OROMUCOSAL | Status: AC
  Filled 2023-10-30: qty 15

## 2023-10-30 MED ORDER — LIDOCAINE HCL 1 % IJ SOLN
20.0000 mL | Freq: Once | INTRAMUSCULAR | Status: AC
  Administered 2023-10-30: 10 mL via INTRADERMAL

## 2023-10-30 MED ORDER — MIDAZOLAM HCL 2 MG/2ML IJ SOLN
INTRAMUSCULAR | Status: AC
  Filled 2023-10-30: qty 2

## 2023-10-30 NOTE — Procedures (Signed)
 Interventional Radiology Procedure Note  Procedure: US  AND FLUORO 16 FR SP TUBE PLACEMENT    Complications: None  Estimated Blood Loss:  MIN  Findings: 16 FR COUNCIL TIP SP TUBE    EMERSON FREDERIC SPECKING, MD

## 2023-10-30 NOTE — H&P (Signed)
 Chief Complaint: Patient was seen in consultation today for neurogenic bladder with urinary retention, with consideration for suprapubic catheter placement.  Referring Provider(s): Dr. Velta Shank, MD   Supervising Physician: Vanice Revel  Patient Status: [name] - Out-pt  Patient is Full Code. She has had a DNR at facility but POA would like it rescinded for procedure today.   History of Present Illness: Marie Mitchell is a 89 y.o. female  who is a resident at [facility]. She has a history of osteoporosis, frequent falls, stroke, dementia, bladder areflexia with chronic foley use.   Patient is know to IR service, having most recently undergone an unsuccessful attempt at exchanging a displaced SP tube on 8/3 by Dr. Jenna.  Patient is followed by Dr. Shank with Urology service. She has a known history of neurogenic bladder with urinary retention requiring chronic foley catheter placement. She was recommended for and received an SP tube on 8/1 by Dr. Johann, who approved procedure. Unfortunately, it was found displaced/ removed by SNF staff the next day. An attempt to exchange it was unsuccessful. Patient presents for new placement today.   Her daughter who is her POA is at bedside for discussion and exam.    Patient is somnolent and laying in bed, calm. She is not alert at baseline, and ROS cannot be obtained.   No past medical history on file.  Past Surgical History:  Procedure Laterality Date   IR CYSTOSTOMY TUBE CHANGE COMPLICATED W IMG  10/25/2023   IR CYSTOSTOMY TUBE PLACEMENT/BLADDER ASPIRATION  10/23/2023    Allergies: Bacitracin  Medications: Prior to Admission medications   Medication Sig Start Date End Date Taking? Authorizing Provider  atorvastatin  (LIPITOR) 40 MG tablet Take 1 tablet (40 mg total) by mouth daily. 08/18/21  Yes Bryn Bernardino NOVAK, MD  azelastine (OPTIVAR) 0.05 % ophthalmic solution Place 2 drops into both eyes daily.   Yes [provider]   calcium  carbonate (OSCAL) 1500 (600 Ca) MG TABS tablet Take 600 mg of elemental calcium  by mouth 2 (two) times daily with a meal.   Yes [provider]  Carboxymethylcellulose Sodium (ARTIFICIAL TEARS OP) Place 1 drop into both eyes in the morning and at bedtime.   Yes [provider]  Cholecalciferol  (VITAMIN D) 50 MCG (2000 UT) CAPS Take 2,000 Units by mouth daily.   Yes [provider]  FLUoxetine  (PROZAC ) 40 MG capsule Take 40 mg by mouth daily.   Yes [provider]  LORazepam (ATIVAN) 0.5 MG tablet Take 0.5 mg by mouth at bedtime.   Yes [provider]  mirabegron ER (MYRBETRIQ) 25 MG TB24 tablet Take 25 mg by mouth at bedtime.   Yes [provider]  polyethylene glycol (MIRALAX  / GLYCOLAX ) 17 g packet Take 17 g by mouth daily. 08/27/21  Yes Love, Sharlet RAMAN, PA-C  traMADol  (ULTRAM ) 50 MG tablet Take 1 tablet (50 mg total) by mouth 2 (two) times daily as needed for severe pain. Patient taking differently: Take 50 mg by mouth 2 (two) times daily. 08/27/21  Yes Love, Sharlet RAMAN, PA-C  traZODone  (DESYREL ) 100 MG tablet Take 100 mg by mouth at bedtime.   Yes [provider]  Turmeric (QC TUMERIC COMPLEX) 500 MG CAPS Take 500 mg by mouth daily.   Yes [provider]  UNABLE TO FIND Take 1 Bottle by mouth in the morning, at noon, and at bedtime. Med Name: House supplement   Yes [provider]  vitamin B-12 (CYANOCOBALAMIN)  1000 MCG tablet Take 1,000 mcg by mouth daily.   Yes [provider]  acetaminophen  (TYLENOL ) 500 MG tablet Take 500 mg by mouth every 6 (six) hours as needed (for pain).    [provider]  aspirin  EC 81 MG tablet Take 1 tablet (81 mg total) by mouth daily. Swallow whole. 08/18/21   Bryn Bernardino NOVAK, MD  hydrocortisone cream 1 % Apply 1 Application topically 2 (two) times daily as needed for itching (rash).    [provider]  sodium chloride  (OCEAN) 0.65 % SOLN nasal spray Place 1  spray into both nostrils as needed for congestion. Patient taking differently: Place 1 spray into both nostrils 2 (two) times daily as needed for congestion. 08/27/21   Love, Sharlet RAMAN, PA-C     No family history on file.  Social History   Socioeconomic History   Marital status: Widowed    Spouse name: Not on file   Number of children: Not on file   Years of education: Not on file   Highest education level: Not on file  Occupational History   Not on file  Tobacco Use   Smoking status: Never   Smokeless tobacco: Never  Vaping Use   Vaping status: Never Used  Substance and Sexual Activity   Alcohol use: Yes    Comment: occasional wine   Drug use: Never   Sexual activity: Not Currently  Other Topics Concern   Not on file  Social History Narrative   Not on file   Social Drivers of Health   Financial Resource Strain: Low Risk  (06/20/2021)   Received from [facility]   Overall Financial Resource Strain (CARDIA)    Difficulty of Paying Living Expenses: Not very hard  Food Insecurity: No Food Insecurity (06/20/2021)   Received from [facility]   Hunger Vital Sign    Within the past 12 months, you worried that your food would run out before you got the money to buy more.: Never true    Within the past 12 months, the food you bought just didn't last and you didn't have money to get more.: Never true  Transportation Needs: No Transportation Needs (05/11/2021)   Received from [facility] - Transportation    Lack of Transportation (Medical): No    Lack of Transportation (Non-Medical): No  Physical Activity: Insufficiently Active (06/20/2021)   Received from [facility]   Exercise Vital Sign    On average, how many days per week do you engage in moderate to strenuous exercise (like a brisk walk)?: 3 days    On average, how many minutes do you engage in exercise at this level?: 20 min  Stress: Stress Concern Present (06/20/2021)   Received from [facility] - Occupational Stress Questionnaire    Feeling of Stress : Rather much  Social Connections: Unknown (07/06/2022)   Received from [facility]   Social Network    Social Network: Not on file     Review of Systems: A 12 point ROS discussed and pertinent positives are indicated in the HPI above.  All other systems are negative.  Vital Signs: BP 126/61   Pulse 65   Temp 97.7 F (36.5 C) (Oral)   Resp 16   Ht 5' 7 (1.702 m)   Wt 145 lb (65.8 kg)   SpO2 94%   BMI 22.71 kg/m   Advance Care Plan: The advanced care place/surrogate decision maker was  discussed at the time of visit and the patient did not wish to discuss or was not able to name a surrogate decision maker or provide an advance care plan.  Physical Exam Constitutional:      Appearance: Normal appearance.     Comments: Patient is not alert, somnolent. Patient does not respond to commands.  HENT:     Mouth/Throat:     Comments: Patient does not respond to commands. Cardiovascular:     Rate and Rhythm: Normal rate and regular rhythm.     Pulses: Normal pulses.     Heart sounds: Normal heart sounds.  Pulmonary:     Effort: Pulmonary effort is normal.     Breath sounds: Normal breath sounds.  Abdominal:     General: Abdomen is flat.     Palpations: Abdomen is soft.  Skin:    General: Skin is warm and dry.  Neurological:     Mental Status: Mental status is at baseline. She is disoriented.     Comments: Patient is not alert, somnolent. Patient does not respond to commands.  Psychiatric:     Comments: Patient is not alert, somnolent. Patient does not respond to commands.     Imaging: IR CYSTOSTOMY TUBE EXCHANGE SIMPLE W IMG Result Date: 10/25/2023 INDICATION: Recently placed suprapubic catheter accidentally removed. The patient is a resident of a nursing home and the tube was found on the floor this morning. Unknown condition of removal. The patient's NPO status is  questionable as the patient and the nursing home are unable to verify when the patient last ate EXAM: Sinogram MEDICATIONS: The patient is currently admitted to the hospital and receiving intravenous antibiotics. The antibiotics were administered within an appropriate time frame prior to the initiation of the procedure. ANESTHESIA/SEDATION: Moderate (conscious) sedation was employed during this procedure. A total of Benadryl  12.5 mg and Fentanyl  100 mcg was administered intravenously by the radiology nurse. Total intra-service moderate Sedation Time: 0 minutes. The patient's level of consciousness and vital signs were monitored continuously by radiology nursing throughout the procedure under my direct supervision. COMPLICATIONS: None immediate. PROCEDURE: Informed written consent was obtained from the patient after a thorough discussion of the procedural risks, benefits and alternatives. All questions were addressed. Maximal Sterile Barrier Technique was utilized including caps, mask, sterile gowns, sterile gloves, sterile drape, hand hygiene and skin antiseptic. A timeout was performed prior to the initiation of the procedure. A total of 50 mL of Omnipaque  300 contrast material was injected through the Foley catheter or glide catheter during the procedure. Under fluoroscopic guidance, an angled glide catheter and glidewire were advanced in the SP C ostomy and advanced within the soft tissue in order to attempt accessing the tract towards the urinary bladder. Multiple attempts and directions were attempted, however could not gain access into the urinary bladder with fluoroscopy. IMPRESSION: Unsuccessful attempt at re-accessing [facility] tract. Will attempt new access for a new SPC at a later date when the patient can have moderate sedation and n.p.o. status can be verified. Electronically Signed   By: Cordella Banner   On: 10/25/2023 13:24   IR CYSTOSTOMY TUBE PLACEMENT/BLADDER ASPIRATION Result Date:  10/23/2023 INDICATION: Areflexive urinary bladder, Foley catheter dependent EXAM: FLUOROSCOPIC GUIDED SUPRAPUBIC CATHETER PLACEMENT COMPARISON:  None MEDICATIONS: Lidocaine  1% subcutaneous ANESTHESIA/SEDATION: Intravenous Fentanyl  25mcg and Versed  1mg  were administered by RN during a total moderate (conscious) sedation time of 10 minutes; the patient's level of consciousness and physiological / cardiorespiratory status were monitored continuously by radiology RN  under my direct supervision. CONTRAST:  20 mL Omnipaque  180 into the urinary bladder FLUOROSCOPY: Radiation Exposure Index (as provided by the fluoroscopic device): 8 MGy air Kerma COMPLICATIONS: None immediate. PROCEDURE: The procedure, risks, benefits, and alternatives were explained to the patient. Questions regarding the procedure were encouraged and answered. The patient understands and consents to the procedure. A timeout was performed prior to the initiation of the procedure. Ultrasound performed to confirm appropriate suprapubic approach. The patient was positioned supine. Pre-procedural imaging was obtained of the lower pelvis. The skin overlying the anterior aspect the lower pelvis was prepped and draped in usual sterile fashion. The overlying soft tissues were anesthetized with 1% lidocaine . Next, an 18 gauge needle was advanced into the urinary bladder under direct fluoroscopic guidance. Ultrasound image was saved for procedural documentation purposes. A short Amplatz wire was coiled within the urinary bladder. Appropriate position was confirmed with a fluoroscopy. Under direct fluoroscopic guidance, a 8 x 40 mm balloon was advanced over the wire and insufflated to effacement. The balloon was released, allowing placement of a 16 Fr Council-tip suprapubic cystostomy drainage catheter with end locked within the urinary bladder. The catheter was connected to a gravity bag. The exit site of the catheter was secured within interrupted suture. A  dressing was placed. The patient tolerated the procedure well without immediate postprocedural complication. FINDINGS: * fluoroscopy was utilized for the placement of a 16 Fr Council-tip percutaneous suprapubic cystostomy catheter with end coiled and locked within the urinary bladder. IMPRESSION: Successful ultrasound and fluoroscopic-guided placement of a 16 Fr Council-tip suprapubic catheter. PLAN: Routine suprapubic catheter exchanges by the Urology service. Electronically Signed   By: JONETTA Faes M.D.   On: 10/23/2023 12:51    Labs:  CBC: Recent Labs    10/23/23 0940 10/25/23 1025 10/30/23 0932  WBC 6.3 7.8 4.9  HGB 11.3* 10.5* 10.3*  HCT 36.2 33.4* 33.2*  PLT 313 284 378    COAGS: Recent Labs    10/23/23 0940 10/30/23 0932  INR 0.9 1.0    BMP: Recent Labs    10/23/23 0940 10/25/23 1025  NA 140 139  K 4.1 3.8  CL 105 106  CO2 27 25  GLUCOSE 83 93  BUN 15 20  CALCIUM  8.9 8.9  CREATININE 0.88 0.85  GFRNONAA >60 >60    LIVER FUNCTION TESTS: Recent Labs    10/25/23 1025  BILITOT 0.7  AST 31  ALT 40  ALKPHOS 73  PROT 6.2*  ALBUMIN 2.9*    TUMOR MARKERS: No results for input(s): AFPTM, CEA, CA199, CHROMGRNA in the last 8760 hours.  Assessment and Plan: Patient is followed by Dr. Elisabeth with Urology service. She has a known history of neurogenic bladder with urinary retention requiring chronic foley catheter placement. She was recommended for and received an SP tube on 8/1 by Dr. Faes, who approved procedure. Unfortunately, it was found displaced/ removed by SNF staff the next day. An attempt to exchange it was unsuccessful. Patient presents for scheduled new placement today in IR today.  Patient has been NPO since midnight.  All labs and medications are within acceptable parameters.  No pertinent allergies.   Risks and benefits of suprapubic catheter placement were discussed with the patient's daughter/POA, including bleeding, infection, damage to  adjacent structures, bladder perforation/fistula connection, and sepsis.  All questions were answered, and POA/daughter is agreeable to proceed. Consent signed and in chart.     Thank you for allowing our service to participate in Marie Mitchell 's  care.  Electronically Signed: Carlin DELENA Griffon, PA-C   10/30/2023, 10:28 AM      I spent a total of  25 Minutes in face to face in clinical consultation, greater than 50% of which was counseling/coordinating care for neurogenic bladder with urinary retention, with consideration for suprapubic catheter placement.

## 2023-11-11 ENCOUNTER — Telehealth (HOSPITAL_COMMUNITY): Payer: Self-pay

## 2023-11-11 NOTE — Telephone Encounter (Signed)
 Received order for sp tube change/upsize. Checked with our PA Jesusa O.), pt had a council tip placed already and does not need an upsize. Can f/u with urology. I called and informed office. AB

## 2023-12-02 ENCOUNTER — Other Ambulatory Visit (HOSPITAL_COMMUNITY): Payer: Self-pay | Admitting: Interventional Radiology

## 2023-12-02 DIAGNOSIS — R339 Retention of urine, unspecified: Secondary | ICD-10-CM

## 2023-12-11 ENCOUNTER — Other Ambulatory Visit: Payer: Self-pay

## 2023-12-11 DIAGNOSIS — Z01818 Encounter for other preprocedural examination: Secondary | ICD-10-CM

## 2023-12-13 ENCOUNTER — Other Ambulatory Visit (HOSPITAL_COMMUNITY): Payer: Self-pay | Admitting: Student

## 2023-12-14 ENCOUNTER — Other Ambulatory Visit: Payer: Self-pay

## 2023-12-14 ENCOUNTER — Ambulatory Visit (HOSPITAL_COMMUNITY)
Admission: RE | Admit: 2023-12-14 | Discharge: 2023-12-14 | Disposition: A | Discharge status: Home / Self Care | Source: Ambulatory Visit | Attending: Family Medicine | Admitting: Family Medicine

## 2023-12-14 DIAGNOSIS — R339 Retention of urine, unspecified: Secondary | ICD-10-CM | POA: Diagnosis not present

## 2023-12-14 DIAGNOSIS — Z435 Encounter for attention to cystostomy: Secondary | ICD-10-CM | POA: Insufficient documentation

## 2023-12-14 HISTORY — PX: IR CYSTOSTOMY TUBE CHANGE COMPLICATED W IMG: IMG1098

## 2023-12-14 MED ORDER — LIDOCAINE VISCOUS HCL 2 % MT SOLN
OROMUCOSAL | Status: AC
  Filled 2023-12-14: qty 15

## 2023-12-14 MED ORDER — IOHEXOL 300 MG/ML  SOLN
50.0000 mL | Freq: Once | INTRAMUSCULAR | Status: AC | PRN
  Administered 2023-12-14: 10 mL

## 2023-12-15 ENCOUNTER — Other Ambulatory Visit (HOSPITAL_COMMUNITY): Payer: Self-pay | Admitting: Interventional Radiology

## 2023-12-15 DIAGNOSIS — R339 Retention of urine, unspecified: Secondary | ICD-10-CM
